# Patient Record
Sex: Female | Born: 1950
Health system: Southern US, Community
[De-identification: ages and names within clinical notes are randomized; demographics above are authoritative.]

## PROBLEM LIST (undated history)

## (undated) DIAGNOSIS — E785 Hyperlipidemia, unspecified: Secondary | ICD-10-CM

## (undated) DIAGNOSIS — G43909 Migraine, unspecified, not intractable, without status migrainosus: Secondary | ICD-10-CM

## (undated) DIAGNOSIS — I82409 Acute embolism and thrombosis of unspecified deep veins of unspecified lower extremity: Secondary | ICD-10-CM

## (undated) DIAGNOSIS — F32A Depression, unspecified: Secondary | ICD-10-CM

## (undated) DIAGNOSIS — IMO0002 Reserved for concepts with insufficient information to code with codable children: Secondary | ICD-10-CM

## (undated) DIAGNOSIS — I2699 Other pulmonary embolism without acute cor pulmonale: Secondary | ICD-10-CM

## (undated) DIAGNOSIS — F329 Major depressive disorder, single episode, unspecified: Secondary | ICD-10-CM

## (undated) DIAGNOSIS — D6851 Activated protein C resistance: Secondary | ICD-10-CM

## (undated) HISTORY — DX: Hyperlipidemia, unspecified: E78.5

## (undated) HISTORY — PX: KNEE ARTHROSCOPY: SUR90

## (undated) HISTORY — DX: Migraine, unspecified, not intractable, without status migrainosus: G43.909

## (undated) HISTORY — PX: DILATION AND CURETTAGE OF UTERUS: SHX78

## (undated) HISTORY — DX: Reserved for concepts with insufficient information to code with codable children: IMO0002

## (undated) HISTORY — DX: Major depressive disorder, single episode, unspecified: F32.9

## (undated) HISTORY — DX: Depression, unspecified: F32.A

## (undated) HISTORY — PX: TONSILLECTOMY: SUR1361

## (undated) HISTORY — PX: OTHER SURGICAL HISTORY: SHX169

---

## 1998-01-26 ENCOUNTER — Other Ambulatory Visit: Admission: RE | Admit: 1998-01-26 | Discharge: 1998-01-26 | Payer: Self-pay | Admitting: Obstetrics and Gynecology

## 1999-03-10 ENCOUNTER — Other Ambulatory Visit: Admission: RE | Admit: 1999-03-10 | Discharge: 1999-03-10 | Payer: Self-pay | Admitting: Obstetrics and Gynecology

## 2000-03-13 ENCOUNTER — Encounter: Admission: RE | Admit: 2000-03-13 | Discharge: 2000-03-13 | Payer: Self-pay | Admitting: Family Medicine

## 2000-03-13 ENCOUNTER — Encounter: Payer: Self-pay | Admitting: Family Medicine

## 2000-05-08 ENCOUNTER — Other Ambulatory Visit: Admission: RE | Admit: 2000-05-08 | Discharge: 2000-05-08 | Payer: Self-pay | Admitting: Obstetrics & Gynecology

## 2001-03-15 ENCOUNTER — Encounter: Payer: Self-pay | Admitting: Family Medicine

## 2001-03-15 ENCOUNTER — Encounter: Admission: RE | Admit: 2001-03-15 | Discharge: 2001-03-15 | Payer: Self-pay | Admitting: Family Medicine

## 2001-08-12 ENCOUNTER — Other Ambulatory Visit: Admission: RE | Admit: 2001-08-12 | Discharge: 2001-08-12 | Payer: Self-pay | Admitting: Obstetrics & Gynecology

## 2001-09-19 ENCOUNTER — Ambulatory Visit (HOSPITAL_COMMUNITY): Admission: RE | Admit: 2001-09-19 | Discharge: 2001-09-19 | Payer: Self-pay | Admitting: Obstetrics and Gynecology

## 2001-09-19 ENCOUNTER — Encounter (INDEPENDENT_AMBULATORY_CARE_PROVIDER_SITE_OTHER): Payer: Self-pay

## 2002-03-17 ENCOUNTER — Encounter: Admission: RE | Admit: 2002-03-17 | Discharge: 2002-03-17 | Payer: Self-pay | Admitting: Obstetrics and Gynecology

## 2002-03-17 ENCOUNTER — Encounter: Payer: Self-pay | Admitting: Obstetrics and Gynecology

## 2002-08-19 ENCOUNTER — Other Ambulatory Visit: Admission: RE | Admit: 2002-08-19 | Discharge: 2002-08-19 | Payer: Self-pay | Admitting: Obstetrics and Gynecology

## 2002-09-19 ENCOUNTER — Encounter (INDEPENDENT_AMBULATORY_CARE_PROVIDER_SITE_OTHER): Payer: Self-pay

## 2002-09-19 ENCOUNTER — Ambulatory Visit (HOSPITAL_COMMUNITY): Admission: RE | Admit: 2002-09-19 | Discharge: 2002-09-19 | Payer: Self-pay | Admitting: Obstetrics and Gynecology

## 2003-04-14 ENCOUNTER — Encounter: Admission: RE | Admit: 2003-04-14 | Discharge: 2003-04-14 | Payer: Self-pay | Admitting: Obstetrics and Gynecology

## 2003-04-14 ENCOUNTER — Encounter: Payer: Self-pay | Admitting: Obstetrics and Gynecology

## 2003-08-20 ENCOUNTER — Other Ambulatory Visit: Admission: RE | Admit: 2003-08-20 | Discharge: 2003-08-20 | Payer: Self-pay | Admitting: Obstetrics and Gynecology

## 2004-09-07 ENCOUNTER — Other Ambulatory Visit: Admission: RE | Admit: 2004-09-07 | Discharge: 2004-09-07 | Payer: Self-pay | Admitting: Obstetrics and Gynecology

## 2005-10-31 ENCOUNTER — Other Ambulatory Visit: Admission: RE | Admit: 2005-10-31 | Discharge: 2005-10-31 | Payer: Self-pay | Admitting: Obstetrics and Gynecology

## 2006-01-04 ENCOUNTER — Ambulatory Visit (HOSPITAL_COMMUNITY): Payer: Self-pay | Admitting: *Deleted

## 2006-04-10 ENCOUNTER — Ambulatory Visit (HOSPITAL_COMMUNITY): Payer: Self-pay | Admitting: *Deleted

## 2006-07-09 ENCOUNTER — Encounter: Payer: Self-pay | Admitting: Family Medicine

## 2006-08-06 ENCOUNTER — Encounter: Payer: Self-pay | Admitting: Family Medicine

## 2008-08-26 ENCOUNTER — Encounter (INDEPENDENT_AMBULATORY_CARE_PROVIDER_SITE_OTHER): Payer: Self-pay | Admitting: *Deleted

## 2008-09-30 ENCOUNTER — Ambulatory Visit: Payer: Self-pay | Admitting: Family Medicine

## 2008-09-30 DIAGNOSIS — F329 Major depressive disorder, single episode, unspecified: Secondary | ICD-10-CM

## 2008-09-30 DIAGNOSIS — E785 Hyperlipidemia, unspecified: Secondary | ICD-10-CM | POA: Insufficient documentation

## 2008-12-11 ENCOUNTER — Encounter: Payer: Self-pay | Admitting: Family Medicine

## 2008-12-11 ENCOUNTER — Encounter (INDEPENDENT_AMBULATORY_CARE_PROVIDER_SITE_OTHER): Payer: Self-pay | Admitting: *Deleted

## 2008-12-14 ENCOUNTER — Ambulatory Visit: Payer: Self-pay | Admitting: Family Medicine

## 2008-12-14 DIAGNOSIS — H6120 Impacted cerumen, unspecified ear: Secondary | ICD-10-CM | POA: Insufficient documentation

## 2008-12-14 DIAGNOSIS — I8000 Phlebitis and thrombophlebitis of superficial vessels of unspecified lower extremity: Secondary | ICD-10-CM | POA: Insufficient documentation

## 2008-12-15 ENCOUNTER — Telehealth (INDEPENDENT_AMBULATORY_CARE_PROVIDER_SITE_OTHER): Payer: Self-pay | Admitting: *Deleted

## 2008-12-15 LAB — CONVERTED CEMR LAB
ALT: 41 units/L — ABNORMAL HIGH (ref 0–35)
AST: 31 units/L (ref 0–37)
Albumin: 4.1 g/dL (ref 3.5–5.2)
Alkaline Phosphatase: 54 units/L (ref 39–117)
BUN: 19 mg/dL (ref 6–23)
Basophils Absolute: 0 10*3/uL (ref 0.0–0.1)
Basophils Relative: 0.2 % (ref 0.0–3.0)
Bilirubin, Direct: 0.1 mg/dL (ref 0.0–0.3)
CO2: 33 meq/L — ABNORMAL HIGH (ref 19–32)
Calcium: 9.8 mg/dL (ref 8.4–10.5)
Chloride: 106 meq/L (ref 96–112)
Cholesterol: 166 mg/dL (ref 0–200)
Creatinine, Ser: 0.7 mg/dL (ref 0.4–1.2)
Eosinophils Absolute: 0.1 10*3/uL (ref 0.0–0.7)
Eosinophils Relative: 1.7 % (ref 0.0–5.0)
GFR calc non Af Amer: 91.08 mL/min (ref >60)
Glucose, Bld: 98 mg/dL (ref 70–99)
HCT: 42.6 % (ref 36.0–46.0)
HDL: 54.3 mg/dL (ref >39.00)
Hemoglobin: 14.7 g/dL (ref 12.0–15.0)
LDL Cholesterol: 92 mg/dL (ref 0–99)
Lymphocytes Relative: 36.5 % (ref 12.0–46.0)
Lymphs Abs: 2.1 10*3/uL (ref 0.7–4.0)
MCHC: 34.4 g/dL (ref 30.0–36.0)
MCV: 92.9 fL (ref 78.0–100.0)
Monocytes Absolute: 0.4 10*3/uL (ref 0.1–1.0)
Monocytes Relative: 7 % (ref 3.0–12.0)
Neutro Abs: 3.1 10*3/uL (ref 1.4–7.7)
Neutrophils Relative %: 54.6 % (ref 43.0–77.0)
Platelets: 214 10*3/uL (ref 150.0–400.0)
Potassium: 4.7 meq/L (ref 3.5–5.1)
RBC: 4.58 M/uL (ref 3.87–5.11)
RDW: 12.3 % (ref 11.5–14.6)
Sodium: 142 meq/L (ref 135–145)
TSH: 2.57 microintl units/mL (ref 0.35–5.50)
Total Bilirubin: 0.9 mg/dL (ref 0.3–1.2)
Total CHOL/HDL Ratio: 3
Total Protein: 6.5 g/dL (ref 6.0–8.3)
Triglycerides: 98 mg/dL (ref 0.0–149.0)
VLDL: 19.6 mg/dL (ref 0.0–40.0)
WBC: 5.7 10*3/uL (ref 4.5–10.5)

## 2008-12-18 ENCOUNTER — Ambulatory Visit: Payer: Self-pay

## 2008-12-18 ENCOUNTER — Encounter: Payer: Self-pay | Admitting: Family Medicine

## 2008-12-24 ENCOUNTER — Telehealth (INDEPENDENT_AMBULATORY_CARE_PROVIDER_SITE_OTHER): Payer: Self-pay | Admitting: *Deleted

## 2008-12-28 ENCOUNTER — Ambulatory Visit: Payer: Self-pay | Admitting: Family Medicine

## 2008-12-28 ENCOUNTER — Telehealth (INDEPENDENT_AMBULATORY_CARE_PROVIDER_SITE_OTHER): Payer: Self-pay | Admitting: *Deleted

## 2008-12-30 ENCOUNTER — Encounter (INDEPENDENT_AMBULATORY_CARE_PROVIDER_SITE_OTHER): Payer: Self-pay | Admitting: *Deleted

## 2008-12-30 LAB — CONVERTED CEMR LAB
ALT: 37 units/L — ABNORMAL HIGH (ref 0–35)
AST: 29 units/L (ref 0–37)
Albumin: 3.8 g/dL (ref 3.5–5.2)
Alkaline Phosphatase: 54 units/L (ref 39–117)
Bilirubin, Direct: 0 mg/dL (ref 0.0–0.3)
Total Bilirubin: 0.7 mg/dL (ref 0.3–1.2)
Total Protein: 5.9 g/dL — ABNORMAL LOW (ref 6.0–8.3)

## 2009-01-29 ENCOUNTER — Telehealth (INDEPENDENT_AMBULATORY_CARE_PROVIDER_SITE_OTHER): Payer: Self-pay | Admitting: *Deleted

## 2009-04-24 ENCOUNTER — Ambulatory Visit: Payer: Self-pay | Admitting: Family Medicine

## 2009-06-04 ENCOUNTER — Telehealth (INDEPENDENT_AMBULATORY_CARE_PROVIDER_SITE_OTHER): Payer: Self-pay | Admitting: *Deleted

## 2009-06-10 ENCOUNTER — Ambulatory Visit: Payer: Self-pay | Admitting: Family Medicine

## 2009-06-10 DIAGNOSIS — R0982 Postnasal drip: Secondary | ICD-10-CM | POA: Insufficient documentation

## 2009-06-11 LAB — CONVERTED CEMR LAB
ALT: 31 units/L (ref 0–35)
AST: 25 units/L (ref 0–37)
Albumin: 4.1 g/dL (ref 3.5–5.2)
Alkaline Phosphatase: 46 units/L (ref 39–117)
Bilirubin, Direct: 0.1 mg/dL (ref 0.0–0.3)
Cholesterol: 140 mg/dL (ref 0–200)
HDL: 58.1 mg/dL (ref >39.00)
LDL Cholesterol: 75 mg/dL (ref 0–99)
Total Bilirubin: 0.8 mg/dL (ref 0.3–1.2)
Total CHOL/HDL Ratio: 2
Total Protein: 5.9 g/dL — ABNORMAL LOW (ref 6.0–8.3)
Triglycerides: 37 mg/dL (ref 0.0–149.0)
VLDL: 7.4 mg/dL (ref 0.0–40.0)

## 2009-10-12 ENCOUNTER — Telehealth (INDEPENDENT_AMBULATORY_CARE_PROVIDER_SITE_OTHER): Payer: Self-pay | Admitting: *Deleted

## 2009-12-17 ENCOUNTER — Telehealth (INDEPENDENT_AMBULATORY_CARE_PROVIDER_SITE_OTHER): Payer: Self-pay | Admitting: *Deleted

## 2009-12-19 LAB — CONVERTED CEMR LAB: Pap Smear: NORMAL

## 2009-12-19 LAB — HM MAMMOGRAPHY: HM Mammogram: NORMAL

## 2009-12-28 ENCOUNTER — Ambulatory Visit: Payer: Self-pay | Admitting: Family Medicine

## 2009-12-28 DIAGNOSIS — S93609A Unspecified sprain of unspecified foot, initial encounter: Secondary | ICD-10-CM | POA: Insufficient documentation

## 2010-01-14 ENCOUNTER — Telehealth (INDEPENDENT_AMBULATORY_CARE_PROVIDER_SITE_OTHER): Payer: Self-pay | Admitting: *Deleted

## 2010-01-19 ENCOUNTER — Telehealth (INDEPENDENT_AMBULATORY_CARE_PROVIDER_SITE_OTHER): Payer: Self-pay | Admitting: *Deleted

## 2010-02-08 ENCOUNTER — Encounter (INDEPENDENT_AMBULATORY_CARE_PROVIDER_SITE_OTHER): Payer: Self-pay | Admitting: *Deleted

## 2010-02-08 ENCOUNTER — Encounter: Payer: Self-pay | Admitting: Family Medicine

## 2010-02-08 LAB — CONVERTED CEMR LAB
ALT: 30 units/L
AST: 23 units/L
Albumin: 4.4 g/dL
Alkaline Phosphatase: 53 units/L
BUN: 20 mg/dL
CO2, serum: 27 mmol/L
Calcium: 9.6 mg/dL
Chloride, Serum: 104 mmol/L
Creatinine, Ser: 0.75 mg/dL
Glucose, Bld: 56 mg/dL
HCT: 44.4 %
Hemoglobin: 14.5 g/dL
MCH: 30.6 pg
MCV: 93.7 fL
Platelets: 203 10*3/uL
Potassium, serum: 4.6 mmol/L
RBC count: 4.74 10*6/uL
Sodium, serum: 141 mmol/L
TSH: 3.817 microintl units/mL
Total Bilirubin: 0.6 mg/dL
Total Protein: 6.2 g/dL
WBC, blood: 6.1 10*3/uL

## 2010-02-11 ENCOUNTER — Encounter (INDEPENDENT_AMBULATORY_CARE_PROVIDER_SITE_OTHER): Payer: Self-pay | Admitting: *Deleted

## 2010-02-15 ENCOUNTER — Ambulatory Visit: Payer: Self-pay | Admitting: Family Medicine

## 2010-02-17 LAB — CONVERTED CEMR LAB
ALT: 34 units/L (ref 0–35)
AST: 23 units/L (ref 0–37)
Albumin: 4.5 g/dL (ref 3.5–5.2)
Alkaline Phosphatase: 53 units/L (ref 39–117)
BUN: 20 mg/dL (ref 6–23)
Basophils Absolute: 0 10*3/uL (ref 0.0–0.1)
Basophils Relative: 0.6 % (ref 0.0–3.0)
Bilirubin, Direct: 0.1 mg/dL (ref 0.0–0.3)
CO2: 34 meq/L — ABNORMAL HIGH (ref 19–32)
Calcium: 9.7 mg/dL (ref 8.4–10.5)
Chloride: 106 meq/L (ref 96–112)
Cholesterol: 181 mg/dL (ref 0–200)
Creatinine, Ser: 0.7 mg/dL (ref 0.4–1.2)
Eosinophils Absolute: 0.2 10*3/uL (ref 0.0–0.7)
Eosinophils Relative: 2.9 % (ref 0.0–5.0)
Free T4: 0.87 ng/dL (ref 0.60–1.60)
GFR calc non Af Amer: 90.72 mL/min (ref >60)
Glucose, Bld: 91 mg/dL (ref 70–99)
HCT: 42.2 % (ref 36.0–46.0)
HDL: 61.8 mg/dL (ref >39.00)
Hemoglobin: 14.6 g/dL (ref 12.0–15.0)
LDL Cholesterol: 102 mg/dL — ABNORMAL HIGH (ref 0–99)
Lymphocytes Relative: 43.2 % (ref 12.0–46.0)
Lymphs Abs: 2.5 10*3/uL (ref 0.7–4.0)
MCHC: 34.7 g/dL (ref 30.0–36.0)
MCV: 92.1 fL (ref 78.0–100.0)
Monocytes Absolute: 0.5 10*3/uL (ref 0.1–1.0)
Monocytes Relative: 8.2 % (ref 3.0–12.0)
Neutro Abs: 2.6 10*3/uL (ref 1.4–7.7)
Neutrophils Relative %: 45.1 % (ref 43.0–77.0)
Platelets: 211 10*3/uL (ref 150.0–400.0)
Potassium: 4.6 meq/L (ref 3.5–5.1)
RBC: 4.58 M/uL (ref 3.87–5.11)
RDW: 12.8 % (ref 11.5–14.6)
Sodium: 144 meq/L (ref 135–145)
T3, Free: 2.5 pg/mL (ref 2.3–4.2)
TSH: 5.39 microintl units/mL (ref 0.35–5.50)
Total Bilirubin: 0.6 mg/dL (ref 0.3–1.2)
Total CHOL/HDL Ratio: 3
Total Protein: 6.7 g/dL (ref 6.0–8.3)
Triglycerides: 88 mg/dL (ref 0.0–149.0)
VLDL: 17.6 mg/dL (ref 0.0–40.0)
Vit D, 25-Hydroxy: 67 ng/mL (ref 30–89)
WBC: 5.7 10*3/uL (ref 4.5–10.5)

## 2010-02-18 LAB — HM COLONOSCOPY: HM Colonoscopy: NORMAL

## 2010-02-22 ENCOUNTER — Telehealth (INDEPENDENT_AMBULATORY_CARE_PROVIDER_SITE_OTHER): Payer: Self-pay | Admitting: *Deleted

## 2010-03-28 ENCOUNTER — Encounter: Payer: Self-pay | Admitting: Family Medicine

## 2010-04-15 ENCOUNTER — Encounter: Payer: Self-pay | Admitting: Family Medicine

## 2010-05-04 ENCOUNTER — Encounter: Payer: Self-pay | Admitting: Family Medicine

## 2010-05-06 ENCOUNTER — Ambulatory Visit: Payer: Self-pay | Admitting: Family Medicine

## 2010-05-06 DIAGNOSIS — M25569 Pain in unspecified knee: Secondary | ICD-10-CM | POA: Insufficient documentation

## 2010-05-09 ENCOUNTER — Ambulatory Visit: Payer: Self-pay | Admitting: Family Medicine

## 2010-05-09 ENCOUNTER — Encounter: Admission: RE | Admit: 2010-05-09 | Discharge: 2010-05-09 | Payer: Self-pay | Admitting: Family Medicine

## 2010-05-10 ENCOUNTER — Encounter: Payer: Self-pay | Admitting: Family Medicine

## 2010-05-10 ENCOUNTER — Encounter (INDEPENDENT_AMBULATORY_CARE_PROVIDER_SITE_OTHER): Payer: Self-pay | Admitting: *Deleted

## 2010-05-12 ENCOUNTER — Encounter: Payer: Self-pay | Admitting: Family Medicine

## 2010-05-16 ENCOUNTER — Encounter (INDEPENDENT_AMBULATORY_CARE_PROVIDER_SITE_OTHER): Payer: Self-pay | Admitting: *Deleted

## 2010-05-16 ENCOUNTER — Encounter: Admission: RE | Admit: 2010-05-16 | Discharge: 2010-05-16 | Payer: Self-pay | Admitting: Family Medicine

## 2010-05-16 DIAGNOSIS — IMO0002 Reserved for concepts with insufficient information to code with codable children: Secondary | ICD-10-CM | POA: Insufficient documentation

## 2010-05-19 ENCOUNTER — Encounter: Payer: Self-pay | Admitting: Family Medicine

## 2010-09-20 NOTE — Assessment & Plan Note (Signed)
Summary: bruised ankle & swollen/cbs   Vital Signs:  Patient profile:   60 year old female Height:      63.50 inches Weight:      138 pounds BMI:     24.15 Pulse rate:   72 / minute Pulse rhythm:   regular BP sitting:   116 / 80  (left arm) Cuff size:   regular  Vitals Entered By: Army Fossa CMA (Dec 28, 2009 2:55 PM) CC: Top of pts foot and ankle is bruised and swollen, hurt it yesterday while walking her dog   History of Present Illness:  Injury      This is a 60 year old woman who presents with An injury.  The symptoms began 1 day ago.  Pt fell yesterday while walking dog.  Her Renette [place] got scared and pulled her.   Pain with weight bearing.  The patient reports injury to the right foot.  The patient also reports swelling, redness, tenderness, and increased warmth deformity.  The patient denies blood loss, numbness, weakness, and loss of sensation.  Risk factors for significant bleeding include aspirin use.  The patient denies the following risk factors for significant bleeding: anticoagulant use and history of bleeding disorder.  Screening for risk of abuse was negative.    Allergies (verified): No Known Drug Allergies  Physical Exam  General:  Well-developed,well-nourished,in no acute distress; alert,appropriate and cooperative throughout examination Msk:  R foot-- + errythema and swelling lat mall and foot just minimal tenderness with palpation Pt able to move toes and flex foot with only minimal pain Psych:  Oriented X3 and good eye contact.     Impression & Recommendations:  Problem # 1:  FOOT SPRAIN, RIGHT (ICD-845.10)  Her updated medication list for this problem includes:    Aspirin 81 Mg Tbec (Aspirin) .Marland Kitchen... Take one tablet daily  Orders: T-Foot Right (73630TC) Ace Wraps 3-5 in/yard  (V4098) EMR Misc Charge Code [facility])  Instructed to use a compression wrap, elevate the affected area, apply ICE for 20 minutes every hour while awake for next 3 days, and  rest. Start physical therapy as directed and recheck in 10-14 days if no improvement, sooner if worse.  Complete Medication List: 1)  Alprazolam 0.5 Mg Tabs (Alprazolam) .... Take one tablet qid as needed 2)  Simvastatin 40 Mg Tabs (Simvastatin) .... Take one tablet at bedtime 3)  Citalopram Hydrobromide 40 Mg Tabs (Citalopram hydrobromide) .... Take one tablet daily 4)  Biotin 1000 Mcg Tabs (Biotin) .... Take one tablet daily 5)  Aspirin 81 Mg Tbec (Aspirin) .... Take one tablet daily 6)  Multivitamins Tabs (Multiple vitamin) .Marland Kitchen.. 1 by mouth once daily 7)  Calcium 600 1500 Mg Tabs (Calcium carbonate) .Marland Kitchen.. 1 by mouth two times a day 8)  Nasonex 50 Mcg/act Susp (Mometasone furoate) .... 2 sprays each nostril once daily

## 2010-09-20 NOTE — Progress Notes (Signed)
Summary: simvastatin refill   Phone Note Refill Request Message from:  Fax from Pharmacy on October 12, 2009 4:30 PM  Refills Requested: Medication #1:  SIMVASTATIN 40 MG TABS take one tablet at bedtime [facility] fax [phone]   Method Requested: Fax to Local Pharmacy Next Appointment Scheduled: no appt Initial call taken by: Barb Merino,  October 12, 2009 4:31 PM    Prescriptions: SIMVASTATIN 40 MG TABS (SIMVASTATIN) take one tablet at bedtime  #30 x 2   Entered by:   Doristine Devoid   Authorized by:   Neena Rhymes MD   Signed by:   Doristine Devoid on 10/13/2009   Method used:   Electronically to        [facility] [phone]* (retail)       [address]       [place], [place]  [address]       Ph: [phone] or [phone]       Fax: [phone]   RxID:   [id number]

## 2010-09-20 NOTE — Miscellaneous (Signed)
Summary: [facility]  [facility]   Imported By: Marily Memos 05/30/2010 11:33:26  _____________________________________________________________________  External Attachment:    Type:   Image     Comment:   External Document

## 2010-09-20 NOTE — Progress Notes (Signed)
Summary: Refill Request  Phone Note Refill Request Call back at [phone] Message from:  Pharmacy on December 17, 2009 9:10 AM  Refills Requested: Medication #1:  NASONEX 50 MCG/ACT SUSP 2 sprays each nostril once daily.   Dosage confirmed as above?Dosage Confirmed   Last Refilled: 06/12/2009 [facility] [employer]  Next Appointment Scheduled: 5.17.11 Initial call taken by: Harold Barban,  December 17, 2009 9:10 AM    Prescriptions: NASONEX 50 MCG/ACT SUSP (MOMETASONE FUROATE) 2 sprays each nostril once daily  #1 x 3   Entered by:   Kandice Hams   Authorized by:   Neena Rhymes MD   Signed by:   Kandice Hams on 12/17/2009   Method used:   Faxed to ...       [facility] [phone]* (retail)       [address]       [place], [place]  [address]       Ph: [phone] or [phone]       Fax: [phone]   RxID:   [id number]

## 2010-09-20 NOTE — Letter (Signed)
Summary: Amy Perkins at [facility] [employer]   Imported By: Marily Memos 05/09/2010 12:19:03  _____________________________________________________________________  External Attachment:    Type:   Image     Comment:   External Document

## 2010-09-20 NOTE — Assessment & Plan Note (Signed)
Summary: knee pain/cbs   Vital Signs:  Patient profile:   60 year old female Height:      63.50 inches Weight:      137 pounds Pulse rate:   68 / minute Resp:     16 per minute BP sitting:   118 / 66  (left arm)  Vitals Entered By: Jeremy Johann CMA (May 06, 2010 9:29 AM) CC: pain left knee x3week, injured running   History of Present Illness: 60 yo woman here today for L knee pain.  sxs started 3 weeks ago after running 7 miles- 1st time on pavement.  has been icing daily.  rested knee for a week- sxs returned when she resumed exercising.  pain is anterior, medial, and posterior.  wearing neoprene sleeve now at work- 'it seemed to help a little bit'.  pain is worse going down stairs.  Current Medications (verified): 1)  Alprazolam 0.5 Mg Tabs (Alprazolam) .... Take One Tablet Qid As Needed 2)  Simvastatin 40 Mg Tabs (Simvastatin) .... Take One Tablet At Bedtime 3)  Citalopram Hydrobromide 40 Mg Tabs (Citalopram Hydrobromide) .... Take One Tablet Daily 4)  Biotin 1000 Mcg Tabs (Biotin) .... Take One Tablet Daily 5)  Aspirin 81 Mg Tbec (Aspirin) .... Take One Tablet Daily 6)  Multivitamins  Tabs (Multiple Vitamin) .Marland Kitchen.. 1 By Mouth Once Daily 7)  Calcium 600 1500 Mg Tabs (Calcium Carbonate) .Marland Kitchen.. 1 By Mouth Two Times A Day 8)  Nasonex 50 Mcg/act Susp (Mometasone Furoate) .... 2 Sprays Each Nostril Once Daily  Allergies (verified): No Known Drug Allergies  Review of Systems      See HPI  Physical Exam  General:  Well-developed,well-nourished,in no acute distress; alert,appropriate and cooperative throughout examination Msk:  L knee mildly swollen + quad inhibition test mild crepitus w/ patellar grind + TTP along medial joint line good flexion and extension Pulses:  +2 DP/PT Extremities:  no clubbing or cyanosis   Impression & Recommendations:  Problem # 1:  KNEE PAIN, LEFT (ICD-719.46) Assessment New start scheduled NSAIDs.  ice.  avoid running but should remain  active by biking.  refer to sports med for complete evaluation as pt would like to continue training for her 1/2 marathon. Her updated medication list for this problem includes:    Aspirin 81 Mg Tbec (Aspirin) .Marland Kitchen... Take one tablet daily  Orders: Sports Medicine (Sports Med)  Complete Medication List: 1)  Alprazolam 0.5 Mg Tabs (Alprazolam) .... Take one tablet qid as needed 2)  Simvastatin 40 Mg Tabs (Simvastatin) .... Take one tablet at bedtime 3)  Citalopram Hydrobromide 40 Mg Tabs (Citalopram hydrobromide) .... Take one tablet daily 4)  Biotin 1000 Mcg Tabs (Biotin) .... Take one tablet daily 5)  Aspirin 81 Mg Tbec (Aspirin) .... Take one tablet daily 6)  Multivitamins Tabs (Multiple vitamin) .Marland Kitchen.. 1 by mouth once daily 7)  Calcium 600 1500 Mg Tabs (Calcium carbonate) .Marland Kitchen.. 1 by mouth two times a day 8)  Nasonex 50 Mcg/act Susp (Mometasone furoate) .... 2 sprays each nostril once daily  Other Orders: Admin 1st Vaccine (34742) Flu Vaccine 5yrs + (59563)  Patient Instructions: 1)  Someone will call you with your sports med appt 2)  Continue your Aleve- 2 pills two times a day.  Take w/ food to avoid upset stomach 3)  Heat before activity, Ice after (Dixie Cup massage) 4)  Wear your knee brace while active 5)  Try and bike or elliptical as able- stop if there is pain 6)  Hang in there!!! Flu Vaccine Consent Questions     Do you have a history of severe allergic reactions to this vaccine? no    Any prior history of allergic reactions to egg and/or gelatin? no    Do you have a sensitivity to the preservative Thimersol? no    Do you have a past history of Guillan-Barre Syndrome? no    Do you currently have an acute febrile illness? no    Have you ever had a severe reaction to latex? no    Vaccine information given and explained to patient? yes    Are you currently pregnant? no    Lot Number:[id number]   Exp Date:02/18/2011   Site Given  Right Deltoid IMlu

## 2010-09-20 NOTE — Letter (Signed)
Summary: Cancer Screening/[facility] Risk Profile  Cancer Screening/[facility] Risk Profile   Imported By: Lanelle Bal 02/24/2010 08:47:30  _____________________________________________________________________  External Attachment:    Type:   Image     Comment:   External Document

## 2010-09-20 NOTE — Progress Notes (Signed)
Summary: ultrasound results  Phone Note Outgoing Call   Call placed by: Doristine Devoid,  Dec 24, 2008 4:19 PM Call placed to: Patient Summary of Call: no evidence of clot- inflammation of vein.  continue anti-inflammatories as discussed  Follow-up for Phone Call        left message on machine .........Marland KitchenDoristine Devoid  Dec 24, 2008 4:19 PM   left message on machine ............Marland KitchenDoristine Devoid  Dec 25, 2008 2:34 PM   left detailed msg on patient cell phone and if anymore questions ok to give office a callback................Marland KitchenDoristine Devoid  Dec 25, 2008 2:35 PM

## 2010-09-20 NOTE — Assessment & Plan Note (Signed)
Summary: cpx//pt will be fasting//lch   Vital Signs:  Patient profile:   60 year old female Height:      63.50 inches Weight:      136 pounds BMI:     23.80 Pulse rate:   59 / minute BP sitting:   108 / 68  (left arm)  Vitals Entered By: Doristine Devoid (February 15, 2010 8:30 AM) CC: cpx and labs   History of Present Illness: 60 yo woman here today for CPE.  pap and mammogram done May 2011.  has colonoscopy scheduled for Aug.  no concerns today.  Preventive Screening-Counseling & Management  Alcohol-Tobacco     Alcohol drinks/day: 1     Alcohol type: wine     Smoking Status: never  Caffeine-Diet-Exercise     Does Patient Exercise: yes     Type of exercise: marathon training      Drug Use:  never.    Problems Prior to Update: 1)  Foot Sprain, Right  (ICD-845.10) 2)  Postnasal Drip  (ICD-784.91) 3)  Cerumen Impaction, Left  (ICD-380.4) 4)  Phlebitis&thrombophleb Sup Vessels Lower Extrem  (ICD-451.0) 5)  Healthy Adult Female  (ICD-V70.0) 6)  Need Prophylactic Vaccination&inoculation Flu  (ICD-V04.81) 7)  Hyperlipidemia  (ICD-272.4) 8)  Depression  (ICD-311)  Current Medications (verified): 1)  Alprazolam 0.5 Mg Tabs (Alprazolam) .... Take One Tablet Qid As Needed 2)  Simvastatin 40 Mg Tabs (Simvastatin) .... Take One Tablet At Bedtime 3)  Citalopram Hydrobromide 40 Mg Tabs (Citalopram Hydrobromide) .... Take One Tablet Daily 4)  Biotin 1000 Mcg Tabs (Biotin) .... Take One Tablet Daily 5)  Aspirin 81 Mg Tbec (Aspirin) .... Take One Tablet Daily 6)  Multivitamins  Tabs (Multiple Vitamin) .Marland Kitchen.. 1 By Mouth Once Daily 7)  Calcium 600 1500 Mg Tabs (Calcium Carbonate) .Marland Kitchen.. 1 By Mouth Two Times A Day 8)  Nasonex 50 Mcg/act Susp (Mometasone Furoate) .... 2 Sprays Each Nostril Once Daily  Allergies (verified): No Known Drug Allergies  Past History:  Past Medical History: Last updated: 09/30/2008 Depression Hyperlipidemia  Past Surgical History: Last updated:  09/30/2008 laser vein surgery tonsillectomy 2 D&Cs  Family History: Last updated: 09/30/2008 CAD-father HTN-mother DM-mother STROKE-mother,father COLON CA-no BREAST CA-no  Social History: Last updated: 09/30/2008 divorced mother of 2- daughter and son locally no tobacco, 1-2 glasses of wine daily, no drugs  Family History: Reviewed history from 09/30/2008 and no changes required. CAD-father HTN-mother DM-mother STROKE-mother,father COLON CA-no BREAST CA-no  Review of Systems  The patient denies anorexia, fever, weight loss, weight gain, vision loss, decreased hearing, hoarseness, chest pain, syncope, dyspnea on exertion, peripheral edema, prolonged cough, headaches, abdominal pain, melena, hematochezia, severe indigestion/heartburn, hematuria, suspicious skin lesions, depression, abnormal bleeding, enlarged lymph nodes, and breast masses.    Physical Exam  General:  Well-developed,well-nourished,in no acute distress; alert,appropriate and cooperative throughout examination Head:  Normocephalic and atraumatic without obvious abnormalities. No apparent alopecia or balding. Eyes:  No corneal or conjunctival inflammation noted. EOMI. Perrla. Funduscopic exam benign, without hemorrhages, exudates or papilledema. Vision grossly normal. Ears:  External ear exam shows no significant lesions or deformities.  Otoscopic examination reveals clear canals, tympanic membranes are intact bilaterally without bulging, retraction, inflammation or discharge. Hearing is grossly normal bilaterally. Nose:  External nasal examination shows no deformity or inflammation. Nasal mucosa are pink and moist without lesions or exudates. Mouth:  Oral mucosa and oropharynx without lesions or exudates.  Teeth in good repair. Neck:  No deformities, masses, or tenderness noted. Breasts:  deferred to gyn Lungs:  Normal respiratory effort, chest expands symmetrically. Lungs are clear to auscultation, no crackles  or wheezes. Heart:  Normal rate and regular rhythm. S1 and S2 normal without gallop, murmur, click, rub or other extra sounds. Abdomen:  soft, NT/ND, +BS Genitalia:  deferred to gyn Msk:  No deformity or scoliosis noted of thoracic or lumbar spine.   Pulses:  +2 carotid, radial, DP Extremities:  no C/C/E Neurologic:  No cranial nerve deficits noted. Station and gait are normal. Plantar reflexes are down-going bilaterally. DTRs are symmetrical throughout. Sensory, motor and coordinative functions appear intact. Skin:  Intact without suspicious lesions or rashes Cervical Nodes:  No lymphadenopathy noted Axillary Nodes:  No palpable lymphadenopathy Inguinal Nodes:  No significant adenopathy Psych:  Cognition and judgment appear intact. Alert and cooperative with normal attention span and concentration. No apparent delusions, illusions, hallucinations   Impression & Recommendations:  Problem # 1:  HEALTHY ADULT FEMALE (ICD-V70.0) Assessment Unchanged PE WNL.  check labs.  UTD on health maintainence.  anticipatory guidance provided. Orders: Venipuncture (47829) TLB-BMP (Basic Metabolic Panel-BMET) (80048-METABOL) TLB-CBC Platelet - w/Differential (85025-CBCD) TLB-TSH (Thyroid Stimulating Hormone) (84443-TSH) T-Vitamin D (25-Hydroxy) ([address]213-[address]657)  Complete Medication List: 1)  Alprazolam 0.5 Mg Tabs (Alprazolam) .... Take one tablet qid as needed 2)  Simvastatin 40 Mg Tabs (Simvastatin) .... Take one tablet at bedtime 3)  Citalopram Hydrobromide 40 Mg Tabs (Citalopram hydrobromide) .... Take one tablet daily 4)  Biotin 1000 Mcg Tabs (Biotin) .... Take one tablet daily 5)  Aspirin 81 Mg Tbec (Aspirin) .... Take one tablet daily 6)  Multivitamins Tabs (Multiple vitamin) .Marland Kitchen.. 1 by mouth once daily 7)  Calcium 600 1500 Mg Tabs (Calcium carbonate) .Marland Kitchen.. 1 by mouth two times a day 8)  Nasonex 50 Mcg/act Susp (Mometasone furoate) .... 2 sprays each nostril once daily  Other Orders: TLB-Lipid  Panel (80061-LIPID) TLB-Hepatic/Liver Function Pnl (80076-HEPATIC)  Patient Instructions: 1)  Follow up in 6 months to recheck your cholesterol 2)  Keep up the good work on diet and exercise- your exam looks great! 3)  We'll notify you of your lab results 4)  Call with any questions or concerns 5)  Have a great summer! 6)  Happy [place] Birthday!   Preventive Care Screening  Mammogram:    Date:  12/19/2009    Results:  normal   Pap Smear:    Date:  12/19/2009    Results:  normal

## 2010-09-20 NOTE — Miscellaneous (Signed)
Summary: Vaccine Record/[facility]  Vaccine Record/[facility]   Imported By: Lanelle Bal 12/16/2008 11:01:12  _____________________________________________________________________  External Attachment:    Type:   Image     Comment:   External Document

## 2010-09-20 NOTE — Progress Notes (Signed)
Summary: nasonex refill   Phone Note Refill Request Call back at [phone] Message from:  Pharmacy on January 19, 2010 9:37 AM  Refills Requested: Medication #1:  NASONEX 50 MCG/ACT SUSP 2 sprays each nostril once daily.   Dosage confirmed as above?Dosage Confirmed [facility] [employer]  Next Appointment Scheduled: 6.28.11 Initial call taken by: Harold Barban,  January 19, 2010 9:37 AM    Prescriptions: NASONEX 50 MCG/ACT SUSP (MOMETASONE FUROATE) 2 sprays each nostril once daily  #1 x 3   Entered by:   Doristine Devoid   Authorized by:   Neena Rhymes MD   Signed by:   Doristine Devoid on 01/19/2010   Method used:   Electronically to        [facility] [phone]* (retail)       [address]       [place], [place]  [address]       Ph: [phone] or [phone]       Fax: [phone]   RxID:   [id number]

## 2010-09-20 NOTE — Procedures (Signed)
Summary: Colonoscopy/[facility]  Colonoscopy/[facility]   Imported By: Lanelle Bal 04/07/2010 11:23:47  _____________________________________________________________________  External Attachment:    Type:   Image     Comment:   External Document

## 2010-09-20 NOTE — Assessment & Plan Note (Signed)
Summary: 6 MTH CHECKUP FOR CHOLESTEROL/[facility]   Vital Signs:  Patient profile:   60 year old female Weight:      137.6 pounds Pulse rate:   66 / minute BP sitting:   114 / 60  (left arm)  Vitals Entered By: Doristine Devoid (June 10, 2009 9:16 AM)  History of Present Illness: 60 yo woman here today for  1) Hyperlipidemia- taking Simvastatin w/out difficulty.  no N/V, myalgias.  2) L ear cerumen impaction- has been using Debrox w/out relief.  would like ears irrigated.  3) PND- pt reports sxs are most bothersome when she wakes up.  not using anything for allergies.  only mild nasal congestion.  no ear pain, sore throat.  Problems Prior to Update: 1)  Postnasal Drip  (ICD-784.91) 2)  Cerumen Impaction, Left  (ICD-380.4) 3)  Phlebitis&thrombophleb Sup Vessels Lower Extrem  (ICD-451.0) 4)  Healthy Adult Female  (ICD-V70.0) 5)  Need Prophylactic Vaccination&inoculation Flu  (ICD-V04.81) 6)  Hyperlipidemia  (ICD-272.4) 7)  Depression  (ICD-311)  Current Medications (verified): 1)  Alprazolam 0.5 Mg Tabs (Alprazolam) .... Take One Tablet Qid As Needed 2)  Simvastatin 40 Mg Tabs (Simvastatin) .... Take One Tablet At Bedtime 3)  Citalopram Hydrobromide 40 Mg Tabs (Citalopram Hydrobromide) .... Take One Tablet Daily 4)  Biotin 1000 Mcg Tabs (Biotin) .... Take One Tablet Daily 5)  Aspirin 81 Mg Tbec (Aspirin) .... Take One Tablet Daily 6)  Multivitamins  Tabs (Multiple Vitamin) .Marland Kitchen.. 1 By Mouth Once Daily 7)  Calcium 600 1500 Mg Tabs (Calcium Carbonate) .Marland Kitchen.. 1 By Mouth Two Times A Day 8)  Nasonex 50 Mcg/act Susp (Mometasone Furoate) .... 2 Sprays Each Nostril Once Daily  Allergies (verified): No Known Drug Allergies  Past History:  Past Medical History: Last updated: 09/30/2008 Depression Hyperlipidemia  Review of Systems General:  Denies chills, fatigue, fever, and malaise. Eyes:  Denies blurring, double vision, and itching. ENT:  Complains of nasal congestion and postnasal  drainage. GI:  Denies abdominal pain, nausea, and vomiting. MS:  Denies muscle aches.  Physical Exam  General:  Well-developed,well-nourished,in no acute distress; alert,appropriate and cooperative throughout examination Head:  Normocephalic and atraumatic without obvious abnormalities. No apparent alopecia or balding. Eyes:  no injxn or inflammation Ears:  L ear initially full of wax, able to completely visualize TM after irrigation R TM WNL Nose:  mild congestion, minimal turbinate edema Mouth:  Oral mucosa and oropharynx without lesions or exudates.  Teeth in good repair.  extensive PND Lungs:  Normal respiratory effort, chest expands symmetrically. Lungs are clear to auscultation, no crackles or wheezes. Heart:  Normal rate and regular rhythm. S1 and S2 normal without gallop, murmur, click, rub or other extra sounds. Abdomen:  soft, NT/ND, +BS Pulses:  +2 carotid, radial, DP Extremities:  no C/C/E   Impression & Recommendations:  Problem # 1:  HYPERLIPIDEMIA (ICD-272.4) Assessment Unchanged pt tolerating meds w/out difficulty.  due for labs.  will make adjustments as needed. Her updated medication list for this problem includes:    Simvastatin 40 Mg Tabs (Simvastatin) .Marland Kitchen... Take one tablet at bedtime  Orders: Venipuncture (82956) TLB-Lipid Panel (80061-LIPID) TLB-Hepatic/Liver Function Pnl (80076-HEPATIC)  Problem # 2:  CERUMEN IMPACTION, LEFT (ICD-380.4) Assessment: Unchanged pt's L ear wax removed w/ irrigation after unsuccessful home tx.  Problem # 3:  POSTNASAL DRIP (ICD-784.91) Assessment: New start nasal steroid spray to decrease PND and nasal congestion.  Pt expresses understanding and is in agreement w/ this plan.  Complete Medication List:  1)  Alprazolam 0.5 Mg Tabs (Alprazolam) .... Take one tablet qid as needed 2)  Simvastatin 40 Mg Tabs (Simvastatin) .... Take one tablet at bedtime 3)  Citalopram Hydrobromide 40 Mg Tabs (Citalopram hydrobromide) .... Take  one tablet daily 4)  Biotin 1000 Mcg Tabs (Biotin) .... Take one tablet daily 5)  Aspirin 81 Mg Tbec (Aspirin) .... Take one tablet daily 6)  Multivitamins Tabs (Multiple vitamin) .Marland Kitchen.. 1 by mouth once daily 7)  Calcium 600 1500 Mg Tabs (Calcium carbonate) .Marland Kitchen.. 1 by mouth two times a day 8)  Nasonex 50 Mcg/act Susp (Mometasone furoate) .... 2 sprays each nostril once daily  Patient Instructions: 1)  Please schedule a follow-up appointment in 6 months for your complete physical. 2)  Use the nasal spray daily to decrease post nasal drip 3)  Add Claritin or Zyrtec as needed for seasonal allergies 4)  We'll notify you of your lab results 5)  Have a great holiday season!  Prescriptions: NASONEX 50 MCG/ACT SUSP (MOMETASONE FUROATE) 2 sprays each nostril once daily  #1 x 3   Entered and Authorized by:   Neena Rhymes MD   Signed by:   Neena Rhymes MD on 06/10/2009   Method used:   Electronically to        [facility] [phone]* (retail)       [address]       [place], [place]  [address]       Ph: [phone] or [phone]       Fax: [phone]   RxID:   [id number]

## 2010-09-20 NOTE — Letter (Signed)
Summary: Results Follow up Letter  [facility]  [address] [place], [place] [address]   Phone: [phone]  Fax: [phone]    12/30/2008 MRN: [id number]  Amy Perkins [address] [place], [place]  [address]  Dear [name],  The following are the results of your recent test(s):  Test         Result    Pap Smear:        Normal _____  Not Normal _____ Comments: ______________________________________________________ Cholesterol: LDL(Bad cholesterol):         Your goal is less than:         HDL (Good cholesterol):       Your goal is more than: Comments:  ______________________________________________________ Mammogram:        Normal _____  Not Normal _____ Comments:  ___________________________________________________________________ Hemoccult:        Normal _____  Not normal _______ Comments:    _____________________________________________________________________ Other Tests: PLEASE SEE COPY OF LABS FROM 12/28/08- ALT trending down- no need to repeat.  will follow with regular labs.     We routinely do not discuss normal results over the telephone.  If you desire a copy of the results, or you have any questions about this information we can discuss them at your next office visit.   Sincerely,

## 2010-09-20 NOTE — Letter (Signed)
Summary: External Other  External Other   Imported By: Marily Memos 05/16/2010 13:33:57  _____________________________________________________________________  External Attachment:    Type:   Image     Comment:   External Document

## 2010-09-20 NOTE — Letter (Signed)
Summary: [facility]   Imported By: Lanelle Bal 05/03/2010 08:00:38  _____________________________________________________________________  External Attachment:    Type:   Image     Comment:   External Document

## 2010-09-20 NOTE — Letter (Signed)
Summary: [facility]   Imported By: Lanelle Bal 05/25/2010 08:34:04  _____________________________________________________________________  External Attachment:    Type:   Image     Comment:   External Document

## 2010-09-20 NOTE — Miscellaneous (Signed)
Summary: MRI appt  MRI lt knee appt 05/16/10 @ 8:30am. Pt notified of appt. Rochele Pages, RN

## 2010-09-20 NOTE — Progress Notes (Signed)
Summary: simvastatin refill   Phone Note Refill Request Call back at [phone] Message from:  Pharmacy on Jan 14, 2010 3:17 PM  Refills Requested: Medication #1:  SIMVASTATIN 40 MG TABS take one tablet at bedtime   Dosage confirmed as above?Dosage Confirmed   Supply Requested: 1 month   Last Refilled: 10/13/2009   Notes: 2 refills [facility] [employer] [address] [employer]  Next Appointment Scheduled: 6.28.11 Initial call taken by: Harold Barban,  Jan 14, 2010 3:18 PM    Prescriptions: SIMVASTATIN 40 MG TABS (SIMVASTATIN) take one tablet at bedtime  #30 x 0   Entered by:   Doristine Devoid   Authorized by:   Neena Rhymes MD   Signed by:   Doristine Devoid on 01/14/2010   Method used:   Electronically to        [facility] [phone]* (retail)       [address]       [place], [place]  [address]       Ph: [phone] or [phone]       Fax: [phone]   RxID:   [id number]

## 2010-09-20 NOTE — Letter (Signed)
Summary: *Consult Note  [facility]  [address]   [place], [place] [address]   Phone: [phone]  Fax: [phone]    Re:    Amy Perkins DOB:    07/29/1951   Dear:    Danae Chen you for requesting that we see the above patient for consultation.  A copy of the detailed office note will be sent under separate cover, for your review.  Evaluation today is consistent with: left knee pain, likely degenerative meniscal injury given her fairly normal x rays.  Our recommendation is for:  Will proceed with MRI.   New Orders include:MRI    New Medications started today include: none new   After today's visit, the patients current medications include: 1)  ALPRAZOLAM 0.5 MG TABS (ALPRAZOLAM) take one tablet qid as needed 2)  SIMVASTATIN 40 MG TABS (SIMVASTATIN) take one tablet at bedtime 3)  CITALOPRAM HYDROBROMIDE 40 MG TABS (CITALOPRAM HYDROBROMIDE) take one tablet daily 4)  BIOTIN 1000 MCG TABS (BIOTIN) take one tablet daily 5)  ASPIRIN 81 MG TBEC (ASPIRIN) take one tablet daily 6)  MULTIVITAMINS  TABS (MULTIPLE VITAMIN) 1 by mouth once daily 7)  CALCIUM 600 1500 MG TABS (CALCIUM CARBONATE) 1 by mouth two times a day 8)  NASONEX 50 MCG/ACT SUSP (MOMETASONE FUROATE) 2 sprays each nostril once daily   Thank you for this consultation.  If you have any further questions regarding the care of this patient, please do not hesitate to contact me @   Thank you for this opportunity to look after your patient.  Sincerely,   Denny Levy MD

## 2010-09-20 NOTE — Progress Notes (Signed)
Summary: simvastatin refill   Phone Note Refill Request Call back at [phone] Message from:  Pharmacy on February 22, 2010 11:48 AM  Refills Requested: Medication #1:  SIMVASTATIN 40 MG TABS take one tablet at bedtime   Dosage confirmed as above?Dosage Confirmed   Supply Requested: 1 month   Last Refilled: 01/14/2010 [facility]  Next Appointment Scheduled: NONE Initial call taken by: Lavell Islam,  February 22, 2010 11:49 AM    Prescriptions: SIMVASTATIN 40 MG TABS (SIMVASTATIN) take one tablet at bedtime  #30 x 6   Entered by:   Doristine Devoid   Authorized by:   Neena Rhymes MD   Signed by:   Doristine Devoid on 02/22/2010   Method used:   Electronically to        [facility] [phone]* (retail)       [address]       [place], [place]  [address]       Ph: [phone] or [phone]       Fax: [phone]   RxID:   [id number]

## 2010-09-20 NOTE — Assessment & Plan Note (Signed)
Summary: 1:30 APPT,NP,KNEE PAIN,TRAINING FOR HALF MARATHON,MC   Vital Signs:  Patient profile:   60 year old female BP sitting:   119 / 77  Vitals Entered By: Lillia Pauls CMA (May 09, 2010 1:41 PM)  Primary Care Provider:  Neena Rhymes MD   History of Present Illness:  Patient here for further evaluation and treatment of left knee pain: PCP DR Beverely Low.  1. Knee pain:  Pt presents to clinic today for evaluation of left knee pain.  It has been there for the past 2 weeks and it was worse after a 7 mile run.  She didn't remember any particular injury to the knee.  She did hurt it about 6 months ago when she was bending down at her job but it had been fine up until recently.  She thinks that it is related to increasing her mileage while training for a half marathon.  She has noticed it to be swollen at times.  She also thinks that it locks and she is not able to fully extend the knee.  She has not been running for the past couple of weeks but the knee is still painful.  Pain is located around the patella, at the medial joint, and in the posterior joint.  PERTINENT PMH/PSH: no prior knee injury or surgery except as in HPI non smoker no histroy of rheumatoid arthritis not diabetic  Current Medications (verified): 1)  Alprazolam 0.5 Mg Tabs (Alprazolam) .... Take One Tablet Qid As Needed 2)  Simvastatin 40 Mg Tabs (Simvastatin) .... Take One Tablet At Bedtime 3)  Citalopram Hydrobromide 40 Mg Tabs (Citalopram Hydrobromide) .... Take One Tablet Daily 4)  Biotin 1000 Mcg Tabs (Biotin) .... Take One Tablet Daily 5)  Aspirin 81 Mg Tbec (Aspirin) .... Take One Tablet Daily 6)  Multivitamins  Tabs (Multiple Vitamin) .Marland Kitchen.. 1 By Mouth Once Daily 7)  Calcium 600 1500 Mg Tabs (Calcium Carbonate) .Marland Kitchen.. 1 By Mouth Two Times A Day 8)  Nasonex 50 Mcg/act Susp (Mometasone Furoate) .... 2 Sprays Each Nostril Once Daily  Allergies: No Known Drug Allergies  Past History:  Past Surgical  History: Last updated: 09/30/2008 laser vein surgery tonsillectomy 2 D&Cs  Social History: Last updated: 09/30/2008 divorced mother of 2- daughter and son locally no tobacco, 1-2 glasses of wine daily, no drugs  Past Medical History: Reviewed history from 09/30/2008 and no changes required. Depression Hyperlipidemia  Review of Systems  The patient denies fever.         No leg or foot numbness or weakness.  No hip or back pains associated with knee pain.Please see HPI for additional ROS.   Physical Exam  General:  Well appearing, no acute distress Msk:  Left knee:  TTP with patellar compression and at medial joint line.  Pain worse with flexion beyond 150 degrees.  No gross swelling or deformity. d.  Good joint stability to varus and valgus stress..  Negative McMurrays and Thessaly.t.  5/5 quadriceps strength. Good quadricep bulk and tone, B symmetrical calf and poopliteal space benign.  NEURO--distally neurovascularly intact to soft touch sensation PULSES 2+ B= DP. SKIN : no rash, no redness or warmth  GAIT: normal stride length--no gross abnormality, no limp.   Impression & Recommendations:  Problem # 1:  KNEE PAIN, LEFT (ICD-719.46) Assessment New Either degenerative meniscal injury or osteoarthritis. Xrays negative for much DJD--we discussed and will proceed with MRI knee. Activity as needed. Tylenol for pain cell phone [phone]. f/u after MRI  Orders: Radiology  other (Radiology Other) MRI without Contrast (MRI w/o Contrast)  Complete Medication List: 1)  Alprazolam 0.5 Mg Tabs (Alprazolam) .... Take one tablet qid as needed 2)  Simvastatin 40 Mg Tabs (Simvastatin) .... Take one tablet at bedtime 3)  Citalopram Hydrobromide 40 Mg Tabs (Citalopram hydrobromide) .... Take one tablet daily 4)  Biotin 1000 Mcg Tabs (Biotin) .... Take one tablet daily 5)  Aspirin 81 Mg Tbec (Aspirin) .... Take one tablet daily 6)  Multivitamins Tabs (Multiple vitamin) .Marland Kitchen.. 1 by  mouth once daily 7)  Calcium 600 1500 Mg Tabs (Calcium carbonate) .Marland Kitchen.. 1 by mouth two times a day 8)  Nasonex 50 Mcg/act Susp (Mometasone furoate) .... 2 sprays each nostril once daily

## 2010-09-20 NOTE — Progress Notes (Signed)
Summary: [facility]--RX  Phone Note Refill Request   Refills Requested: Medication #1:  SIMVASTATIN 40 MG TABS take one tablet at bedtime [facility]--[phone]  Initial call taken by: Freddy Jaksch,  January 29, 2009 9:39 AM      Prescriptions: SIMVASTATIN 40 MG TABS (SIMVASTATIN) take one tablet at bedtime  #30 x 3   Entered by:   Kandice Hams   Authorized by:   Neena Rhymes MD   Signed by:   Kandice Hams on 01/29/2009   Method used:   Faxed to ...       [facility] [phone]* (retail)       [address]       [place], [place]  [address]       Ph: [phone] or [phone]       Fax: [phone]   RxID:   [id number]

## 2010-09-20 NOTE — Consult Note (Signed)
Summary: [facility]   Imported By: Marily Memos 06/13/2010 15:20:18  _____________________________________________________________________  External Attachment:    Type:   Image     Comment:   External Document

## 2010-09-20 NOTE — Assessment & Plan Note (Signed)
Summary: CONGESTION/COLD/PN   Vital Signs:  Patient profile:   60 year old female Weight:      138 pounds Temp:     98.3 degrees F oral Pulse rate:   72 / minute BP sitting:   116 / 78  (right arm) Cuff size:   regular  Vitals Entered By: Shonna Chock (April 24, 2009 10:46 AM) CC: Cold since Tuesday: Congestion, Fatigue, and achy Comments REVIEWED MED LIST, PATIENT AGREED DOSE AND INSTRUCTION CORRECT     CC:  Cold since Tuesday: Congestion, Fatigue, and and achy.  History of Present Illness: 60 y/o single fem w 4 days h/o st,nonprod cough.  Ros neg  Allergies (verified): No Known Drug Allergies  Past History:  Past medical, surgical, family and social histories (including risk factors) reviewed for relevance to current acute and chronic problems.  Past Medical History: Reviewed history from 09/30/2008 and no changes required. Depression Hyperlipidemia  Past Surgical History: Reviewed history from 09/30/2008 and no changes required. laser vein surgery tonsillectomy 2 D&Cs  Family History: Reviewed history from 09/30/2008 and no changes required. CAD-father HTN-mother DM-mother STROKE-mother,father COLON CA-no BREAST CA-no  Social History: Reviewed history from 09/30/2008 and no changes required. divorced mother of 2- daughter and son locally no tobacco, 1-2 glasses of wine daily, no drugs  Review of Systems      See HPI  Physical Exam  General:  Well-developed,well-nourished,in no acute distress; alert,appropriate and cooperative throughout examination Head:  Normocephalic and atraumatic without obvious abnormalities. No apparent alopecia or balding. Eyes:  No corneal or conjunctival inflammation noted. EOMI. Perrla. Funduscopic exam benign, without hemorrhages, exudates or papilledema. Vision grossly normal. Ears:  External ear exam shows no significant lesions or deformities.  Otoscopic examination reveals clear canals, tympanic membranes are  intact bilaterally without bulging, retraction, inflammation or discharge. Hearing is grossly normal bilaterally. Nose:  External nasal examination shows no deformity or inflammation. Nasal mucosa are pink and moist without lesions or exudates. Mouth:  Oral mucosa and oropharynx without lesions or exudates.  Teeth in good repair. Neck:  No deformities, masses, or tenderness noted. Lungs:  Normal respiratory effort, chest expands symmetrically. Lungs are clear to auscultation, no crackles or wheezes.   Problems:  Medical Problems Added: 1)  Dx of Viral Infection-unspec  (ICD-079.99)  Complete Medication List: 1)  Alprazolam 0.5 Mg Tabs (Alprazolam) .... Take one tablet qid as needed 2)  Simvastatin 40 Mg Tabs (Simvastatin) .... Take one tablet at bedtime 3)  Citalopram Hydrobromide 40 Mg Tabs (Citalopram hydrobromide) .... Take one tablet daily 4)  Biotin 1000 Mcg Tabs (Biotin) .... Take one tablet daily 5)  Aspirin 81 Mg Tbec (Aspirin) .... Take one tablet daily 6)  Multivitamins Tabs (Multiple vitamin) .Marland Kitchen.. 1 by mouth once daily 7)  Calcium 600 1500 Mg Tabs (Calcium carbonate) .Marland Kitchen.. 1 by mouth two times a day 8)  Hydromet 5-1.5 Mg/23ml Syrp (Hydrocodone-homatropine) .Marland Kitchen.. 1 or 2 tsps three times a day prn  Patient Instructions: 1)  Get plenty of rest, drink lots of clear liquids, and use Tylenol or Ibuprofen for fever and comfort. Return in 7-10 days if you're not better:sooner if you're feeling worse. 2)  Take 650-1000mg  of Tylenol every 4-6 hours as needed for relief of pain or comfort of fever AVOID taking more than 4000mg   in a 24 hour period (can cause liver damage in higher doses). 3)  Take 400-600mg  of Ibuprofen (Advil, Motrin) with food every 4-6 hours as needed for relief of pain  or comfort of feve 4)  1 or 2 tsps tid prn hydromet r. Prescriptions: HYDROMET 5-1.5 MG/5ML SYRP (HYDROCODONE-HOMATROPINE) 1 or 2 tsps three times a day prn  #8oz x 0   Entered and Authorized by:    Roderick Pee MD   Signed by:   Roderick Pee MD on 04/24/2009   Method used:   Print then Give to Patient   RxID:   [id number]

## 2010-09-20 NOTE — Miscellaneous (Signed)
  Clinical Lists Changes  Appt is 05/19/10 @ 9:45am w/ Dr Luiz Blare. [address]  Pt notified of appt info.

## 2010-09-20 NOTE — Miscellaneous (Signed)
Summary: labs from [facility]  Clinical Lists Changes  Observations: Added new observation of TSH: 3.817 microintl units/mL (02/08/2010 14:30) Added new observation of BILI TOTAL: 0.6 mg/dL (16/05/9603 54:09) Added new observation of ALK PHOS: 53 units/L (02/08/2010 14:30) Added new observation of SGPT (ALT): 30 units/L (02/08/2010 14:30) Added new observation of SGOT (AST): 23 units/L (02/08/2010 14:30) Added new observation of PROTEIN, TOT: 6.2 g/dL (81/19/1478 29:56) Added new observation of ALBUMIN: 4.4 g/dL (21/30/8657 84:69) Added new observation of CALCIUM: 9.6 mg/dL (62/95/2841 32:44) Added new observation of GLUCOSE SER: 56 mg/dL (08/23/7251 66:44) Added new observation of CREATININE: 0.75 mg/dL (03/47/4259 56:38) Added new observation of BUN: 20 mg/dL (75/64/3329 51:88) Added new observation of CO2 TOTAL: 27 mmol/L (02/08/2010 14:30) Added new observation of CHLORIDE: 104 mmol/L (02/08/2010 14:30) Added new observation of POTASSIUM: 4.6 mmol/L (02/08/2010 14:30) Added new observation of SODIUM: 141 mmol/L (02/08/2010 14:30) Added new observation of PLATELETS: 203 10*3/mm3 (02/08/2010 14:30) Added new observation of MCH: 30.6 pg (02/08/2010 14:30) Added new observation of MCV: 93.7 fL (02/08/2010 14:30) Added new observation of HCT: 44.4 % (02/08/2010 14:30) Added new observation of HGB: 14.5 g/dL (41/66/0630 16:01) Added new observation of RBC: 4.74 10*6/mm3 (02/08/2010 14:30) Added new observation of WBC: 6.1 10*3/mm3 (02/08/2010 14:30)

## 2010-09-20 NOTE — Letter (Signed)
Summary: New Patient Letter  [facility]  [address] [place], [place] [address]   Phone: [phone]  Fax: [phone]       08/26/2008 MRN: [id number]  Amy Perkins [address] [place], [place]  [address]  Dear Ms. Griffiths,   Welcome to [facility] and thank you for choosing [place] as your Primary Care Providers. Enclosed you will find information about our practice that we hope you find helpful. We have also enclosed forms to be filled out prior to your visit. This will provide [place] with the necessary information and facilitate your being seen in a timely manner. If you have any questions, please call us at:  [phone]    and we will be happy to assist you. We look forward to seeing you at your scheduled appointment time.  Appointment   09-30-08 @ 10:30 am      with Dr.    Neena Rhymes       Sincerely,  Primary Health Care Team  Please arrive 15 minutes early for your first appointment and bring your insurance card. Co-pay is required at the time of your visit.  *****Please call the office if you are not able to keep this appointment. There is a charge of $50.00 if any appointment is not cancelled or rescheduled within 24 hours.

## 2010-10-27 ENCOUNTER — Ambulatory Visit (INDEPENDENT_AMBULATORY_CARE_PROVIDER_SITE_OTHER): Payer: BC Managed Care – PPO | Admitting: Family Medicine

## 2010-10-27 ENCOUNTER — Encounter: Payer: Self-pay | Admitting: Family Medicine

## 2010-10-27 DIAGNOSIS — B029 Zoster without complications: Secondary | ICD-10-CM | POA: Insufficient documentation

## 2010-10-31 ENCOUNTER — Telehealth: Payer: Self-pay | Admitting: Family Medicine

## 2010-11-01 NOTE — Assessment & Plan Note (Signed)
Summary: SHINGLES? AREA ON BACK///SPH   Vital Signs:  Patient profile:   60 year old female Height:      63.50 inches (161.29 cm) Weight:      145.50 pounds (66.14 kg) BMI:     25.46 Temp:     97.8 degrees F (36.56 degrees C) oral BP sitting:   100 / 68  (left arm) Cuff size:   regular  Vitals Entered By: Lucious Groves CMA (October 27, 2010 1:58 PM) CC: Possible shingles x1 week./kb Is Patient Diabetic? No Pain Assessment Patient in pain? no      Comments Patient notes that the most prominent area is on her back, but now she also has an area on her neck.    History of Present Illness: 60 yo woman here today for ? shingles.  sxs started 1 week ago.  some itching.  also has soreness.  located on back.  new spot yesterday on front of neck.  initially thought area was an insect bite.  hx of chicken pox, has never had shingles.  Current Medications (verified): 1)  Alprazolam 0.5 Mg Tabs (Alprazolam) .... Take One Tablet Qid As Needed 2)  Simvastatin 40 Mg Tabs (Simvastatin) .... Take One Tablet At Bedtime 3)  Citalopram Hydrobromide 40 Mg Tabs (Citalopram Hydrobromide) .... Take One Tablet Daily 4)  Biotin 1000 Mcg Tabs (Biotin) .... Take One Tablet Daily 5)  Aspirin 81 Mg Tbec (Aspirin) .... Take One Tablet Daily 6)  Multivitamins  Tabs (Multiple Vitamin) .Marland Kitchen.. 1 By Mouth Once Daily 7)  Calcium 600 1500 Mg Tabs (Calcium Carbonate) .Marland Kitchen.. 1 By Mouth Two Times A Day 8)  Nasonex 50 Mcg/act Susp (Mometasone Furoate) .... 2 Sprays Each Nostril Once Daily  Allergies (verified): No Known Drug Allergies  Review of Systems      See HPI  Physical Exam  General:  Well appearing, no acute distress Skin:  2 patches of vesicles on R upper back consistent w/ shingles   Impression & Recommendations:  Problem # 1:  SHINGLES (ICD-053.9) Assessment New pt's 'rash' consistent w/ shingles.  start Valtrex.  reviewed supportive care and red flags that should prompt return.  Pt expresses  understanding and is in agreement w/ this plan.  Complete Medication List: 1)  Alprazolam 0.5 Mg Tabs (Alprazolam) .... Take one tablet qid as needed 2)  Simvastatin 40 Mg Tabs (Simvastatin) .... Take one tablet at bedtime 3)  Citalopram Hydrobromide 40 Mg Tabs (Citalopram hydrobromide) .... Take one tablet daily 4)  Biotin 1000 Mcg Tabs (Biotin) .... Take one tablet daily 5)  Aspirin 81 Mg Tbec (Aspirin) .... Take one tablet daily 6)  Multivitamins Tabs (Multiple vitamin) .Marland Kitchen.. 1 by mouth once daily 7)  Calcium 600 1500 Mg Tabs (Calcium carbonate) .Marland Kitchen.. 1 by mouth two times a day 8)  Nasonex 50 Mcg/act Susp (Mometasone furoate) .... 2 sprays each nostril once daily 9)  Valtrex 1 Gm Tabs (Valacyclovir hcl) .Marland Kitchen.. 1 tab three times a day x7 days  Patient Instructions: 1)  This is shingles 2)  Keep the area covered 3)  Avoid pregnant women who have not had chicken pox 4)  Take the Valtrex as directed to shorten the duration and lessen the severity 5)  Call with any questions or concerns 6)  Hang in there!!! Prescriptions: VALTREX 1 GM TABS (VALACYCLOVIR HCL) 1 tab three times a day x7 days  #21 x 0   Entered and Authorized by:   Neena Rhymes MD   Signed  by:   Neena Rhymes MD on 10/27/2010   Method used:   Electronically to        [facility] [phone]* (retail)       [address]       [place], [place]  [address]       Ph: [phone]       Fax: [phone]   RxID:   [id number]    Orders Added: 1)  Est. Patient Level III [244[address]]

## 2010-11-08 NOTE — Progress Notes (Signed)
Summary: REFILL--Simvastatin  Phone Note Refill Request Call back at [phone] Message from:  Pharmacy on October 31, 2010 2:31 PM  Refills Requested: Medication #1:  SIMVASTATIN 40 MG TABS take one tablet at bedtime   Dosage confirmed as above?Dosage Confirmed   Supply Requested: 1 month [facility] [address]  Next Appointment Scheduled: NONE Initial call taken by: Lavell Islam,  October 31, 2010 2:31 PM    Prescriptions: SIMVASTATIN 40 MG TABS (SIMVASTATIN) take one tablet at bedtime  #30 x 2   Entered by:   Lucious Groves CMA   Authorized by:   Neena Rhymes MD   Signed by:   Lucious Groves CMA on 10/31/2010   Method used:   [facility]        [facility] [phone][facility] (retail)       [address] [place], [place]  [address]       Ph: [phone]       Fax: [phone]   RxID:   [id number]

## 2011-01-06 NOTE — Op Note (Signed)
[facility]  Patient:    Amy Perkins, Amy Perkins Visit Number: [id number] MRN: [id number]          Service Type: DSU Location: [facility] Attending Physician:  Marcelle Overlie Dictated by:   Marcelle Overlie, M.D. Proc. Date: 09/19/01 Admit Date:  09/19/2001                             Operative Report  PREOPERATIVE DIAGNOSIS:       Abnormal uterine bleeding and thickened endometrial stripe.  POSTOPERATIVE DIAGNOSES:      Abnormal uterine bleeding and thickened endometrial stripe.  PROCEDURE:                    Dilatation and curettage and diagnostic hysteroscopy.  SURGEON:                      Marcelle Overlie, M.D.  ANESTHESIA:                   MAC with paracervical.  FINDINGS:                     Fluffy endometrium.  No polyp or fibroid seen.  PROCEDURE:                    Patient was taken to the operating room.  She was given sedation, placed in the lithotomy position.  The vagina and vulva were prepped and draped in the usual sterile fashion.  In-and-out catheter was used to empty the bladder.  A speculum was inserted into the vagina.  Cervix was grasped with a tenaculum and a paracervical block was performed in the usual fashion.  The cervical internal os was gently dilated using Pratt dilators.  Diagnostic hysteroscope was inserted into the uterus with good distention and good visibility.  The entire cavity was inspected.  There was no fibroid or polyp seen.  There was some fluffy endometrium coming from the posterior wall which I believe is what was consistent with findings from ultrasound preoperatively.  The hysteroscope was removed and a sharp curette was inserted into the uterus and the uterus was serially curetted.  All tissue was sent to pathology for analysis.  All instruments were removed from the vagina.  All sponge, lap, and instrument counts were correct x2.  Patient tolerated procedure well and went to recovery room in stable  condition. Dictated by:   Marcelle Overlie, M.D. Attending Physician:  Marcelle Overlie DD:  09/19/01 TD:  09/19/01 Job: [id number]

## 2011-01-06 NOTE — Op Note (Signed)
   NAME:  Amy Perkins, Amy Perkins                         ACCOUNT NO.:  [id number]   MEDICAL RECORD NO.:  [id number]                   PATIENT TYPE:  AMB   LOCATION:  [facility]                                  FACILITY:  [facility]   PHYSICIAN:  Michelle L. Vincente Poli, M.D.            DATE OF BIRTH:  02-20-1951   DATE OF PROCEDURE:  09/19/2002  DATE OF DISCHARGE:                                 OPERATIVE REPORT   PREOPERATIVE DIAGNOSES:  1. Menorrhagia.  2. Endometrial polyp.   POSTOPERATIVE DIAGNOSES:  1. Menorrhagia.  2. Endometrial polyp.   PROCEDURE:  Dilatation and curettage.   SURGEON:  Michelle L. Vincente Poli, M.D.   ANESTHESIA:  MAC with paracervical.   FINDINGS:  Questionable small endometrial polyp.   COMPLICATIONS:  None.   PROCEDURE:  The patient was taken to the operating room.  She was given  sedation and placed in the lithotomy position.  The vagina and vulva were  prepped and draped in the usual sterile fashion.  An in-and-out catheter was  used to empty the bladder.  A speculum was inserted into the vagina.  The  cervix was grasped with a tenaculum and a paracervical block was performed  in the standard fashion.  The uterus was sounded, was retroverted, and was 9  cm.  Cervical internal os was gently dilated and the sharp curette was  inserted into the uterus and the uterus was thoroughly curetted of all  tissue.  The tissue did grossly look consistent with an endometrial polyp.  All tissue was sent to pathology.  There was no bleeding at the end of the  procedure.  All sponge, lap, and instrument counts were correct x2.  The  patient tolerated procedure well and went to recovery room in stable  condition.  She will follow up with me in one week.  At that time we will  discuss her pathology report.                                               Michelle L. Vincente Poli, M.D.    Florestine Avers  D:  09/19/2002  T:  09/19/2002  Job:  [id number]

## 2011-01-23 ENCOUNTER — Other Ambulatory Visit: Payer: Self-pay | Admitting: Family Medicine

## 2011-01-23 MED ORDER — MOMETASONE FUROATE 50 MCG/ACT NA SUSP: 2.0000 | Freq: Every day | NASAL | Status: DC

## 2011-01-23 NOTE — Telephone Encounter (Signed)
Pt is due for CPX this month.

## 2011-01-30 ENCOUNTER — Other Ambulatory Visit: Payer: Self-pay | Admitting: Family Medicine

## 2011-01-30 MED ORDER — SIMVASTATIN 40 MG PO TABS: 40.0000 mg | ORAL_TABLET | Freq: Every day | ORAL | Status: DC

## 2011-01-30 NOTE — Telephone Encounter (Signed)
Refill sent pt is due for CPX, will send note.

## 2011-02-10 ENCOUNTER — Encounter: Payer: Self-pay | Admitting: Family Medicine

## 2011-02-23 ENCOUNTER — Ambulatory Visit (INDEPENDENT_AMBULATORY_CARE_PROVIDER_SITE_OTHER): Payer: BC Managed Care – PPO | Admitting: Family Medicine

## 2011-02-23 ENCOUNTER — Encounter: Payer: Self-pay | Admitting: Family Medicine

## 2011-02-23 DIAGNOSIS — H6120 Impacted cerumen, unspecified ear: Secondary | ICD-10-CM

## 2011-02-23 DIAGNOSIS — E785 Hyperlipidemia, unspecified: Secondary | ICD-10-CM

## 2011-02-23 DIAGNOSIS — Z0000 Encounter for general adult medical examination without abnormal findings: Secondary | ICD-10-CM

## 2011-02-23 LAB — TSH: TSH: 3.37 u[IU]/mL (ref 0.35–5.50)

## 2011-02-23 LAB — BASIC METABOLIC PANEL
BUN: 20 mg/dL (ref 6–23)
CO2: 27 mEq/L (ref 19–32)
Calcium: 9.2 mg/dL (ref 8.4–10.5)
Chloride: 102 mEq/L (ref 96–112)
Creatinine, Ser: 0.7 mg/dL (ref 0.4–1.2)
GFR: 86.13 mL/min (ref >60.00)
Glucose, Bld: 109 mg/dL — ABNORMAL HIGH (ref 70–99)
Potassium: 3.9 mEq/L (ref 3.5–5.1)
Sodium: 136 mEq/L (ref 135–145)

## 2011-02-23 LAB — CBC WITH DIFFERENTIAL/PLATELET
Basophils Absolute: 0 10*3/uL (ref 0.0–0.1)
Basophils Relative: 0.5 % (ref 0.0–3.0)
Eosinophils Absolute: 0.1 10*3/uL (ref 0.0–0.7)
Eosinophils Relative: 2.7 % (ref 0.0–5.0)
HCT: 40.5 % (ref 36.0–46.0)
Hemoglobin: 14 g/dL (ref 12.0–15.0)
Lymphocytes Relative: 34.9 % (ref 12.0–46.0)
Lymphs Abs: 1.7 10*3/uL (ref 0.7–4.0)
MCHC: 34.5 g/dL (ref 30.0–36.0)
MCV: 91.4 fl (ref 78.0–100.0)
Monocytes Absolute: 0.3 10*3/uL (ref 0.1–1.0)
Monocytes Relative: 6.5 % (ref 3.0–12.0)
Neutro Abs: 2.7 10*3/uL (ref 1.4–7.7)
Neutrophils Relative %: 55.4 % (ref 43.0–77.0)
Platelets: 192 10*3/uL (ref 150.0–400.0)
RBC: 4.43 Mil/uL (ref 3.87–5.11)
RDW: 12.8 % (ref 11.5–14.6)
WBC: 4.9 10*3/uL (ref 4.5–10.5)

## 2011-02-23 LAB — HEPATIC FUNCTION PANEL
ALT: 39 U/L — ABNORMAL HIGH (ref 0–35)
AST: 28 U/L (ref 0–37)
Albumin: 4.3 g/dL (ref 3.5–5.2)
Alkaline Phosphatase: 49 U/L (ref 39–117)
Bilirubin, Direct: 0 mg/dL (ref 0.0–0.3)
Total Bilirubin: 0.3 mg/dL (ref 0.3–1.2)
Total Protein: 6.3 g/dL (ref 6.0–8.3)

## 2011-02-23 LAB — LIPID PANEL
Cholesterol: 162 mg/dL (ref 0–200)
HDL: 56.7 mg/dL (ref >39.00)
LDL Cholesterol: 91 mg/dL (ref 0–99)
Total CHOL/HDL Ratio: 3
Triglycerides: 74 mg/dL (ref 0.0–149.0)
VLDL: 14.8 mg/dL (ref 0.0–40.0)

## 2011-02-23 MED ORDER — MOMETASONE FUROATE 50 MCG/ACT NA SUSP: 2.0000 | Freq: Every day | NASAL | Status: DC

## 2011-02-23 MED ORDER — SIMVASTATIN 40 MG PO TABS: 40.0000 mg | ORAL_TABLET | Freq: Every day | ORAL | Status: DC

## 2011-02-23 NOTE — Assessment & Plan Note (Signed)
Check labs.  Adjust meds prn  

## 2011-02-23 NOTE — Progress Notes (Signed)
  Subjective:    Patient ID: Amy Perkins, female    DOB: Jul 08, 1951, 60 y.o.   MRN: [id number]  HPI CPE- UTD on pap and mammo ([facility]al).  UTD on colonoscopy (2011).  No concerns today   Review of Systems Patient reports no vision/ hearing changes, adenopathy,fever, weight change,  persistant/recurrent hoarseness , swallowing issues, chest pain, palpitations, edema, persistant/recurrent cough, hemoptysis, dyspnea (rest/exertional/paroxysmal nocturnal), gastrointestinal bleeding (melena, rectal bleeding), abdominal pain, significant heartburn, bowel changes, GU symptoms (dysuria, hematuria, incontinence), Gyn symptoms (abnormal  bleeding, pain),  syncope, focal weakness, memory loss, numbness & tingling, skin/hair/nail changes, abnormal bruising or bleeding, anxiety, or depression.     Objective:   Physical Exam  General Appearance:    Alert, cooperative, no distress, appears stated age  Head:    Normocephalic, without obvious abnormality, atraumatic  Eyes:    PERRL, conjunctiva/corneas clear, EOM's intact, fundi    benign, both eyes  Ears:    TMs obscured by wax bilaterally.  After irrigation, TMs normal  Nose:   Nares normal, septum midline, mucosa normal, no drainage    or sinus tenderness  Throat:   Lips, mucosa, and tongue normal; teeth and gums normal  Neck:   Supple, symmetrical, trachea midline, no adenopathy;    Thyroid: no enlargement/tenderness/nodules  Back:     Symmetric, no curvature, ROM normal, no CVA tenderness  Lungs:     Clear to auscultation bilaterally, respirations unlabored  Chest Wall:    No tenderness or deformity   Heart:    Regular rate and rhythm, S1 and S2 normal, no murmur, rub   or gallop  Breast Exam:    Deferred to GYN  Abdomen:     Soft, non-tender, bowel sounds active all four quadrants,    no masses, no organomegaly  Genitalia:    Deferred to GYN  Rectal:    GYN  Extremities:   Extremities normal, atraumatic, no cyanosis or edema  Pulses:   2+  and symmetric all extremities  Skin:   Skin color, texture, turgor normal, no rashes or lesions  Lymph nodes:   Cervical, supraclavicular, and axillary nodes normal  Neurologic:   CNII-XII intact, normal strength, sensation and reflexes    throughout          Assessment & Plan:

## 2011-02-23 NOTE — Patient Instructions (Signed)
Follow up in 6 months to recheck cholesterol Keep up the good work on diet and exercise- you look great! We'll notify you of your lab results Call with any questions or concerns Have a great summer!

## 2011-02-23 NOTE — Assessment & Plan Note (Signed)
Pt's PE WNL.  UTD on health maintenance.  Check labs.  Anticipatory guidance provided.  

## 2011-02-23 NOTE — Assessment & Plan Note (Signed)
Wax successfully removed via irrigation bilaterally

## 2011-02-25 LAB — VITAMIN D 1,25 DIHYDROXY
Vitamin D 1, 25 (OH)2 Total: 24 pg/mL (ref 18–72)
Vitamin D2 1, 25 (OH)2: <8 pg/mL
Vitamin D3 1, 25 (OH)2: 24 pg/mL

## 2011-02-27 ENCOUNTER — Encounter: Payer: Self-pay | Admitting: *Deleted

## 2011-02-28 ENCOUNTER — Other Ambulatory Visit: Payer: Self-pay | Admitting: Family Medicine

## 2011-03-01 NOTE — Telephone Encounter (Signed)
Refill sent.

## 2011-06-28 ENCOUNTER — Encounter: Payer: Self-pay | Admitting: Family Medicine

## 2011-06-28 ENCOUNTER — Ambulatory Visit (HOSPITAL_BASED_OUTPATIENT_CLINIC_OR_DEPARTMENT_OTHER)
Admission: RE | Admit: 2011-06-28 | Discharge: 2011-06-28 | Disposition: A | Discharge status: Home / Self Care | Payer: BC Managed Care – PPO | Source: Ambulatory Visit | Attending: Family Medicine | Admitting: Family Medicine

## 2011-06-28 ENCOUNTER — Ambulatory Visit (INDEPENDENT_AMBULATORY_CARE_PROVIDER_SITE_OTHER): Payer: BC Managed Care – PPO | Admitting: Family Medicine

## 2011-06-28 VITALS — BP 110/65 | HR 60 | Temp 97.9°F | Ht 64.0 in | Wt 146.6 lb

## 2011-06-28 DIAGNOSIS — M79609 Pain in unspecified limb: Secondary | ICD-10-CM

## 2011-06-28 DIAGNOSIS — M79669 Pain in unspecified lower leg: Secondary | ICD-10-CM

## 2011-06-28 DIAGNOSIS — Z23 Encounter for immunization: Secondary | ICD-10-CM

## 2011-06-28 NOTE — Progress Notes (Signed)
  Subjective:    Patient ID: Amy Perkins, female    DOB: 11/27/1950, 60 y.o.   MRN: [id number]  HPI R leg pain- developed pain 4-5 days ago after working 'a lot of hrs'.  Pain starts 1/2 up shin and radiates down.  Previously had vein surgery on this leg.  No swelling, no redness.  Area is tender to touch.  Some relief w/ ibuprofen.  Daughter has Factor V Leiden and told pt she should be worried about DVT  Has grandbaby on the way and daughter wants her to have Tdap   Review of Systems For ROS see HPI     Objective:   Physical Exam  Constitutional: She appears well-developed and well-nourished. No distress.  Musculoskeletal: Normal range of motion. She exhibits tenderness (mild TTP over R posterior lower leg, no erythema, warmth, or edema). She exhibits no edema.          Assessment & Plan:

## 2011-06-28 NOTE — Patient Instructions (Signed)
We'll notify you of your ultrasound results Continue the Ibuprofen for pain relief Alternate heat and ice Call with any questions or concerns Hang in there!!!

## 2011-07-09 NOTE — Assessment & Plan Note (Signed)
Pt's pain is most likely musculoskeletal but given family hx of Factor V Leiden will get [place] to r/o DVT.  Reviewed supportive care and red flags that should prompt return.  Pt expressed understanding and is in agreement w/ plan.

## 2011-11-24 ENCOUNTER — Other Ambulatory Visit: Payer: Self-pay | Admitting: Family Medicine

## 2011-11-24 MED ORDER — SIMVASTATIN 40 MG PO TABS: 40.0000 mg | ORAL_TABLET | Freq: Every day | ORAL | Status: DC

## 2011-11-24 NOTE — Telephone Encounter (Signed)
Refill for  simvastatin 40MG  Tablet Qty 90  Last written 7.10.12 Last OV 11.7.12 Last instruction Take 1-tablet by mouth at bedtime

## 2011-11-24 NOTE — Telephone Encounter (Signed)
rx sent to pharmacy by e-script for #30 with 1 refill per pt noted to have been in for labs since 02-2011 Letter has been mailed to pt address noted in the chart to advise they are overdue for cpe/ov/labs and the pt needs to contact office to set up appt

## 2011-11-27 ENCOUNTER — Ambulatory Visit (INDEPENDENT_AMBULATORY_CARE_PROVIDER_SITE_OTHER): Payer: BC Managed Care – PPO | Admitting: Family Medicine

## 2011-11-27 ENCOUNTER — Encounter: Payer: Self-pay | Admitting: Family Medicine

## 2011-11-27 VITALS — BP 115/75 | HR 68 | Temp 98.8°F | Ht 63.5 in | Wt 144.4 lb

## 2011-11-27 DIAGNOSIS — R197 Diarrhea, unspecified: Secondary | ICD-10-CM

## 2011-11-27 MED ORDER — SIMVASTATIN 40 MG PO TABS: 40.0000 mg | ORAL_TABLET | Freq: Every day | ORAL | Status: DC

## 2011-11-27 NOTE — Assessment & Plan Note (Signed)
Pt's sxs consistent w/ viral illness.  immodium prn.  Encouraged increased fluids.  Reviewed supportive care and red flags that should prompt return.  Pt expressed understanding and is in agreement w/ plan.

## 2011-11-27 NOTE — Patient Instructions (Signed)
This is a viral illness and will continue to improve w/ time Drink plenty of fluids REST! Immodium as needed Call with any questions or concerns Hang in there!!!!

## 2011-11-27 NOTE — Progress Notes (Signed)
  Subjective:    Patient ID: Amy Perkins, female    DOB: [birth year], 61 y.o.   MRN: [id number]  HPI GI sxs- developed diarrhea on Saturday.  No vomiting.  Feeling weak.  Stools are watery.  Had 3 episodes this AM, took OTC diarrhea pill w/ some relief.  Low grade temp, Tm 99.3.  Having abdominal cramping but no focal pain.  No urinary sxs.   Review of Systems For ROS see HPI     Objective:   Physical Exam  Constitutional: She is oriented to person, place, and time. She appears well-developed and well-nourished. No distress.  HENT:  Head: Normocephalic and atraumatic.       MMM  Neck: Neck supple.  Cardiovascular: Normal rate, regular rhythm and intact distal pulses.   Pulmonary/Chest: Effort normal and breath sounds normal. No respiratory distress. She has no wheezes. She has no rales.  Abdominal: Soft. She exhibits no distension. There is no tenderness. There is no rebound.       Hyperactive BS  Lymphadenopathy:    She has no cervical adenopathy.  Neurological: She is alert and oriented to person, place, and time.  Skin: Skin is warm and dry.          Assessment & Plan:

## 2012-01-22 ENCOUNTER — Other Ambulatory Visit: Payer: Self-pay | Admitting: Family Medicine

## 2012-01-22 NOTE — Telephone Encounter (Signed)
Refill simvastatin 40mg  tablet  Qty 30 Take one tablet by mouth at bedtime  Last fill 5.5.13 Last OV 4.8.13

## 2012-01-23 MED ORDER — SIMVASTATIN 40 MG PO TABS: 40.0000 mg | ORAL_TABLET | Freq: Every day | ORAL | Status: DC

## 2012-01-23 NOTE — Telephone Encounter (Signed)
rx sent to pharmacy by e-script for #30 per pt overdue for labs Letter has been mailed to pt address noted in the chart to advise they are overdue for cpe/ov/labs and the pt needs to contact office to set up appt

## 2012-02-27 ENCOUNTER — Other Ambulatory Visit: Payer: Self-pay | Admitting: Family Medicine

## 2012-02-27 MED ORDER — SIMVASTATIN 40 MG PO TABS: 40.0000 mg | ORAL_TABLET | Freq: Every day | ORAL | Status: DC

## 2012-02-27 NOTE — Telephone Encounter (Signed)
refill Simvastatin (Tab) ZOCOR 40 MG Take 1 tablet (40 mg total) by mouth at bedtime #30, last fill 6.4.13, last ov 4.8.13

## 2012-02-27 NOTE — Telephone Encounter (Signed)
Note pt upcoming apt on 04-10-12, sent enough to last til pt apt via escribe

## 2012-03-15 ENCOUNTER — Telehealth: Payer: Self-pay | Admitting: Family Medicine

## 2012-03-15 MED ORDER — CITALOPRAM HYDROBROMIDE 40 MG PO TABS: 40.0000 mg | ORAL_TABLET | Freq: Every day | ORAL | Status: DC

## 2012-03-15 NOTE — Telephone Encounter (Signed)
Refill: Citalopram hbr 40mg  tablet. Take 1 tablet by mouth at bedtime. Qty 90. Last fill 02-26-12

## 2012-03-15 NOTE — Telephone Encounter (Signed)
rx sent to pharmacy by e-script  

## 2012-03-27 ENCOUNTER — Other Ambulatory Visit: Payer: Self-pay | Admitting: Obstetrics and Gynecology

## 2012-04-10 ENCOUNTER — Encounter: Payer: Self-pay | Admitting: Family Medicine

## 2012-04-10 ENCOUNTER — Ambulatory Visit (INDEPENDENT_AMBULATORY_CARE_PROVIDER_SITE_OTHER): Payer: BC Managed Care – PPO | Admitting: Family Medicine

## 2012-04-10 VITALS — BP 112/70 | HR 67 | Temp 98.3°F | Ht 63.75 in | Wt 146.0 lb

## 2012-04-10 DIAGNOSIS — Z0000 Encounter for general adult medical examination without abnormal findings: Secondary | ICD-10-CM

## 2012-04-10 DIAGNOSIS — E785 Hyperlipidemia, unspecified: Secondary | ICD-10-CM

## 2012-04-10 LAB — BASIC METABOLIC PANEL
BUN: 17 mg/dL (ref 6–23)
CO2: 30 mEq/L (ref 19–32)
Calcium: 9.6 mg/dL (ref 8.4–10.5)
Chloride: 106 mEq/L (ref 96–112)
Creatinine, Ser: 0.7 mg/dL (ref 0.4–1.2)
GFR: 98.11 mL/min (ref >60.00)
Glucose, Bld: 113 mg/dL — ABNORMAL HIGH (ref 70–99)
Potassium: 4.2 mEq/L (ref 3.5–5.1)
Sodium: 141 mEq/L (ref 135–145)

## 2012-04-10 LAB — CBC WITH DIFFERENTIAL/PLATELET
Basophils Absolute: 0 10*3/uL (ref 0.0–0.1)
Basophils Relative: 0.5 % (ref 0.0–3.0)
Eosinophils Absolute: 0.1 10*3/uL (ref 0.0–0.7)
Eosinophils Relative: 2.2 % (ref 0.0–5.0)
HCT: 43.4 % (ref 36.0–46.0)
Hemoglobin: 14.5 g/dL (ref 12.0–15.0)
Lymphocytes Relative: 37.9 % (ref 12.0–46.0)
Lymphs Abs: 1.9 10*3/uL (ref 0.7–4.0)
MCHC: 33.3 g/dL (ref 30.0–36.0)
MCV: 90.9 fl (ref 78.0–100.0)
Monocytes Absolute: 0.4 10*3/uL (ref 0.1–1.0)
Monocytes Relative: 7.4 % (ref 3.0–12.0)
Neutro Abs: 2.6 10*3/uL (ref 1.4–7.7)
Neutrophils Relative %: 52 % (ref 43.0–77.0)
Platelets: 207 10*3/uL (ref 150.0–400.0)
RBC: 4.78 Mil/uL (ref 3.87–5.11)
RDW: 12.6 % (ref 11.5–14.6)
WBC: 4.9 10*3/uL (ref 4.5–10.5)

## 2012-04-10 LAB — HEPATIC FUNCTION PANEL
ALT: 30 U/L (ref 0–35)
AST: 24 U/L (ref 0–37)
Albumin: 4.3 g/dL (ref 3.5–5.2)
Alkaline Phosphatase: 49 U/L (ref 39–117)
Bilirubin, Direct: 0.1 mg/dL (ref 0.0–0.3)
Total Bilirubin: 0.7 mg/dL (ref 0.3–1.2)
Total Protein: 6.6 g/dL (ref 6.0–8.3)

## 2012-04-10 LAB — LIPID PANEL
Cholesterol: 167 mg/dL (ref 0–200)
HDL: 65.5 mg/dL (ref >39.00)
LDL Cholesterol: 91 mg/dL (ref 0–99)
Total CHOL/HDL Ratio: 3
Triglycerides: 52 mg/dL (ref 0.0–149.0)
VLDL: 10.4 mg/dL (ref 0.0–40.0)

## 2012-04-10 LAB — TSH: TSH: 3.39 u[IU]/mL (ref 0.35–5.50)

## 2012-04-10 NOTE — Patient Instructions (Addendum)
Follow up in 6 months to recheck cholesterol You look great!  Keep up the good work! We'll notify you of your lab results Call your insurance company and ask about the shingles shot Call with any questions or concerns Enjoy what's left of summer!!!

## 2012-04-10 NOTE — Assessment & Plan Note (Signed)
Pt's PE WNL.  UTD on GYN, colonoscopy.  Check labs.  Anticipatory guidance provided.  

## 2012-04-10 NOTE — Progress Notes (Signed)
  Subjective:    Patient ID: Amy Perkins, female    DOB: March 27, 1951, 61 y.o.   MRN: [id number]  HPI CPE- UTD on GYN (Grewal).  Colonoscopy 2011.   Review of Systems Patient reports no vision/ hearing changes, adenopathy,fever, weight change,  persistant/recurrent hoarseness , swallowing issues, chest pain, palpitations, edema, persistant/recurrent cough, hemoptysis, dyspnea (rest/exertional/paroxysmal nocturnal), gastrointestinal bleeding (melena, rectal bleeding), abdominal pain, significant heartburn, bowel changes, GU symptoms (dysuria, hematuria, incontinence), Gyn symptoms (abnormal  bleeding, pain),  syncope, focal weakness, memory loss, numbness & tingling, skin/hair/nail changes, abnormal bruising or bleeding, anxiety, or depression.     Objective:   Physical Exam General Appearance:    Alert, cooperative, no distress, appears stated age  Head:    Normocephalic, without obvious abnormality, atraumatic  Eyes:    PERRL, conjunctiva/corneas clear, EOM's intact, fundi    benign, both eyes  Ears:    Normal TM's and external ear canals, both ears  Nose:   Nares normal, septum midline, mucosa normal, no drainage    or sinus tenderness  Throat:   Lips, mucosa, and tongue normal; teeth and gums normal  Neck:   Supple, symmetrical, trachea midline, no adenopathy;    Thyroid: no enlargement/tenderness/nodules  Back:     Symmetric, no curvature, ROM normal, no CVA tenderness  Lungs:     Clear to auscultation bilaterally, respirations unlabored  Chest Wall:    No tenderness or deformity   Heart:    Regular rate and rhythm, S1 and S2 normal, no murmur, rub   or gallop  Breast Exam:    Deferred to GYN  Abdomen:     Soft, non-tender, bowel sounds active all four quadrants,    no masses, no organomegaly  Genitalia:    Deferred to GYN  Rectal:    Extremities:   Extremities normal, atraumatic, no cyanosis or edema  Pulses:   2+ and symmetric all extremities  Skin:   Skin color, texture,  turgor normal, no rashes or lesions  Lymph nodes:   Cervical, supraclavicular, and axillary nodes normal  Neurologic:   CNII-XII intact, normal strength, sensation and reflexes    throughout          Assessment & Plan:

## 2012-04-10 NOTE — Assessment & Plan Note (Signed)
Chronic problem.  Check labs.  Adjust meds prn  

## 2012-04-11 LAB — HEMOGLOBIN A1C: Hgb A1c MFr Bld: 5.8 % (ref 4.6–6.5)

## 2012-04-12 ENCOUNTER — Encounter: Payer: Self-pay | Admitting: *Deleted

## 2012-04-13 LAB — VITAMIN D 1,25 DIHYDROXY
Vitamin D 1, 25 (OH)2 Total: 32 pg/mL (ref 18–72)
Vitamin D2 1, 25 (OH)2: <8 pg/mL
Vitamin D3 1, 25 (OH)2: 32 pg/mL

## 2012-04-15 ENCOUNTER — Encounter: Payer: Self-pay | Admitting: *Deleted

## 2012-04-30 ENCOUNTER — Telehealth: Payer: Self-pay | Admitting: Family Medicine

## 2012-04-30 NOTE — Telephone Encounter (Signed)
Refill: Simvastatin 40mg  tablet. Take 1 tablet by mouth at bedtime. Qty 60. Last fill 02-27-12

## 2012-05-02 MED ORDER — SIMVASTATIN 40 MG PO TABS: 40.0000 mg | ORAL_TABLET | Freq: Every day | ORAL | Status: DC

## 2012-05-02 NOTE — Telephone Encounter (Signed)
rx sent to pharmacy by e-script  

## 2012-05-24 ENCOUNTER — Telehealth: Payer: Self-pay | Admitting: Family Medicine

## 2012-05-24 NOTE — Telephone Encounter (Signed)
Refill: Citalopram hbr 40mg tablet. Take 1 tablet by mouth at bedtime. Qty 90. Last fill 02-26-12 °

## 2012-05-24 NOTE — Telephone Encounter (Signed)
Spoke with [facility] @ pharmacy & she confirmed pt still has refills left on file. Refill request sent in error.

## 2012-06-05 ENCOUNTER — Ambulatory Visit: Payer: BC Managed Care – PPO

## 2012-08-21 LAB — HM MAMMOGRAPHY: HM Mammogram: NORMAL

## 2012-08-21 LAB — HM PAP SMEAR: HM Pap smear: NORMAL

## 2012-09-19 ENCOUNTER — Ambulatory Visit (INDEPENDENT_AMBULATORY_CARE_PROVIDER_SITE_OTHER): Payer: BC Managed Care – PPO | Admitting: Family Medicine

## 2012-09-19 ENCOUNTER — Encounter: Payer: Self-pay | Admitting: Family Medicine

## 2012-09-19 VITALS — BP 110/78 | HR 70 | Temp 98.5°F | Wt 157.0 lb

## 2012-09-19 DIAGNOSIS — M531 Cervicobrachial syndrome: Secondary | ICD-10-CM

## 2012-09-19 DIAGNOSIS — M5481 Occipital neuralgia: Secondary | ICD-10-CM

## 2012-09-19 NOTE — Assessment & Plan Note (Signed)
New.  Pt's discomfort is w/ light touch and not deep or consistent w/ migraines.  Will refer to neuro for complete evaluation and tx.  Reviewed supportive care and red flags that should prompt return.  Pt expressed understanding and is in agreement w/ plan.

## 2012-09-19 NOTE — Progress Notes (Signed)
  Subjective:    Patient ID: Amy Perkins, female    DOB: [birth year], 62 y.o.   MRN: [id number]  HPI HA- pt reports 'a throbbing in my head'.  L sided, parieto-occipital.  Mild TTP.  Improves w/ ibuprofen but then returns.  Pt reports sxs have been constant x2-3 weeks.  Has been taking ibuprofen TID regularly.  Pain rated as 2/10.  Has hx of cluster HA w/ visual changes- 2 occurences in last month.  Pain will improve w/ exercise.  No fevers.  No dizziness, nausea.  No known injury.  sxs always occur on L.   Review of Systems For ROS see HPI     Objective:   Physical Exam  Vitals reviewed. Constitutional: She is oriented to person, place, and time. She appears well-developed and well-nourished. No distress.  HENT:  Head: Normocephalic and atraumatic.       TMs WNL No TTP over sinuses Minimal nasal congestion + TTP over L occipitoparietal area w/ light touch  Eyes: Conjunctivae normal and EOM are normal. Pupils are equal, round, and reactive to light.  Neck: Normal range of motion. Neck supple.  Cardiovascular: Normal rate, regular rhythm, normal heart sounds and intact distal pulses.   Pulmonary/Chest: Effort normal and breath sounds normal. No respiratory distress. She has no wheezes. She has no rales.  Lymphadenopathy:    She has no cervical adenopathy.  Neurological: She is alert and oriented to person, place, and time. She has normal reflexes. No cranial nerve deficit. Coordination normal.  Psychiatric: She has a normal mood and affect. Her behavior is normal. Judgment and thought content normal.          Assessment & Plan:

## 2012-09-19 NOTE — Patient Instructions (Addendum)
We'll call you with your neuro appt Continue to alternate tylenol and ibuprofen for pain and tenderness Ice or heat- whichever feels better Call with any questions or concerns Hang in there!

## 2012-10-08 ENCOUNTER — Encounter: Payer: Self-pay | Admitting: Lab

## 2012-10-09 ENCOUNTER — Encounter: Payer: Self-pay | Admitting: Family Medicine

## 2012-10-09 ENCOUNTER — Ambulatory Visit (INDEPENDENT_AMBULATORY_CARE_PROVIDER_SITE_OTHER): Payer: BC Managed Care – PPO | Admitting: Family Medicine

## 2012-10-09 VITALS — BP 112/80 | HR 66 | Temp 98.5°F | Ht 63.75 in | Wt 154.4 lb

## 2012-10-09 DIAGNOSIS — E785 Hyperlipidemia, unspecified: Secondary | ICD-10-CM

## 2012-10-09 DIAGNOSIS — J0100 Acute maxillary sinusitis, unspecified: Secondary | ICD-10-CM

## 2012-10-09 LAB — HEPATIC FUNCTION PANEL
ALT: 44 U/L — ABNORMAL HIGH (ref 0–35)
AST: 28 U/L (ref 0–37)
Albumin: 4.2 g/dL (ref 3.5–5.2)
Alkaline Phosphatase: 56 U/L (ref 39–117)
Bilirubin, Direct: 0.1 mg/dL (ref 0.0–0.3)
Total Bilirubin: 0.3 mg/dL (ref 0.3–1.2)
Total Protein: 6.7 g/dL (ref 6.0–8.3)

## 2012-10-09 LAB — LIPID PANEL
Cholesterol: 155 mg/dL (ref 0–200)
HDL: 49 mg/dL (ref >39.00)
LDL Cholesterol: 85 mg/dL (ref 0–99)
Total CHOL/HDL Ratio: 3
Triglycerides: 105 mg/dL (ref 0.0–149.0)
VLDL: 21 mg/dL (ref 0.0–40.0)

## 2012-10-09 MED ORDER — AMOXICILLIN 875 MG PO TABS: 875.0000 mg | ORAL_TABLET | Freq: Two times a day (BID) | ORAL | Status: DC

## 2012-10-09 NOTE — Progress Notes (Signed)
  Subjective:    Patient ID: Amy Perkins, female    DOB: 09-03-1950, 62 y.o.   MRN: [id number]  HPI Hyperlipidemia- chronic problem, on zocor daily.  Denies abd pain, N/V, myalgias.  URI- sxs started 1 week ago.  + nasal congestion, hoarseness, dry cough.  No fevers.  No ear pain.  + frontal pain.   Review of Systems For ROS see HPI     Objective:   Physical Exam  Vitals reviewed. Constitutional: She is oriented to person, place, and time. She appears well-developed and well-nourished. No distress.  HENT:  Head: Normocephalic and atraumatic.  Right Ear: Tympanic membrane normal.  Left Ear: Tympanic membrane normal.  Nose: Mucosal edema and rhinorrhea present. Right sinus exhibits maxillary sinus tenderness and frontal sinus tenderness. Left sinus exhibits maxillary sinus tenderness and frontal sinus tenderness.  Mouth/Throat: Uvula is midline and mucous membranes are normal. Posterior oropharyngeal erythema present. No oropharyngeal exudate.  Eyes: Conjunctivae and EOM are normal. Pupils are equal, round, and reactive to light.  Neck: Normal range of motion. Neck supple.  Cardiovascular: Normal rate, regular rhythm, normal heart sounds and intact distal pulses.   Pulmonary/Chest: Effort normal and breath sounds normal. No respiratory distress. She has no wheezes.  Abdominal: Soft. Bowel sounds are normal. She exhibits no distension. There is no tenderness. There is no rebound.  Lymphadenopathy:    She has no cervical adenopathy.  Neurological: She is alert and oriented to person, place, and time.  Skin: Skin is warm and dry.          Assessment & Plan:

## 2012-10-09 NOTE — Assessment & Plan Note (Signed)
Chronic problem.  Tolerating statin w/out difficulty.  Check labs.  Adjust meds prn  

## 2012-10-09 NOTE — Patient Instructions (Addendum)
Schedule your complete physical in 6 months We'll notify you of your lab results and make any changes if needed Start the Amoxicillin twice daily- take w/ food- for the sinus infection Drink plenty of fluids Add mucinex to thin your congestion REST! Hang in there!

## 2012-10-09 NOTE — Assessment & Plan Note (Signed)
New.  Pt's sxs and PE consistent w/ infxn.  Start abx.  Reviewed supportive care and red flags that should prompt return.  Pt expressed understanding and is in agreement w/ plan.  

## 2012-10-11 ENCOUNTER — Telehealth: Payer: Self-pay | Admitting: *Deleted

## 2012-10-11 DIAGNOSIS — R945 Abnormal results of liver function studies: Secondary | ICD-10-CM

## 2012-10-11 NOTE — Telephone Encounter (Signed)
Spoke with the pt and informed her of recent lab results and note.  Pt understood and agreed, and lab appt scheduled.  Lab order sent.//AB/CMA

## 2012-10-11 NOTE — Telephone Encounter (Signed)
Message copied by Verdie Shire on Fri Oct 11, 2012 10:55 AM ------      Message from: Sheliah Hatch      Created: Thu Oct 10, 2012  4:54 PM       ALT is mildly elevated but no changes at this time.  Will need repeat LFTs in 3 months (lab only, dx abnormal LFTs)      Lipids look great ------

## 2012-12-25 ENCOUNTER — Other Ambulatory Visit: Payer: Self-pay | Admitting: Family Medicine

## 2012-12-25 DIAGNOSIS — E785 Hyperlipidemia, unspecified: Secondary | ICD-10-CM

## 2012-12-25 NOTE — Telephone Encounter (Signed)
Refill for Zocor sent to [facility] pharmacy

## 2012-12-31 ENCOUNTER — Other Ambulatory Visit: Payer: Self-pay | Admitting: Family Medicine

## 2013-01-02 NOTE — Telephone Encounter (Signed)
Rx sent to the pharmacy by e-script.//AB/CMA 

## 2013-01-10 ENCOUNTER — Other Ambulatory Visit: Payer: BC Managed Care – PPO

## 2013-01-14 ENCOUNTER — Other Ambulatory Visit (INDEPENDENT_AMBULATORY_CARE_PROVIDER_SITE_OTHER): Payer: BC Managed Care – PPO

## 2013-01-14 DIAGNOSIS — R945 Abnormal results of liver function studies: Secondary | ICD-10-CM

## 2013-01-14 DIAGNOSIS — R7989 Other specified abnormal findings of blood chemistry: Secondary | ICD-10-CM

## 2013-01-14 LAB — HEPATIC FUNCTION PANEL
ALT: 22 U/L (ref 0–35)
AST: 17 U/L (ref 0–37)
Albumin: 4.1 g/dL (ref 3.5–5.2)
Alkaline Phosphatase: 50 U/L (ref 39–117)
Bilirubin, Direct: 0.1 mg/dL (ref 0.0–0.3)
Total Bilirubin: 0.4 mg/dL (ref 0.3–1.2)
Total Protein: 6.7 g/dL (ref 6.0–8.3)

## 2013-01-16 ENCOUNTER — Encounter: Payer: Self-pay | Admitting: General Practice

## 2013-04-24 ENCOUNTER — Ambulatory Visit (INDEPENDENT_AMBULATORY_CARE_PROVIDER_SITE_OTHER): Payer: BC Managed Care – PPO | Admitting: Nurse Practitioner

## 2013-04-24 ENCOUNTER — Encounter: Payer: Self-pay | Admitting: Nurse Practitioner

## 2013-04-24 VITALS — BP 108/66 | HR 55 | Ht 64.0 in | Wt 152.0 lb

## 2013-04-24 DIAGNOSIS — G43909 Migraine, unspecified, not intractable, without status migrainosus: Secondary | ICD-10-CM

## 2013-04-24 MED ORDER — TOPIRAMATE 25 MG PO TABS: 25.0000 mg | ORAL_TABLET | Freq: Two times a day (BID) | ORAL | Status: DC

## 2013-04-24 NOTE — Patient Instructions (Addendum)
Increase Topamax to 1 tablet in the a.m., 2 at at bedtime Continue Maxalt acutely Given list of migraine triggers, Followup in 6 months and when necessary

## 2013-04-24 NOTE — Progress Notes (Signed)
Reason for visit: follow up for migraines  HPI-Amy Perkins, 62 year old left handed Caucasian female, returns for followup. She was last in the office by Dr. Terrace Arabia 10/22/2012. On return visit today she continues to complain of a throbbing headache. She has not taken her Maxalt recently. She says that she takes  anxiety medicine and the headache is improved. She claims she has a headache recently on a daily basis in varying degrees. She is taking Topamax 25 mg twice daily.     History:She has past medical history of migraine, hyperlipidemia, anxiety, depression, is a retired Chartered loss adjuster  Since January 2014, without clear trigger event, she began to have constant low grade  2 out of 10 throbbing headaches, started at left parietal region,  also involve both parietal region, during last months, she also had 2 episodes of her typical migraine, blurry vision, followed by light sensitivity, noise sensitivity, mild nausea, low-grade headache, worsening by movement, relieved by resting, and take ibuprofen.  She denies jaw claudication, no chewing difficulty, no diffuse muscle achy pain  UPDATE March 4th 2014:She had 3 episodes of migraine headache since last visit in September 27 2012, related to her sinus infection, relieved by ibuprofen, Tylenol in 30 minutes, while she was taking topiramate 25 mg 2 tablets twice a day, her headache has much improved, only intermittent mild left parietal area throbbing low-grade headaches, no significant side effect    Review of Systems  Out of a complete 14 system review, the patient complains of only the following symptoms, and all other reviewed systems are negative. Joint pain, headache, depression anxiety    Social History  Patient lives at home alone and she is divorced. Patient  works  for [employer] and has her masters degree. Patient denies any history of tobacco and illicit drugs. Patient drinks wine occasionally . Patient drinks   caffeine. Inhaled Tobacco Use: never smoker  Family History  Patients parents are both deceased. Heart attack Diabetes Heart Brother recently had heart surgery  Past Medical History  High cholesterol Migraine Depression Anxiety  Surgical History  DNC Knee  Tonsils   Physical Exam  General:  Seated in no apparent distress Neck: supple no carotid bruits Respiratory: clear to auscultation bilaterally Cardiovascular: regular rate rhythm  Neurologic Exam  Mental Status: pleasant, awake, alert, cooperative to history, speech and language normal  Cranial Nerves: CN II-XII pupils were equal round reactive to light.  Extraocular movements were full.  Visual fields were full on confrontational test.  Facial sensation and strength were normal.  Hearing was intact  Uvula tongue were midline.  Head turning and shoulder shrugging were normal and symmetric.  Tongue protrusion into the cheeks strength were normal.  Motor: Normal tone, bulk, and strength. No focal weakness Sensory: Normal to light touch, pinprick, proprioception, and vibratory sensation. Coordination: Normal finger-to-nose, heel-to-shin.  There was no dysmetria noticed. Gait and Station: Narrow based and steady, was able to perform tiptoe, heel, and tandem walking without difficulty.   Reflexes: Deep tendon reflexes: Biceps: 2/2, Brachioradialis: 2/2, Triceps: 2/2, Pateller: 2/2, Achilles: 2/2.  Plantar responses are flexor.   Assessment 61 year old with history of migraine headaches, which at times daily at this point in varying degrees of symptoms consistent with her previous migraine    Plan Increase Topamax to 1 tablet in the a.m., 2 at at bedtime Continue Maxalt acutely Given list of migraine triggers, Followup in 6 months and when necessary Amy Perkins, [facility], [facility], APRN

## 2013-07-08 ENCOUNTER — Ambulatory Visit (INDEPENDENT_AMBULATORY_CARE_PROVIDER_SITE_OTHER): Payer: BC Managed Care – PPO

## 2013-07-08 DIAGNOSIS — Z23 Encounter for immunization: Secondary | ICD-10-CM

## 2013-07-22 ENCOUNTER — Other Ambulatory Visit: Payer: Self-pay | Admitting: Obstetrics and Gynecology

## 2013-09-11 ENCOUNTER — Telehealth: Payer: Self-pay

## 2013-09-11 NOTE — Telephone Encounter (Signed)
Left message for call back. Identifiable  Pap- 07/22/13 negative CCS- 03/28/10- Benign polyp [facility]- Dr. Helane Rima Flu-07/08/13 Tdap- 06/28/11

## 2013-09-15 ENCOUNTER — Encounter: Payer: Self-pay | Admitting: General Practice

## 2013-09-15 ENCOUNTER — Encounter: Payer: Self-pay | Admitting: Family Medicine

## 2013-09-15 ENCOUNTER — Ambulatory Visit (INDEPENDENT_AMBULATORY_CARE_PROVIDER_SITE_OTHER): Payer: BC Managed Care – PPO | Admitting: Family Medicine

## 2013-09-15 VITALS — BP 126/72 | HR 57 | Temp 98.1°F | Resp 16 | Ht 63.5 in | Wt 145.4 lb

## 2013-09-15 DIAGNOSIS — M25539 Pain in unspecified wrist: Secondary | ICD-10-CM

## 2013-09-15 DIAGNOSIS — Z0000 Encounter for general adult medical examination without abnormal findings: Secondary | ICD-10-CM

## 2013-09-15 LAB — HEPATIC FUNCTION PANEL
ALT: 22 U/L (ref 0–35)
AST: 18 U/L (ref 0–37)
Albumin: 4.1 g/dL (ref 3.5–5.2)
Alkaline Phosphatase: 52 U/L (ref 39–117)
Bilirubin, Direct: 0 mg/dL (ref 0.0–0.3)
Total Bilirubin: 0.5 mg/dL (ref 0.3–1.2)
Total Protein: 6.5 g/dL (ref 6.0–8.3)

## 2013-09-15 LAB — CBC WITH DIFFERENTIAL/PLATELET
Basophils Absolute: 0 10*3/uL (ref 0.0–0.1)
Basophils Relative: 0.5 % (ref 0.0–3.0)
Eosinophils Absolute: 0.1 10*3/uL (ref 0.0–0.7)
Eosinophils Relative: 1.9 % (ref 0.0–5.0)
HCT: 43.2 % (ref 36.0–46.0)
Hemoglobin: 14.5 g/dL (ref 12.0–15.0)
Lymphocytes Relative: 29.4 % (ref 12.0–46.0)
Lymphs Abs: 1.5 10*3/uL (ref 0.7–4.0)
MCHC: 33.5 g/dL (ref 30.0–36.0)
MCV: 90.8 fl (ref 78.0–100.0)
Monocytes Absolute: 0.3 10*3/uL (ref 0.1–1.0)
Monocytes Relative: 6.4 % (ref 3.0–12.0)
Neutro Abs: 3.1 10*3/uL (ref 1.4–7.7)
Neutrophils Relative %: 61.8 % (ref 43.0–77.0)
Platelets: 196 10*3/uL (ref 150.0–400.0)
RBC: 4.75 Mil/uL (ref 3.87–5.11)
RDW: 13.4 % (ref 11.5–14.6)
WBC: 4.9 10*3/uL (ref 4.5–10.5)

## 2013-09-15 LAB — BASIC METABOLIC PANEL
BUN: 18 mg/dL (ref 6–23)
CO2: 27 mEq/L (ref 19–32)
Calcium: 9.5 mg/dL (ref 8.4–10.5)
Chloride: 108 mEq/L (ref 96–112)
Creatinine, Ser: 0.9 mg/dL (ref 0.4–1.2)
GFR: 71.66 mL/min (ref >60.00)
Glucose, Bld: 100 mg/dL — ABNORMAL HIGH (ref 70–99)
Potassium: 3.9 mEq/L (ref 3.5–5.1)
Sodium: 142 mEq/L (ref 135–145)

## 2013-09-15 LAB — LIPID PANEL
Cholesterol: 149 mg/dL (ref 0–200)
HDL: 51.1 mg/dL (ref >39.00)
LDL Cholesterol: 80 mg/dL (ref 0–99)
Total CHOL/HDL Ratio: 3
Triglycerides: 92 mg/dL (ref 0.0–149.0)
VLDL: 18.4 mg/dL (ref 0.0–40.0)

## 2013-09-15 LAB — TSH: TSH: 2.36 u[IU]/mL (ref 0.35–5.50)

## 2013-09-15 NOTE — Assessment & Plan Note (Signed)
Pt's PE WNL.  UTD on health maintenance.  Check labs.  Anticipatory guidance provided.  

## 2013-09-15 NOTE — Progress Notes (Signed)
   Subjective:    Patient ID: Amy Perkins, female    DOB: February 18, 1951, 63 y.o.   MRN: [id number]  HPI CPE- UTD on pap, mammo, colonoscopy.  L [facility]- sxs started late November after lifting heavy weight.  Taking Ibuprofen w/ some relief.  Now having pain w/ writing (L hand dominant)  Review of Systems Patient reports no vision/ hearing changes, adenopathy,fever, weight change,  persistant/recurrent hoarseness , swallowing issues, chest pain, palpitations, edema, persistant/recurrent cough, hemoptysis, dyspnea (rest/exertional/paroxysmal nocturnal), gastrointestinal bleeding (melena, rectal bleeding), abdominal pain, significant heartburn, bowel changes, GU symptoms (dysuria, hematuria, incontinence), Gyn symptoms (abnormal  bleeding, pain),  syncope, focal weakness, memory loss, numbness & tingling, skin/hair/nail changes, abnormal bruising or bleeding, anxiety, or depression.     Objective:   Physical Exam General Appearance:    Alert, cooperative, no distress, appears stated age  Head:    Normocephalic, without obvious abnormality, atraumatic  Eyes:    PERRL, conjunctiva/corneas clear, EOM's intact, fundi    benign, both eyes  Ears:    Normal TM's and external ear canals, both ears  Nose:   Nares normal, septum midline, mucosa normal, no drainage    or sinus tenderness  Throat:   Lips, mucosa, and tongue normal; teeth and gums normal  Neck:   Supple, symmetrical, trachea midline, no adenopathy;    Thyroid: no enlargement/tenderness/nodules  Back:     Symmetric, no curvature, ROM normal, no CVA tenderness  Lungs:     Clear to auscultation bilaterally, respirations unlabored  Chest Wall:    No tenderness or deformity   Heart:    Regular rate and rhythm, S1 and S2 normal, no murmur, rub   or gallop  Breast Exam:    Deferred to GYN  Abdomen:     Soft, non-tender, bowel sounds active all four quadrants,    no masses, no organomegaly  Genitalia:    Deferred to GYN  Rectal:      Extremities:   Extremities normal, atraumatic, no cyanosis or edema.  + TTP over L MCC joint  Pulses:   2+ and symmetric all extremities  Skin:   Skin color, texture, turgor normal, no rashes or lesions  Lymph nodes:   Cervical, supraclavicular, and axillary nodes normal  Neurologic:   CNII-XII intact, normal strength, sensation and reflexes    throughout          Assessment & Plan:

## 2013-09-15 NOTE — Patient Instructions (Signed)
Follow up in 6 months to recheck cholesterol We'll notify you of your lab results and make any changes if needed Keep up the good work! We'll call you with your ortho appt Call with any questions or concerns Hang in there!!!

## 2013-09-15 NOTE — Assessment & Plan Note (Signed)
Pt w/ CMC joint pain on L- her dominant hand.  Refer to ortho for evaluation and management.

## 2013-09-15 NOTE — Progress Notes (Signed)
Pre visit review using our clinic review tool, if applicable. No additional management support is needed unless otherwise documented below in the visit note. 

## 2013-09-16 NOTE — Telephone Encounter (Signed)
Unable to reach pre visit.  

## 2013-09-18 LAB — VITAMIN D 1,25 DIHYDROXY
Vitamin D 1, 25 (OH)2 Total: 45 pg/mL (ref 18–72)
Vitamin D2 1, 25 (OH)2: <8 pg/mL
Vitamin D3 1, 25 (OH)2: 45 pg/mL

## 2013-09-19 ENCOUNTER — Encounter: Payer: Self-pay | Admitting: General Practice

## 2013-10-13 ENCOUNTER — Encounter: Payer: Self-pay | Admitting: Physician Assistant

## 2013-10-13 ENCOUNTER — Ambulatory Visit (INDEPENDENT_AMBULATORY_CARE_PROVIDER_SITE_OTHER): Payer: BC Managed Care – PPO | Admitting: Physician Assistant

## 2013-10-13 VITALS — BP 111/66 | HR 70 | Temp 99.3°F | Wt 147.0 lb

## 2013-10-13 DIAGNOSIS — J209 Acute bronchitis, unspecified: Secondary | ICD-10-CM

## 2013-10-13 MED ORDER — SULFAMETHOXAZOLE-TRIMETHOPRIM 800-160 MG PO TABS: 1.0000 | ORAL_TABLET | Freq: Two times a day (BID) | ORAL | Status: DC

## 2013-10-13 NOTE — Progress Notes (Signed)
Patient presents to clinic today c/o > 1 week of chills, nasal congestion, rhinorrhea and worsening cough.  Patient endorses gradual onset of symptoms.  Patient endorses some chest tenderness 2/2 persistent cough.  Cough has started to become productive.  Patient endorses a fever that began yesterday.  Endorses taking tylenol to lower temperature.  Last dose at 9 am.  Temp is 99.3 presently.  Patient denies recent travel or sick contact.  Denies shortness of breath of wheezing.   Past Medical History  Diagnosis Date  . Depression   . Hyperlipidemia   . Migraines     Current Outpatient Prescriptions on File Prior to Visit  Medication Sig Dispense Refill  . ALPRAZolam (XANAX) 0.5 MG tablet Take 0.5 mg by mouth 4 (four) times daily as needed.        Marland Kitchen aspirin 81 MG tablet Take 81 mg by mouth daily.        . Biotin 1000 MCG tablet Take 1,000 mcg by mouth daily.        . Calcium Carbonate (CALCIUM 600) 1500 MG TABS Take by mouth 2 (two) times daily.        . citalopram (CELEXA) 40 MG tablet Take 1 tablet (40 mg total) by mouth daily.  30 tablet  5  . Ibuprofen 200 MG CAPS Take by mouth 3 (three) times daily.       . Multiple Vitamin (MULTIVITAMIN) tablet Take 1 tablet by mouth daily.        . rizatriptan (MAXALT) 10 MG tablet Take 10 mg by mouth as needed for migraine. May repeat in 2 hours if needed      . simvastatin (ZOCOR) 40 MG tablet TAKE 1 TABLET (40 MG TOTAL) BY MOUTH AT BEDTIME.  90 tablet  1  . topiramate (TOPAMAX) 50 MG tablet Take 50 mg by mouth 2 (two) times daily.       No current facility-administered medications on file prior to visit.    No Known Allergies  Family History  Problem Relation Age of Onset  . Coronary artery disease Father   . Stroke Father   . Parkinson's disease Father   . Hypertension Mother   . Diabetes Mother   . Stroke Mother   . Coronary artery disease Brother     History   Social History  . Marital Status: Divorced    Spouse Name: N/A   Number of Children: 2  . Years of Education: college   Occupational History  . Teacher     [employer]   Social History Main Topics  . Smoking status: Never Smoker   . Smokeless tobacco: Never Used  . Alcohol Use: 0.6 oz/week    1 Glasses of wine per week     Comment: wine daily -OCC  . Drug Use: No  . Sexual Activity: No   Other Topics Concern  . None   Social History Narrative   Divorced, both children live locally.   Patient works part time at [employer]. College education.   Left handed.   Caffeine- two cups coffee daily.    Review of Systems - See HPI.  All other ROS are negative.  BP 111/66  Pulse 70  Temp(Src) 99.3 F (37.4 C) (Oral)  Wt 147 lb (66.679 kg)  SpO2 96%  Physical Exam  Vitals reviewed. Constitutional: She is oriented to person, place, and time and well-developed, well-nourished, and in no distress.  HENT:  Head: Normocephalic and atraumatic.  Right Ear: External ear normal.  Left Ear: External ear normal.  Nose: Nose normal.  Mouth/Throat: Oropharynx is clear and moist. No oropharyngeal exudate.  L TM within normal limits.  R TM not visualized due to excessive cerumen.  No TTP of sinuses.  Eyes: Conjunctivae are normal. Pupils are equal, round, and reactive to light.  Neck: Neck supple.  Cardiovascular: Normal rate, regular rhythm, normal heart sounds and intact distal pulses.   Pulmonary/Chest: Effort normal and breath sounds normal. No respiratory distress. She has no wheezes. She has no rales. She exhibits no tenderness.  Lymphadenopathy:    She has no cervical adenopathy.  Neurological: She is alert and oriented to person, place, and time.  Skin: Skin is warm and dry. No rash noted.  Psychiatric: Affect normal.    Recent Results (from the past 2160 hour(s))  CBC WITH DIFFERENTIAL     Status: None   Collection Time    09/15/13  9:23 AM      Result Value Ref Range   WBC 4.9  4.5 - 10.5 K/uL   RBC 4.75  3.87 - 5.11 Mil/uL    Hemoglobin 14.5  12.0 - 15.0 g/dL   HCT 43.2  36.0 - 46.0 %   MCV 90.8  78.0 - 100.0 fl   MCHC 33.5  30.0 - 36.0 g/dL   RDW 13.4  11.5 - 14.6 %   Platelets 196.0  150.0 - 400.0 K/uL   Neutrophils Relative % 61.8  43.0 - 77.0 %   Lymphocytes Relative 29.4  12.0 - 46.0 %   Monocytes Relative 6.4  3.0 - 12.0 %   Eosinophils Relative 1.9  0.0 - 5.0 %   Basophils Relative 0.5  0.0 - 3.0 %   Neutro Abs 3.1  1.4 - 7.7 K/uL   Lymphs Abs 1.5  0.7 - 4.0 K/uL   Monocytes Absolute 0.3  0.1 - 1.0 K/uL   Eosinophils Absolute 0.1  0.0 - 0.7 K/uL   Basophils Absolute 0.0  0.0 - 0.1 K/uL  BASIC METABOLIC PANEL     Status: Abnormal   Collection Time    09/15/13  9:23 AM      Result Value Ref Range   Sodium 142  135 - 145 mEq/L   Potassium 3.9  3.5 - 5.1 mEq/L   Chloride 108  96 - 112 mEq/L   CO2 27  19 - 32 mEq/L   Glucose, Bld 100 (*) 70 - 99 mg/dL   BUN 18  6 - 23 mg/dL   Creatinine, Ser 0.9  0.4 - 1.2 mg/dL   Calcium 9.5  8.4 - 10.5 mg/dL   GFR 71.66  >60.00 mL/min  HEPATIC FUNCTION PANEL     Status: None   Collection Time    09/15/13  9:23 AM      Result Value Ref Range   Total Bilirubin 0.5  0.3 - 1.2 mg/dL   Bilirubin, Direct 0.0  0.0 - 0.3 mg/dL   Alkaline Phosphatase 52  39 - 117 U/L   AST 18  0 - 37 U/L   ALT 22  0 - 35 U/L   Total Protein 6.5  6.0 - 8.3 g/dL   Albumin 4.1  3.5 - 5.2 g/dL  TSH     Status: None   Collection Time    09/15/13  9:23 AM      Result Value Ref Range   TSH 2.36  0.35 - 5.50 uIU/mL  LIPID PANEL     Status: None   Collection  Time    09/15/13  9:23 AM      Result Value Ref Range   Cholesterol 149  0 - 200 mg/dL   Comment: ATP III Classification       Desirable:  < 200 mg/dL               Borderline High:  200 - 239 mg/dL          High:  > = 240 mg/dL   Triglycerides 92.0  0.0 - 149.0 mg/dL   Comment: Normal:  <150 mg/dLBorderline High:  150 - 199 mg/dL   HDL 51.10  >39.00 mg/dL   VLDL 18.4  0.0 - 40.0 mg/dL   LDL Cholesterol 80  0 - 99 mg/dL    Total CHOL/HDL Ratio 3     Comment:                Men          Women1/2 Average Risk     3.4          3.3Average Risk          5.0          4.42X Average Risk          9.6          7.13X Average Risk          15.0          11.0                      VITAMIN D 1,25 DIHYDROXY     Status: None   Collection Time    09/15/13  9:23 AM      Result Value Ref Range   Vitamin D 1, 25 (OH)2 Total 45  18 - 72 pg/mL   Vitamin D3 1, 25 (OH)2 45     Vitamin D2 1, 25 (OH)2 <8     Comment: Vitamin D3, 1,25(OH)2 indicates both endogenous     production and supplementation.  Vitamin D2, 1,25(OH)2     is an indicator of exogeous sources, such as diet or     supplementation.  Interpretation and therapy are based     on measurement of Vitamin D,1,25(OH)2, Total.     This test was developed and its performance     characteristics have been determined by [facility], [place], [place].     Performance characteristics refer to the     analytical performance of the test.    Assessment/Plan: Acute bronchitis Rx Bactrim due to risk of Qt prolongation with combo of Azithromycin and Celexa.  Increase fluids.  Rest.  Delsym for cough.  Tylenol for fever.  Mucinex prn as directed for chest congestion.  Return to clinic for re-evaluation and CXR if no symptom improvement over the next 2 days.

## 2013-10-13 NOTE — Patient Instructions (Signed)
Please take antibiotic as prescribed.  Increase fluid intake.  Take Mucinex-D for congestion.  Rest.  Use saline nasal spray to flush out sinuses.  Place a humidifier in the bedroom.  Tylenol for fever.  Please return to clinic if symptoms not improving over the next few days, or if symptoms acutely worsen.

## 2013-10-13 NOTE — Assessment & Plan Note (Signed)
Rx Bactrim due to risk of Qt prolongation with combo of Azithromycin and Celexa.  Increase fluids.  Rest.  Delsym for cough.  Tylenol for fever.  Mucinex prn as directed for chest congestion.  Return to clinic for re-evaluation and CXR if no symptom improvement over the next 2 days.

## 2013-10-22 ENCOUNTER — Ambulatory Visit (INDEPENDENT_AMBULATORY_CARE_PROVIDER_SITE_OTHER): Payer: BC Managed Care – PPO | Admitting: Nurse Practitioner

## 2013-10-22 ENCOUNTER — Encounter: Payer: Self-pay | Admitting: Nurse Practitioner

## 2013-10-22 ENCOUNTER — Encounter (INDEPENDENT_AMBULATORY_CARE_PROVIDER_SITE_OTHER): Payer: Self-pay

## 2013-10-22 VITALS — BP 124/69 | HR 59 | Ht 64.5 in | Wt 146.0 lb

## 2013-10-22 DIAGNOSIS — G43909 Migraine, unspecified, not intractable, without status migrainosus: Secondary | ICD-10-CM

## 2013-10-22 DIAGNOSIS — G4710 Hypersomnia, unspecified: Secondary | ICD-10-CM

## 2013-10-22 DIAGNOSIS — R400 Somnolence: Secondary | ICD-10-CM | POA: Insufficient documentation

## 2013-10-22 MED ORDER — TOPIRAMATE 50 MG PO TABS: 50.0000 mg | ORAL_TABLET | Freq: Two times a day (BID) | ORAL | Status: DC

## 2013-10-22 NOTE — Patient Instructions (Addendum)
Continue Topamax 50 mg twice daily, renewed Continue Maxalt acutely Will set up for sleep study

## 2013-10-22 NOTE — Progress Notes (Signed)
[facility]  PATIENT: JESSEY HUYETT DOB: [birth year]   REASON FOR VISIT: for follow up migraines   HISTORY OF PRESENT ILLNESS: Ms. Borunda, 63 year old female returns for followup. She has a history of migraines that are in  good control with Topamax. She has not had to use for Maxalt very much. She has a new complaint today of daytime sleepiness. She snores at night but is not sure if she quits breathing or not. She has a recent injury to her left hand for which she received a steroid injection last month and continues to have pain over the left thumb area. She returns for reevaluation  HISTORY:migraine , anxiety and hyperlipidemia and depression, is retired Education officer, [employer]. Since January 2014, without clear trigger event, she began to have constant low grade 2 out of 10 throbbing headaches, started at left parietal region, also involve both parietal region, during last months, she also had 2 episodes of her typical migraine, blurry vision, followed by light sensitivity, noise sensitivity, mild nausea, low-grade headache, worsening by movement, relieved by resting, and take ibuprofen.  She denies jaw claudication, no chewing difficulty, no diffuse muscle achy pain  UPDATE March 4th 2014:She had 3 episodes of migraine headache since last visit in September 27 2012, related to her sinus infection, relieved by ibuprofen, Tylenol in 30 minutes, while she was taking topiramate 25 mg 2 tablets twice a day, her headache has much improved, only intermittent mild left parietal area throbbing low-grade headaches, no significant side effect  REVIEW OF SYSTEMS: Full 14 system review of systems performed and notable only for those listed, all others are neg:  Constitutional: N/A  Cardiovascular: N/A  Ear/Nose/Throat: N/A  Skin: N/A  Eyes: N/A  Respiratory: N/A  Gastroitestinal: N/A  Hematology/Lymphatic: N/A  Endocrine: N/A Musculoskeletal: Joint pain  Allergy/Immunology: N/A    Neurological: N/A Psychiatric: Depression anxiety  Sleep Day time sleepiness   ALLERGIES: No Known Allergies  HOME MEDICATIONS: Outpatient Prescriptions Prior to Visit  Medication Sig Dispense Refill  . ALPRAZolam (XANAX) 0.5 MG tablet Take 0.5 mg by mouth 4 (four) times daily as needed.        Marland Kitchen aspirin 81 MG tablet Take 81 mg by mouth daily.        . Biotin 1000 MCG tablet Take 1,000 mcg by mouth daily.        . Calcium Carbonate (CALCIUM 600) 1500 MG TABS Take by mouth 2 (two) times daily.        . citalopram (CELEXA) 40 MG tablet Take 1 tablet (40 mg total) by mouth daily.  30 tablet  5  . Ibuprofen 200 MG CAPS Take by mouth 3 (three) times daily.       . Multiple Vitamin (MULTIVITAMIN) tablet Take 1 tablet by mouth daily.        . simvastatin (ZOCOR) 40 MG tablet TAKE 1 TABLET (40 MG TOTAL) BY MOUTH AT BEDTIME.  90 tablet  1  . topiramate (TOPAMAX) 50 MG tablet Take 50 mg by mouth 2 (two) times daily.      . rizatriptan (MAXALT) 10 MG tablet Take 10 mg by mouth as needed for migraine. May repeat in 2 hours if needed      . sulfamethoxazole-trimethoprim (BACTRIM DS,SEPTRA DS) 800-160 MG per tablet Take 1 tablet by mouth 2 (two) times daily.  14 tablet  0   No facility-administered medications prior to visit.    PAST MEDICAL HISTORY: Past Medical History  Diagnosis Date  .  Depression   . Hyperlipidemia   . Migraines     PAST SURGICAL HISTORY: Past Surgical History  Procedure Laterality Date  . Laser vein surgery    . Tonsillectomy    . Dilation and curettage of uterus      x2    FAMILY HISTORY: Family History  Problem Relation Age of Onset  . Coronary artery disease Father   . Stroke Father   . Parkinson's disease Father   . Hypertension Mother   . Diabetes Mother   . Stroke Mother   . Coronary artery disease Brother     SOCIAL HISTORY: History   Social History  . Marital Status: Divorced    Spouse Name: N/A    Number of Children: 2  . Years of  Education: college   Occupational History  . Teacher     [employer]   Social History Main Topics  . Smoking status: Never Smoker   . Smokeless tobacco: Never Used  . Alcohol Use: 0.6 oz/week    1 Glasses of wine per week     Comment: wine daily -OCC  . Drug Use: No  . Sexual Activity: No   Other Topics Concern  . Not on file   Social History Narrative   Divorced, both children live locally.   Patient works part time at [employer]. College education.   Left handed.   Caffeine- two cups coffee daily.     PHYSICAL EXAM  Filed Vitals:   10/22/13 1013  BP: 124/69  Pulse: 59  Height: 5' 4.5" (1.638 m)  Weight: 146 lb (66.225 kg)   Body mass index is 24.68 kg/(m^2).  Generalized: Well developed, in no acute distress  Head: normocephalic and atraumatic,. Mallompatti 4  Neck: Supple, no carotid bruits neck 15.5 inches Cardiac: Regular rate rhythm, no murmur  Musculoskeletal: No deformity   Neurological examination   Mentation: Alert oriented to time, place, history taking. Follows all commands speech and language fluent. ESS 10  Cranial nerve II-XII: Pupils were equal round reactive to light extraocular movements were full, visual field were full on confrontational test. Facial sensation and strength were normal. hearing was intact to finger rubbing bilaterally. Uvula tongue midline. head turning and shoulder shrug were normal and symmetric.Tongue protrusion into cheek strength was normal. Motor: normal bulk and tone, full strength in the BUE, BLE, fine finger movements normal, no pronator drift. No focal weakness Sensory: normal and symmetric to light touch, pinprick, and  vibration  Coordination: finger-nose-finger, heel-to-shin bilaterally, no dysmetria Reflexes: Brachioradialis 2/2, biceps 2/2, triceps 2/2, patellar 2/2, Achilles 2/2, plantar responses were flexor bilaterally. Gait and Station: Rising up from seated position without assistance, normal stance,   moderate stride, good arm swing, smooth turning, able to perform tiptoe, and heel walking without difficulty. Tandem gait is steady  DIAGNOSTIC DATA (LABS, IMAGING, TESTING) - I reviewed patient records, labs, notes, testing and imaging myself where available.  Lab Results  Component Value Date   WBC 4.9 09/15/2013   HGB 14.5 09/15/2013   HCT 43.2 09/15/2013   MCV 90.8 09/15/2013   PLT 196.0 09/15/2013      Component Value Date/Time   NA 142 09/15/2013 0923   K 3.9 09/15/2013 0923   CL 108 09/15/2013 0923   CO2 27 09/15/2013 0923   GLUCOSE 100* 09/15/2013 0923   GLUCOSE 56 02/08/2010   BUN 18 09/15/2013 0923   CREATININE 0.9 09/15/2013 0923   CALCIUM 9.5 09/15/2013 0923   PROT 6.5 09/15/2013 1601  ALBUMIN 4.1 09/15/2013 0923   AST 18 09/15/2013 0923   ALT 22 09/15/2013 0923   ALKPHOS 52 09/15/2013 0923   BILITOT 0.5 09/15/2013 0923   GFRNONAA 90.72 02/15/2010 0849   Lab Results  Component Value Date   CHOL 149 09/15/2013   HDL 51.10 09/15/2013   LDLCALC 80 09/15/2013   TRIG 92.0 09/15/2013   CHOLHDL 3 09/15/2013    Lab Results  Component Value Date   TSH 2.36 09/15/2013      ASSESSMENT AND PLAN  63 y.o. year old female  has a past medical history of Depression; Hyperlipidemia; and Migraines. here in followup. She has a new complaint of daytime sleepiness.   Continue Topamax 50 mg twice daily, renewed Continue Maxalt acutely Will set up for sleep study Dennie Bible, [facility], [facility], [facility] [address] [place], [place] [address] [phone]

## 2013-10-27 ENCOUNTER — Telehealth: Payer: Self-pay | Admitting: Neurology

## 2013-10-27 DIAGNOSIS — R51 Headache: Secondary | ICD-10-CM

## 2013-10-27 DIAGNOSIS — R400 Somnolence: Secondary | ICD-10-CM

## 2013-10-27 DIAGNOSIS — R519 Headache, unspecified: Secondary | ICD-10-CM

## 2013-10-27 NOTE — Telephone Encounter (Signed)
Amy Perkins, refers patient for attended sleep study.  Height: 5'4.5  Weight: 146 lbs.  BMI: 24.68  Past Medical History: Depression  Hyperlipidemia  Migraines    Sleep Symptoms: She has a new complaint of daytime sleepiness.   Epworth Score: 10  Medications: ALPRAZolam (Tab) XANAX 0.5 MG Take 0.5 mg by mouth 4 (four) times daily as needed. Aspirin (Tab) aspirin 81 MG Take 81 mg by mouth daily. Biotin (Tab) Biotin 1000 MCG Take 1,000 mcg by mouth daily. Calcium Carbonate (Tab) Calcium Carbonate 1500 MG Take by mouth 2 (two) times daily. Citalopram Hydrobromide (Tab) CELEXA 40 MG Take 1 tablet (40 mg total) by mouth daily. Ibuprofen (Cap) Ibuprofen 200 MG Take by mouth 3 (three) times daily. Multiple Vitamin (Tab) multivitamin Take 1 tablet by mouth daily. Rizatriptan Benzoate (Tab) MAXALT 10 MG Take 10 mg by mouth as needed for migraine. May repeat in 2 hours if needed Simvastatin (Tab) ZOCOR 40 MG TAKE 1 TABLET (40 MG TOTAL) BY MOUTH AT BEDTIME. Topiramate (Tab) TOPAMAX 50 MG Take 1 tablet (50 mg total) by mouth 2 (two) times daily.    Insurance: [employer] ASSESSMENT AND PLAN:  63 y.o. year old female has a past medical history of Depression; Hyperlipidemia; and Migraines. here in followup. She has a new complaint of daytime sleepiness.      Please review patient information and submit instructions for scheduling and orders for sleep technologist.  Thank you!

## 2013-10-29 NOTE — Telephone Encounter (Signed)
This patient is referred by Cecille Rubin, nurse practitioner, for a sleep study due to a report of daytime somnolence and recurrent headaches. I will order a split-night sleep study and see the patient in sleep medicine consultation afterwards.

## 2013-11-06 ENCOUNTER — Telehealth: Payer: Self-pay | Admitting: Neurology

## 2013-11-06 NOTE — Telephone Encounter (Signed)
Pt called is wanting to see Dr. Krista Blue instead of NP/CM. I wanted to see if I can put her in a NP slot. If so let me know and I will call her back to schedule her with Dr. Krista Blue. Thanks

## 2013-11-18 NOTE — Telephone Encounter (Signed)
Patient was last seen by CM on 11-11-13. If she is wanting her follow-up visit to be with Krista Blue that is fine. Please schedule.

## 2013-11-26 ENCOUNTER — Ambulatory Visit (INDEPENDENT_AMBULATORY_CARE_PROVIDER_SITE_OTHER): Payer: BC Managed Care – PPO

## 2013-11-26 DIAGNOSIS — R51 Headache: Secondary | ICD-10-CM

## 2013-11-26 DIAGNOSIS — G4720 Circadian rhythm sleep disorder, unspecified type: Secondary | ICD-10-CM

## 2013-11-26 DIAGNOSIS — R400 Somnolence: Secondary | ICD-10-CM

## 2013-11-26 DIAGNOSIS — R0989 Other specified symptoms and signs involving the circulatory and respiratory systems: Secondary | ICD-10-CM

## 2013-11-26 DIAGNOSIS — R0609 Other forms of dyspnea: Secondary | ICD-10-CM

## 2013-11-26 DIAGNOSIS — R519 Headache, unspecified: Secondary | ICD-10-CM

## 2013-12-12 ENCOUNTER — Telehealth: Payer: Self-pay | Admitting: Neurology

## 2013-12-12 NOTE — Telephone Encounter (Signed)
Please call and notify the patient that the recent sleep study did not show any significant obstructive sleep apnea. Please inform patient that she can follow up routinely with Dr. Krista Blue or Cecille Rubin. Also, route or fax report to PCP and referring MD, if other than PCP.  Once you have spoken to patient, you can close this encounter.   Thanks,  Star Age, MD, PhD [facility])

## 2013-12-15 ENCOUNTER — Encounter: Payer: Self-pay | Admitting: *Deleted

## 2013-12-15 NOTE — Telephone Encounter (Signed)
I called and spoke with the patient about her recent sleep study results. I informed the patient that the study did not show any significant obstructive sleep apnea. Dr. Rexene Alberts recommend patient follow to up with Dr. Krista Blue or Cecille Rubin, NP. I will fax a copy of the report to Dr. Annye Asa and Cecille Rubin, NP offices and mail a copy to the patient.

## 2014-01-07 ENCOUNTER — Telehealth: Payer: Self-pay | Admitting: *Deleted

## 2014-01-07 NOTE — Telephone Encounter (Signed)
Pt notified that it is ok to receive zoster. Pt scheduled for Wednesday 5/27.

## 2014-01-07 NOTE — Telephone Encounter (Signed)
Ok to schedule shingles vaccine ASAP.  Should not have any sort of reaction

## 2014-01-07 NOTE — Telephone Encounter (Signed)
Caller name:  [place] Relation to pt:  self Call back number:  [phone] Pharmacy:  Reason for call:  Pt called and she has spoken with her insurance company and she is covered 100% for the shingles vaccine as long as she received it in her Dr office.  Pt wants to schedule appt.  Also, pt is having left hand surgery on 02/06/2014.  She wants to make sure if she receives vaccine before surgery, there will not be any type of reaction.  Please advise.

## 2014-01-14 ENCOUNTER — Ambulatory Visit (INDEPENDENT_AMBULATORY_CARE_PROVIDER_SITE_OTHER): Payer: BC Managed Care – PPO | Admitting: *Deleted

## 2014-01-14 DIAGNOSIS — Z23 Encounter for immunization: Secondary | ICD-10-CM

## 2014-01-14 DIAGNOSIS — Z2911 Encounter for prophylactic immunotherapy for respiratory syncytial virus (RSV): Secondary | ICD-10-CM

## 2014-02-02 ENCOUNTER — Ambulatory Visit (HOSPITAL_BASED_OUTPATIENT_CLINIC_OR_DEPARTMENT_OTHER)
Admission: RE | Admit: 2014-02-02 | Payer: BC Managed Care – PPO | Source: Ambulatory Visit | Admitting: Orthopedic Surgery

## 2014-02-02 ENCOUNTER — Encounter (HOSPITAL_BASED_OUTPATIENT_CLINIC_OR_DEPARTMENT_OTHER): Admission: RE | Payer: Self-pay | Source: Ambulatory Visit

## 2014-02-02 SURGERY — CARPOMETACARPEL (CMC) SUSPENSION PLASTY
Anesthesia: General | Site: Thumb | Laterality: Left

## 2014-02-24 ENCOUNTER — Other Ambulatory Visit: Payer: Self-pay | Admitting: Family Medicine

## 2014-02-24 NOTE — Telephone Encounter (Signed)
Med filled.  

## 2014-03-17 ENCOUNTER — Ambulatory Visit (INDEPENDENT_AMBULATORY_CARE_PROVIDER_SITE_OTHER): Payer: BC Managed Care – PPO | Admitting: Family Medicine

## 2014-03-17 ENCOUNTER — Encounter: Payer: Self-pay | Admitting: Family Medicine

## 2014-03-17 ENCOUNTER — Ambulatory Visit: Payer: BC Managed Care – PPO | Admitting: Family Medicine

## 2014-03-17 VITALS — BP 122/70 | HR 63 | Temp 98.0°F | Resp 16 | Wt 146.0 lb

## 2014-03-17 DIAGNOSIS — E785 Hyperlipidemia, unspecified: Secondary | ICD-10-CM

## 2014-03-17 LAB — HEPATIC FUNCTION PANEL
ALT: 24 U/L (ref 0–35)
AST: 18 U/L (ref 0–37)
Albumin: 4.3 g/dL (ref 3.5–5.2)
Alkaline Phosphatase: 52 U/L (ref 39–117)
Bilirubin, Direct: 0.1 mg/dL (ref 0.0–0.3)
Total Bilirubin: 0.7 mg/dL (ref 0.2–1.2)
Total Protein: 6.6 g/dL (ref 6.0–8.3)

## 2014-03-17 LAB — BASIC METABOLIC PANEL
BUN: 20 mg/dL (ref 6–23)
CO2: 27 mEq/L (ref 19–32)
Calcium: 9.6 mg/dL (ref 8.4–10.5)
Chloride: 107 mEq/L (ref 96–112)
Creatinine, Ser: 0.8 mg/dL (ref 0.4–1.2)
GFR: 76.73 mL/min (ref >60.00)
Glucose, Bld: 112 mg/dL — ABNORMAL HIGH (ref 70–99)
Potassium: 3.8 mEq/L (ref 3.5–5.1)
Sodium: 139 mEq/L (ref 135–145)

## 2014-03-17 LAB — LIPID PANEL
Cholesterol: 178 mg/dL (ref 0–200)
HDL: 52.4 mg/dL
LDL Cholesterol: 102 mg/dL — ABNORMAL HIGH (ref 0–99)
NonHDL: 125.6
Total CHOL/HDL Ratio: 3
Triglycerides: 119 mg/dL (ref 0.0–149.0)
VLDL: 23.8 mg/dL (ref 0.0–40.0)

## 2014-03-17 NOTE — Assessment & Plan Note (Signed)
Chronic problem.  Tolerating statin w/o difficulty.  Exercising regularly.  Check labs.  Adjust meds prn

## 2014-03-17 NOTE — Progress Notes (Signed)
Pre visit review using our clinic review tool, if applicable. No additional management support is needed unless otherwise documented below in the visit note. 

## 2014-03-17 NOTE — Patient Instructions (Signed)
Schedule your complete physical in 6 months We'll notify you of your lab results and make any changes if needed Keep up the good work!  You look great! Call with any questions or concerns Enjoy the rest of your summer!!! 

## 2014-03-17 NOTE — Progress Notes (Signed)
   Subjective:    Patient ID: Amy Perkins, female    DOB: [birth year], 63 y.o.   MRN: [id number]  HPI Hyperlipidemia- chronic problem, on Simvastatin daily.  Denies abd pain, N/V, myalgias.  No CP, SOB, HAs, visual changes, edema.   Review of Systems For ROS see HPI     Objective:   Physical Exam  Vitals reviewed. Constitutional: She is oriented to person, place, and time. She appears well-developed and well-nourished. No distress.  HENT:  Head: Normocephalic and atraumatic.  Eyes: Conjunctivae and EOM are normal. Pupils are equal, round, and reactive to light.  Neck: Normal range of motion. Neck supple. No thyromegaly present.  Cardiovascular: Normal rate, regular rhythm, normal heart sounds and intact distal pulses.   No murmur heard. Pulmonary/Chest: Effort normal and breath sounds normal. No respiratory distress.  Abdominal: Soft. She exhibits no distension. There is no tenderness.  Musculoskeletal: She exhibits no edema.  Lymphadenopathy:    She has no cervical adenopathy.  Neurological: She is alert and oriented to person, place, and time.  Skin: Skin is warm and dry.  Psychiatric: She has a normal mood and affect. Her behavior is normal.          Assessment & Plan:

## 2014-03-18 ENCOUNTER — Encounter: Payer: Self-pay | Admitting: General Practice

## 2014-04-28 ENCOUNTER — Encounter: Payer: Self-pay | Admitting: Neurology

## 2014-04-28 ENCOUNTER — Ambulatory Visit: Payer: BC Managed Care – PPO | Admitting: Nurse Practitioner

## 2014-04-28 ENCOUNTER — Ambulatory Visit (INDEPENDENT_AMBULATORY_CARE_PROVIDER_SITE_OTHER): Payer: BC Managed Care – PPO | Admitting: Neurology

## 2014-04-28 VITALS — BP 106/63 | HR 74 | Ht 64.0 in | Wt 138.0 lb

## 2014-04-28 DIAGNOSIS — G43001 Migraine without aura, not intractable, with status migrainosus: Secondary | ICD-10-CM

## 2014-04-28 MED ORDER — TOPIRAMATE 50 MG PO TABS: 50.0000 mg | ORAL_TABLET | Freq: Two times a day (BID) | ORAL | Status: DC

## 2014-04-28 MED ORDER — RIZATRIPTAN BENZOATE 5 MG PO TBDP: 5.0000 mg | ORAL_TABLET | ORAL | Status: DC | PRN

## 2014-04-28 NOTE — Progress Notes (Signed)
[facility]  PATIENT: Amy Perkins DOB: 11-30-1950   REASON FOR VISIT: for follow up migraines   HISTORY OF PRESENT ILLNESS: Amy Perkins, 63 year-old female returns for followup.   HISTORY: she has past medical history of migraine , anxiety and hyperlipidemia and depression, she is a retired Education officer, [employer]. Since January 2014, without clear trigger event, she began to have constant low grade 2 out of 10 throbbing headaches, started at left parietal region, also involve both parietal region, during last months, she also had 2 episodes of her typical migraine, blurry vision, followed by light sensitivity, noise sensitivity, mild nausea, low-grade headache, worsening by movement, relieved by resting, and take ibuprofen.   She denies jaw claudication, no chewing difficulty, no diffuse muscle achy pain   She had 3 episodes of migraine headache since last visit in September 27 2012, related to her sinus infection, relieved by ibuprofen, Tylenol in 30 minutes, while she was taking topiramate 25 mg 2 tablets twice a day, her headache has much improved, only intermittent mild left parietal area throbbing low-grade headaches, no significant side effect  She has a history of migraines that are in  good control with Topamax. She has not had to use for Maxalt very much. She has a new complaint today of daytime sleepiness. She snores at night but is not sure if she quits breathing or not. She has a recent injury to her left hand for which she received a steroid injection last month and continues to have pain over the left thumb area. She returns for reevaluation  UPDATE Sep 8th 2015: She is still taking Topamax  50mg  bid, she had 3-4 migraines each month, lasting 8 hours triggered by stress, her migraine always started at right occipital, spreading forward, she has tried extra strength Tylenol, which helped her mild to moderately, ibuprofen was no help. She also complains of excessive  stress at home   Her complains of excessive daytime sleepiness has resolved after tapering down to a lower dose of Xanax 0 point 5 mg, half tablet twice a day  REVIEW OF SYSTEMS: Full 14 system review of systems performed and notable only for those listed, all others are neg:  Joints swelling, depression, anxiety, daytime sleepiness   ALLERGIES: No Known Allergies  HOME MEDICATIONS: Outpatient Prescriptions Prior to Visit  Medication Sig Dispense Refill  . ALPRAZolam (XANAX) 0.5 MG tablet Take 0.5 mg by mouth 4 (four) times daily as needed.        Marland Kitchen aspirin 81 MG tablet Take 81 mg by mouth daily.        . Biotin 1000 MCG tablet Take 1,000 mcg by mouth daily.        . Calcium Carbonate (CALCIUM 600) 1500 MG TABS Take by mouth 2 (two) times daily.        . citalopram (CELEXA) 40 MG tablet Take 1 tablet (40 mg total) by mouth daily.  30 tablet  5  . Ibuprofen 200 MG CAPS Take by mouth 3 (three) times daily.       . Multiple Vitamin (MULTIVITAMIN) tablet Take 1 tablet by mouth daily.        Marland Kitchen oxyCODONE-acetaminophen (PERCOCET/ROXICET) 5-325 MG per tablet       . rizatriptan (MAXALT) 10 MG tablet Take 10 mg by mouth as needed for migraine. May repeat in 2 hours if needed      . simvastatin (ZOCOR) 40 MG tablet TAKE 1 TABLET (40 MG TOTAL) BY MOUTH AT  BEDTIME.  90 tablet  1  . topiramate (TOPAMAX) 50 MG tablet Take 1 tablet (50 mg total) by mouth 2 (two) times daily.  60 tablet  6   No facility-administered medications prior to visit.    PAST MEDICAL HISTORY: Past Medical History  Diagnosis Date  . Depression   . Hyperlipidemia   . Migraines     PAST SURGICAL HISTORY: Past Surgical History  Procedure Laterality Date  . Laser vein surgery    . Tonsillectomy    . Dilation and curettage of uterus      x2    FAMILY HISTORY: Family History  Problem Relation Age of Onset  . Coronary artery disease Father   . Stroke Father   . Parkinson's disease Father   . Hypertension Mother     . Diabetes Mother   . Stroke Mother   . Coronary artery disease Brother     SOCIAL HISTORY: History   Social History  . Marital Status: Divorced    Spouse Name: N/A    Number of Children: 2  . Years of Education: college   Occupational History  . Teacher     [employer]   Social History Main Topics  . Smoking status: Never Smoker   . Smokeless tobacco: Never Used  . Alcohol Use: 0.6 oz/week    1 Glasses of wine per week     Comment: wine daily -OCC  . Drug Use: No  . Sexual Activity: No   Other Topics Concern  . Not on file   Social History Narrative   Divorced, both children live locally.   Patient works part time at [employer]. College education.   Left handed.   Caffeine- two cups coffee daily.     PHYSICAL EXAM  Filed Vitals:   04/28/14 1412  Height: 5\' 4"  (1.626 m)  Weight: 138 lb (62.596 kg)   Body mass index is 23.68 kg/(m^2).  Generalized: Well developed, in no acute distress  Head: normocephalic and atraumatic,. Mallompatti 4  Neck: Supple, no carotid bruits neck 15.5 inches Cardiac: Regular rate rhythm, no murmur  Musculoskeletal: No deformity   Neurological examination   Mentation: Alert oriented to time, place, history taking. Follows all commands speech and language fluent. ESS 10  Cranial nerve II-XII: Pupils were equal round reactive to light extraocular movements were full, visual field were full on confrontational test. Facial sensation and strength were normal. hearing was intact to finger rubbing bilaterally. Uvula tongue midline. head turning and shoulder shrug were normal and symmetric.Tongue protrusion into cheek strength was normal. Motor: normal bulk and tone, full strength in the BUE, BLE, fine finger movements normal, no pronator drift. No focal weakness Sensory: normal and symmetric to light touch, pinprick, and  vibration  Coordination: finger-nose-finger, heel-to-shin bilaterally, no dysmetria Reflexes: Brachioradialis  2/2, biceps 2/2, triceps 2/2, patellar 2/2, Achilles 2/2, plantar responses were flexor bilaterally. Gait and Station: Rising up from seated position without assistance, normal stance,  moderate stride, good arm swing, smooth turning, able to perform tiptoe, and heel walking without difficulty. Tandem gait is steady  DIAGNOSTIC DATA (LABS, IMAGING, TESTING) - I reviewed patient records, labs, notes, testing and imaging myself where available.  Lab Results  Component Value Date   WBC 4.9 09/15/2013   HGB 14.5 09/15/2013   HCT 43.2 09/15/2013   MCV 90.8 09/15/2013   PLT 196.0 09/15/2013      Component Value Date/Time   NA 139 03/17/2014 0916   K 3.8 03/17/2014 0916  CL 107 03/17/2014 0916   CO2 27 03/17/2014 0916   GLUCOSE 112* 03/17/2014 0916   GLUCOSE 56 02/08/2010   BUN 20 03/17/2014 0916   CREATININE 0.8 03/17/2014 0916   CALCIUM 9.6 03/17/2014 0916   PROT 6.6 03/17/2014 0916   ALBUMIN 4.3 03/17/2014 0916   AST 18 03/17/2014 0916   ALT 24 03/17/2014 0916   ALKPHOS 52 03/17/2014 0916   BILITOT 0.7 03/17/2014 0916   GFRNONAA 90.72 02/15/2010 0849   Lab Results  Component Value Date   CHOL 178 03/17/2014   HDL 52.40 03/17/2014   LDLCALC 102* 03/17/2014   TRIG 119.0 03/17/2014   CHOLHDL 3 03/17/2014    Lab Results  Component Value Date   TSH 2.36 09/15/2013   ASSESSMENT AND PLAN  63 y.o. year old female  has a past medical history of Depression; Hyperlipidemia; and Migraines.    Continue Topamax 50 mg twice daily, I will also add on magnesium oxide 400 mg twice a day, riboflavin 100mg  twice a day Continue Maxalt 5 mg as needed Return to clinic in 6 months   [facility] [address] [place], [place] [address] [phone]

## 2014-04-28 NOTE — Patient Instructions (Signed)
Magnesium oxide 400 mg twice a day,  riboflavin 100 mg twice a day 

## 2014-05-27 ENCOUNTER — Other Ambulatory Visit: Payer: Self-pay | Admitting: Nurse Practitioner

## 2014-06-11 ENCOUNTER — Ambulatory Visit: Payer: BC Managed Care – PPO | Admitting: Family Medicine

## 2014-06-11 DIAGNOSIS — Z0289 Encounter for other administrative examinations: Secondary | ICD-10-CM

## 2014-06-12 ENCOUNTER — Encounter: Payer: Self-pay | Admitting: Family Medicine

## 2014-06-12 ENCOUNTER — Ambulatory Visit (INDEPENDENT_AMBULATORY_CARE_PROVIDER_SITE_OTHER): Payer: BC Managed Care – PPO | Admitting: Family Medicine

## 2014-06-12 VITALS — BP 130/80 | HR 74 | Temp 98.1°F | Resp 16 | Wt 153.2 lb

## 2014-06-12 DIAGNOSIS — R42 Dizziness and giddiness: Secondary | ICD-10-CM

## 2014-06-12 DIAGNOSIS — G43009 Migraine without aura, not intractable, without status migrainosus: Secondary | ICD-10-CM

## 2014-06-12 DIAGNOSIS — E162 Hypoglycemia, unspecified: Secondary | ICD-10-CM | POA: Insufficient documentation

## 2014-06-12 LAB — CBC WITH DIFFERENTIAL/PLATELET
Basophils Absolute: 0 10*3/uL (ref 0.0–0.1)
Basophils Relative: 0.6 % (ref 0.0–3.0)
Eosinophils Absolute: 0.1 10*3/uL (ref 0.0–0.7)
Eosinophils Relative: 1.2 % (ref 0.0–5.0)
HCT: 42.1 % (ref 36.0–46.0)
Hemoglobin: 14.1 g/dL (ref 12.0–15.0)
Lymphocytes Relative: 26.2 % (ref 12.0–46.0)
Lymphs Abs: 2 10*3/uL (ref 0.7–4.0)
MCHC: 33.4 g/dL (ref 30.0–36.0)
MCV: 90.8 fl (ref 78.0–100.0)
Monocytes Absolute: 0.4 10*3/uL (ref 0.1–1.0)
Monocytes Relative: 5.4 % (ref 3.0–12.0)
Neutro Abs: 5 10*3/uL (ref 1.4–7.7)
Neutrophils Relative %: 66.6 % (ref 43.0–77.0)
Platelets: 223 10*3/uL (ref 150.0–400.0)
RBC: 4.64 Mil/uL (ref 3.87–5.11)
RDW: 12.9 % (ref 11.5–15.5)
WBC: 7.6 10*3/uL (ref 4.0–10.5)

## 2014-06-12 LAB — HEMOGLOBIN A1C: Hgb A1c MFr Bld: 5.8 % (ref 4.6–6.5)

## 2014-06-12 LAB — BASIC METABOLIC PANEL
BUN: 23 mg/dL (ref 6–23)
CO2: 23 mEq/L (ref 19–32)
Calcium: 9.4 mg/dL (ref 8.4–10.5)
Chloride: 108 mEq/L (ref 96–112)
Creatinine, Ser: 0.8 mg/dL (ref 0.4–1.2)
GFR: 76.67 mL/min (ref >60.00)
Glucose, Bld: 98 mg/dL (ref 70–99)
Potassium: 3.6 mEq/L (ref 3.5–5.1)
Sodium: 138 mEq/L (ref 135–145)

## 2014-06-12 LAB — TSH: TSH: 3.19 u[IU]/mL (ref 0.35–4.50)

## 2014-06-12 NOTE — Patient Instructions (Signed)
Follow up in 2-3 weeks to recheck dizziness Increase your water intake to avoid dehydration Make sure you are eating regularly- 3 meals/day and a morning and afternoon snack.  You may also need a snack before bed if you are waking up shaky We'll notify you of your lab results and make any changes if needed Take your migraine medication as needed and if not improving, call Dr Krista Blue and follow up sooner Call with any questions or concerns Hang in there!!!

## 2014-06-12 NOTE — Progress Notes (Signed)
   Subjective:    Patient ID: Amy Perkins, female    DOB: 10-14-1950, 63 y.o.   MRN: [id number]  HPI Dizziness w/ exertion- pt reports 'periods of low blood sugar'.  Infrequent.  Can occur w/ exercise.  10/3 was on treadmill x25 min when she got 'real weak and dizzy'.  sxs improved w/ rest and gatorade.  Pt developed subsequent migraine.  Had another episode on 10/19- sxs improved w/ granola bar.  Again had subsequent migraine.  No CP, SOB, visual changes, N/V/D.   Review of Systems For ROS see HPI     Objective:   Physical Exam  Vitals reviewed. Constitutional: She is oriented to person, place, and time. She appears well-developed and well-nourished. No distress.  HENT:  Head: Normocephalic and atraumatic.  Mouth/Throat: Uvula is midline and mucous membranes are normal.  TMs WNL No TTP over sinuses Minimal nasal congestion  Eyes: Conjunctivae and EOM are normal. Pupils are equal, round, and reactive to light.  Neck: Normal range of motion. Neck supple.  Cardiovascular: Normal rate, regular rhythm, normal heart sounds and intact distal pulses.   Pulmonary/Chest: Effort normal and breath sounds normal. No respiratory distress. She has no wheezes. She has no rales.  Musculoskeletal: She exhibits no edema.  Lymphadenopathy:    She has no cervical adenopathy.  Neurological: She is alert and oriented to person, place, and time. She has normal reflexes. No cranial nerve deficit.  Skin: Skin is warm and dry.  Psychiatric: She has a normal mood and affect. Her behavior is normal. Judgment and thought content normal.          Assessment & Plan:

## 2014-06-12 NOTE — Progress Notes (Signed)
Pre visit review using our clinic review tool, if applicable. No additional management support is needed unless otherwise documented below in the visit note. 

## 2014-06-13 ENCOUNTER — Encounter: Payer: Self-pay | Admitting: General Practice

## 2014-06-14 NOTE — Assessment & Plan Note (Signed)
New.  Unclear if these episodes are truly hypoglycemia but they do seem to improve w/ eating/drinking.  Encouraged pt to eat regularly and add snacks to prevent low sugar.  Will follow.

## 2014-06-14 NOTE — Assessment & Plan Note (Signed)
New.  Occuring w/ exercise and precedes migraine.  Unclear if exercise precipitates dizziness or if dizziness is precursor/aura to migraine that is precipitated by exercise.  encouraged pt to eat regularly- including protein at each meal and snack to stabilize blood sugar.  Check labs to r/o diabetes or other abnormality.  Will follow.

## 2014-06-14 NOTE — Assessment & Plan Note (Signed)
Pt w/ hx of similar.  Encouraged her to take meds as prescribed and discussed that the dizziness she is experiencing may be an aura or precursor to migraine.  If sxs continue, pt is to f/u w/ neuro.

## 2014-06-22 ENCOUNTER — Encounter: Payer: Self-pay | Admitting: Family Medicine

## 2014-07-03 ENCOUNTER — Encounter: Payer: Self-pay | Admitting: Family Medicine

## 2014-07-03 ENCOUNTER — Ambulatory Visit (INDEPENDENT_AMBULATORY_CARE_PROVIDER_SITE_OTHER): Payer: BC Managed Care – PPO | Admitting: General Practice

## 2014-07-03 ENCOUNTER — Ambulatory Visit (INDEPENDENT_AMBULATORY_CARE_PROVIDER_SITE_OTHER): Payer: BC Managed Care – PPO | Admitting: Family Medicine

## 2014-07-03 VITALS — BP 110/78 | HR 64 | Temp 98.0°F | Resp 16 | Wt 154.1 lb

## 2014-07-03 DIAGNOSIS — R42 Dizziness and giddiness: Secondary | ICD-10-CM

## 2014-07-03 DIAGNOSIS — Z23 Encounter for immunization: Secondary | ICD-10-CM

## 2014-07-03 NOTE — Patient Instructions (Signed)
Follow up as scheduled for your physical Continue to eat regularly and take a snack w/ you to the gym Continue to drink plenty of fluids If your symptoms don't improve with snacks, please call me so we can move forward Call with any questions or concerns Hang in there! Happy Holidays!!!

## 2014-07-03 NOTE — Assessment & Plan Note (Signed)
Pt's sxs are consistent w/ exercise induced hypoglycemia.  Reviewed lifestyle modifications and need for regular protein based snacks.  Pt not interested in Neuro f/u.  Reviewed supportive care and red flags that should prompt return.  Pt expressed understanding and is in agreement w/ plan.

## 2014-07-03 NOTE — Progress Notes (Signed)
Pre visit review using our clinic review tool, if applicable. No additional management support is needed unless otherwise documented below in the visit note. 

## 2014-07-03 NOTE — Progress Notes (Signed)
   Subjective:    Patient ID: MANJU KULKARNI, female    DOB: Jan 18, 1951, 63 y.o.   MRN: [id number]  HPI Dizziness- pt reports she is now able to recognize the dizziness and 'do something about it'.  Pt reports she is able to exercise for 30 minutes and then sit down, rest, have a snack x10 minutes, and then able to finish exercise.  Pt reports eating regularly.  Also feels dizziness is stress related- having difficulty w/ son who has Asbergers.   Review of Systems For ROS see HPI     Objective:   Physical Exam  Constitutional: She is oriented to person, place, and time. She appears well-developed and well-nourished. No distress.  Cardiovascular: Normal rate, regular rhythm, normal heart sounds and intact distal pulses.   Pulmonary/Chest: Effort normal and breath sounds normal. No respiratory distress. She has no wheezes. She has no rales.  Neurological: She is alert and oriented to person, place, and time. She has normal reflexes. No cranial nerve deficit. Coordination normal.  Skin: Skin is warm and dry.  Psychiatric: She has a normal mood and affect. Her behavior is normal. Thought content normal.  Vitals reviewed.         Assessment & Plan:

## 2014-07-07 ENCOUNTER — Ambulatory Visit: Payer: BC Managed Care – PPO

## 2014-08-24 ENCOUNTER — Other Ambulatory Visit: Payer: Self-pay | Admitting: General Practice

## 2014-08-24 MED ORDER — SIMVASTATIN 40 MG PO TABS: ORAL_TABLET | ORAL | Status: DC

## 2014-09-08 ENCOUNTER — Other Ambulatory Visit: Payer: Self-pay | Admitting: Obstetrics and Gynecology

## 2014-09-08 LAB — HM MAMMOGRAPHY
HM Mammogram: NORMAL
HM Mammogram: NORMAL

## 2014-09-08 LAB — HM DEXA SCAN: HM Dexa Scan: NORMAL

## 2014-09-09 LAB — CYTOLOGY - PAP

## 2014-09-11 LAB — HM PAP SMEAR: HM Pap smear: NORMAL

## 2014-09-11 LAB — HM MAMMOGRAPHY: HM Mammogram: NORMAL

## 2014-09-17 ENCOUNTER — Telehealth: Payer: Self-pay | Admitting: *Deleted

## 2014-09-17 ENCOUNTER — Encounter: Payer: Self-pay | Admitting: *Deleted

## 2014-09-17 NOTE — Telephone Encounter (Signed)
Pre- Visit Call:    Reviewed allergies, health history, and health maintenance with patient.   Unable to review medications or pharmacy with patient because she had to end phone call for appointment.    Pap- states she had this last week with OBGYN- [facility] Colonoscopy- 02/18/2010- no image found, reports that it was normal and she was told to return in 10 yrs. Mmg- 04/15/03  BI- RADS 1- Neg, no results found since (done through Dr. Helane Rima) BD- reports that she has an apt through Dr. Helane Rima to have this done this year Flu- 07/03/14- UTD Td- 06/28/11- UTD Pna- NA  Zoster- 01/14/14 - UTD  No questions or concerns for MD at this time.  eal

## 2014-09-18 ENCOUNTER — Encounter: Payer: Self-pay | Admitting: Family Medicine

## 2014-09-18 ENCOUNTER — Other Ambulatory Visit: Payer: Self-pay | Admitting: General Practice

## 2014-09-18 ENCOUNTER — Telehealth: Payer: Self-pay | Admitting: Family Medicine

## 2014-09-18 ENCOUNTER — Ambulatory Visit (INDEPENDENT_AMBULATORY_CARE_PROVIDER_SITE_OTHER): Payer: BC Managed Care – PPO | Admitting: Family Medicine

## 2014-09-18 VITALS — BP 124/80 | HR 58 | Temp 98.0°F | Resp 16 | Ht 64.0 in | Wt 153.5 lb

## 2014-09-18 DIAGNOSIS — R7989 Other specified abnormal findings of blood chemistry: Secondary | ICD-10-CM

## 2014-09-18 DIAGNOSIS — Z0000 Encounter for general adult medical examination without abnormal findings: Secondary | ICD-10-CM

## 2014-09-18 LAB — LIPID PANEL
Cholesterol: 170 mg/dL (ref 0–200)
HDL: 53.2 mg/dL (ref >39.00)
LDL Cholesterol: 91 mg/dL (ref 0–99)
NonHDL: 116.8
Total CHOL/HDL Ratio: 3
Triglycerides: 131 mg/dL (ref 0.0–149.0)
VLDL: 26.2 mg/dL (ref 0.0–40.0)

## 2014-09-18 LAB — BASIC METABOLIC PANEL
BUN: 20 mg/dL (ref 6–23)
CO2: 26 mEq/L (ref 19–32)
Calcium: 9.8 mg/dL (ref 8.4–10.5)
Chloride: 108 mEq/L (ref 96–112)
Creatinine, Ser: 0.91 mg/dL (ref 0.40–1.20)
GFR: 66.02 mL/min (ref >60.00)
Glucose, Bld: 113 mg/dL — ABNORMAL HIGH (ref 70–99)
Potassium: 4 mEq/L (ref 3.5–5.1)
Sodium: 141 mEq/L (ref 135–145)

## 2014-09-18 LAB — CBC WITH DIFFERENTIAL/PLATELET
Basophils Absolute: 0 10*3/uL (ref 0.0–0.1)
Basophils Relative: 0.6 % (ref 0.0–3.0)
Eosinophils Absolute: 0.2 10*3/uL (ref 0.0–0.7)
Eosinophils Relative: 2.3 % (ref 0.0–5.0)
HCT: 43.7 % (ref 36.0–46.0)
Hemoglobin: 15 g/dL (ref 12.0–15.0)
Lymphocytes Relative: 29.5 % (ref 12.0–46.0)
Lymphs Abs: 2 10*3/uL (ref 0.7–4.0)
MCHC: 34.3 g/dL (ref 30.0–36.0)
MCV: 88.6 fl (ref 78.0–100.0)
Monocytes Absolute: 0.5 10*3/uL (ref 0.1–1.0)
Monocytes Relative: 7 % (ref 3.0–12.0)
Neutro Abs: 4.1 10*3/uL (ref 1.4–7.7)
Neutrophils Relative %: 60.6 % (ref 43.0–77.0)
Platelets: 218 10*3/uL (ref 150.0–400.0)
RBC: 4.93 Mil/uL (ref 3.87–5.11)
RDW: 13.4 % (ref 11.5–15.5)
WBC: 6.7 10*3/uL (ref 4.0–10.5)

## 2014-09-18 LAB — HEPATIC FUNCTION PANEL
ALT: 21 U/L (ref 0–35)
AST: 17 U/L (ref 0–37)
Albumin: 4.5 g/dL (ref 3.5–5.2)
Alkaline Phosphatase: 62 U/L (ref 39–117)
Bilirubin, Direct: 0.1 mg/dL (ref 0.0–0.3)
Total Bilirubin: 0.4 mg/dL (ref 0.2–1.2)
Total Protein: 6.6 g/dL (ref 6.0–8.3)

## 2014-09-18 LAB — VITAMIN D 25 HYDROXY (VIT D DEFICIENCY, FRACTURES): VITD: 65.85 ng/mL (ref 30.00–100.00)

## 2014-09-18 LAB — TSH: TSH: 6.42 u[IU]/mL — ABNORMAL HIGH (ref 0.35–4.50)

## 2014-09-18 MED ORDER — LEVOTHYROXINE SODIUM 50 MCG PO TABS: 50.0000 ug | ORAL_TABLET | Freq: Every day | ORAL | Status: DC

## 2014-09-18 NOTE — Progress Notes (Signed)
   Subjective:    Patient ID: Amy Perkins, female    DOB: 1951-04-02, 64 y.o.   MRN: [id number]  HPI CPE- UTD on pap, mammo ([facility]), colonoscopy.     Review of Systems Patient reports no vision/ hearing changes, adenopathy,fever, weight change,  persistant/recurrent hoarseness , swallowing issues, chest pain, palpitations, edema, persistant/recurrent cough, hemoptysis, dyspnea (rest/exertional/paroxysmal nocturnal), gastrointestinal bleeding (melena, rectal bleeding), abdominal pain, significant heartburn, bowel changes, GU symptoms (dysuria, hematuria, incontinence), Gyn symptoms (abnormal  bleeding, pain),  syncope, focal weakness, memory loss, numbness & tingling, skin/hair/nail changes, abnormal bruising or bleeding, anxiety, or depression.     Objective:   Physical Exam General Appearance:    Alert, cooperative, no distress, appears stated age  Head:    Normocephalic, without obvious abnormality, atraumatic  Eyes:    PERRL, conjunctiva/corneas clear, EOM's intact, fundi    benign, both eyes  Ears:    Normal TM's and external ear canals, both ears  Nose:   Nares normal, septum midline, mucosa normal, no drainage    or sinus tenderness  Throat:   Lips, mucosa, and tongue normal; teeth and gums normal  Neck:   Supple, symmetrical, trachea midline, no adenopathy;    Thyroid: no enlargement/tenderness/nodules  Back:     Symmetric, no curvature, ROM normal, no CVA tenderness  Lungs:     Clear to auscultation bilaterally, respirations unlabored  Chest Wall:    No tenderness or deformity   Heart:    Regular rate and rhythm, S1 and S2 normal, no murmur, rub   or gallop  Breast Exam:    Deferred to GYN  Abdomen:     Soft, non-tender, bowel sounds active all four quadrants,    no masses, no organomegaly  Genitalia:    Deferred to GYN  Rectal:    Extremities:   Extremities normal, atraumatic, no cyanosis or edema  Pulses:   2+ and symmetric all extremities  Skin:   Skin color,  texture, turgor normal, no rashes or lesions  Lymph nodes:   Cervical, supraclavicular, and axillary nodes normal  Neurologic:   CNII-XII intact, normal strength, sensation and reflexes    throughout          Assessment & Plan:

## 2014-09-18 NOTE — Assessment & Plan Note (Signed)
Pt's PE WNL.  UTD on GYN, colonoscopy.  Check labs.  Anticipatory guidance provided.  

## 2014-09-18 NOTE — Patient Instructions (Signed)
Follow up in 6 months to recheck cholesterol We'll notify you of your lab results and make any changes if needed Keep up the good work on healthy diet and regular exercise 3x/week saturate a cotton ball w/ peroxide and drip into each ear to liquify the wax Call with any questions or concerns Happy New Year!

## 2014-09-18 NOTE — Progress Notes (Signed)
Pre visit review using our clinic review tool, if applicable. No additional management support is needed unless otherwise documented below in the visit note. 

## 2014-09-18 NOTE — Telephone Encounter (Signed)
emmi emailed °

## 2014-09-25 ENCOUNTER — Telehealth: Payer: Self-pay | Admitting: Neurology

## 2014-09-25 NOTE — Telephone Encounter (Signed)
We have contacted ins asking for a quantity limit exception.  Request is currently pending.  I called the patient back, got no answer.  Left message.

## 2014-09-25 NOTE — Telephone Encounter (Signed)
Patient stated prior authorization is needed for Rx rizatriptan (MAXALT-MLT) 5 MG disintegrating tablet due to new guidelines with [employer], only 3 tablets would be filled.  Additional amounts will need prior authorization.  Please call and advise.

## 2014-09-28 NOTE — Telephone Encounter (Signed)
Request has been approved effective until 08/15/2015 Case ID [id number]

## 2014-10-20 ENCOUNTER — Other Ambulatory Visit (INDEPENDENT_AMBULATORY_CARE_PROVIDER_SITE_OTHER): Payer: BC Managed Care – PPO

## 2014-10-20 DIAGNOSIS — R7989 Other specified abnormal findings of blood chemistry: Secondary | ICD-10-CM

## 2014-10-20 LAB — TSH: TSH: 1.37 u[IU]/mL (ref 0.35–4.50)

## 2014-10-21 ENCOUNTER — Encounter: Payer: Self-pay | Admitting: General Practice

## 2014-10-27 ENCOUNTER — Encounter: Payer: Self-pay | Admitting: Neurology

## 2014-10-27 ENCOUNTER — Other Ambulatory Visit: Payer: Self-pay | Admitting: Neurology

## 2014-10-27 ENCOUNTER — Ambulatory Visit (INDEPENDENT_AMBULATORY_CARE_PROVIDER_SITE_OTHER): Payer: BC Managed Care – PPO | Admitting: Neurology

## 2014-10-27 ENCOUNTER — Telehealth: Payer: Self-pay | Admitting: Neurology

## 2014-10-27 VITALS — BP 105/66 | HR 58 | Ht 64.0 in | Wt 155.0 lb

## 2014-10-27 DIAGNOSIS — G43001 Migraine without aura, not intractable, with status migrainosus: Secondary | ICD-10-CM | POA: Insufficient documentation

## 2014-10-27 MED ORDER — RIZATRIPTAN BENZOATE 5 MG PO TBDP: 5.0000 mg | ORAL_TABLET | ORAL | Status: DC | PRN

## 2014-10-27 MED ORDER — TOPIRAMATE 100 MG PO TABS: 100.0000 mg | ORAL_TABLET | Freq: Two times a day (BID) | ORAL | Status: DC

## 2014-10-27 MED ORDER — TOPIRAMATE 100 MG PO TABS: 50.0000 mg | ORAL_TABLET | Freq: Two times a day (BID) | ORAL | Status: DC

## 2014-10-27 NOTE — Telephone Encounter (Signed)
I called the patient back.  Got no answer.  Left message.  Refill has been sent.   Prior Auth request was approved effective until 08/15/2015 Ref # [id number]

## 2014-10-27 NOTE — Progress Notes (Signed)
[facility]  PATIENT: Amy Perkins DOB: [birth year]   REASON FOR VISIT: for follow up migraines   HISTORY OF PRESENT ILLNESS: Amy Perkins, 64 year-old female returns for followup.   HISTORY: she has past medical history of migraine , anxiety and hyperlipidemia and depression, she is a retired Education officer, [employer]. Since January 2014, without clear trigger event, she began to have constant low grade 2 out of 10 throbbing headaches, started at left parietal region, also involve both parietal region, during last months, she also had 2 episodes of her typical migraine, blurry vision, followed by light sensitivity, noise sensitivity, mild nausea, low-grade headache, worsening by movement, relieved by resting, and take ibuprofen.   She denies jaw claudication, no chewing difficulty, no diffuse muscle achy pain   She had 3 episodes of migraine headache since last visit in September 27 2012, related to her sinus infection, relieved by ibuprofen, Tylenol in 30 minutes, while she was taking topiramate 25 mg 2 tablets twice a day, her headache has much improved, only intermittent mild left parietal area throbbing low-grade headaches, no significant side effect  She has a history of migraines that are in  good control with Topamax. She has not had to use for Maxalt very much. She has a new complaint today of daytime sleepiness. She snores at night but is not sure if she quits breathing or not. She has a recent injury to her left hand for which she received a steroid injection last month and continues to have pain over the left thumb area. She returns for reevaluation  UPDATE Sep 8th 2015: She is still taking Topamax  50mg  bid, she had 3-4 migraines each month, lasting 8 hours triggered by stress, her migraine always started at right occipital, spreading forward, she has tried extra strength Tylenol, which helped her mild to moderately, ibuprofen was no help. She also complains of excessive  stress at home   Her complains of excessive daytime sleepiness has resolved after tapering down to a lower dose of Xanax 0.5 mg, half tablet twice a day  UPDATE March 8th 2016: She still has 3-4 headaches each week, her headaches are milder, she is still taking Topamax 50 mg twice a day, riboflavin 100 mg twice a day, magnesium oxide 400 mg twice a day   Maxalt as needed was helpful,  REVIEW OF SYSTEMS: Full 14 system review of systems performed and notable only for those listed, all others are neg:  Chills, cold intolerance, dizziness, headaches, depression, anxiety   ALLERGIES: No Known Allergies  HOME MEDICATIONS: Outpatient Prescriptions Prior to Visit  Medication Sig Dispense Refill  . ALPRAZolam (XANAX) 0.5 MG tablet Take 1/2 tablet by mouth 2 times daily , and 1 tablet at bedtime.    Marland Kitchen aspirin 81 MG tablet Take 81 mg by mouth daily.      . Biotin 1000 MCG tablet Take 1,000 mcg by mouth daily.      . Calcium Carbonate (CALCIUM 600) 1500 MG TABS Take by mouth 2 (two) times daily.      . citalopram (CELEXA) 40 MG tablet Take 1 tablet (40 mg total) by mouth daily. 30 tablet 5  . Glucosamine-Chondroit-Vit C-Mn (GLUCOSAMINE CHONDR 1500 COMPLX PO) Take 1,500 mg by mouth daily.    . Ibuprofen 200 MG CAPS Take by mouth 3 (three) times daily.     Marland Kitchen levothyroxine (SYNTHROID, LEVOTHROID) 50 MCG tablet Take 1 tablet (50 mcg total) by mouth daily. 90 tablet 1  . magnesium  oxide (MAG-OX) 400 MG tablet Take 400 mg by mouth daily.    . Multiple Vitamin (MULTIVITAMIN) tablet Take 1 tablet by mouth daily.      . riboflavin (VITAMIN B-2) 100 MG TABS tablet Take 100 mg by mouth 2 (two) times daily.    . rizatriptan (MAXALT-MLT) 5 MG disintegrating tablet Take 1 tablet (5 mg total) by mouth as needed. May repeat in 2 hours if needed 15 tablet 6  . simvastatin (ZOCOR) 40 MG tablet TAKE 1 TABLET (40 MG TOTAL) BY MOUTH AT BEDTIME. 90 tablet 0  . topiramate (TOPAMAX) 50 MG tablet Take 1 tablet (50 mg  total) by mouth 2 (two) times daily. 60 tablet 11   No facility-administered medications prior to visit.    PAST MEDICAL HISTORY: Past Medical History  Diagnosis Date  . Depression   . Hyperlipidemia   . Migraines     PAST SURGICAL HISTORY: Past Surgical History  Procedure Laterality Date  . Laser vein surgery    . Tonsillectomy    . Dilation and curettage of uterus      x2    FAMILY HISTORY: Family History  Problem Relation Age of Onset  . Coronary artery disease Father   . Stroke Father   . Parkinson's disease Father   . Hypertension Mother   . Diabetes Mother   . Stroke Mother   . Coronary artery disease Brother     SOCIAL HISTORY: History   Social History  . Marital Status: Divorced    Spouse Name: N/A  . Number of Children: 2  . Years of Education: college   Occupational History  . Teacher     [employer]   Social History Main Topics  . Smoking status: Never Smoker   . Smokeless tobacco: Never Used  . Alcohol Use: 0.6 oz/week    1 Glasses of wine per week     Comment: wine daily -OCC  . Drug Use: No  . Sexual Activity: No   Other Topics Concern  . Not on file   Social History Narrative   Divorced, both children live locally.   Patient works part time at [employer]. College education.   Left handed.   Caffeine- two cups coffee daily.     PHYSICAL EXAM  Filed Vitals:   10/27/14 1201  BP: 105/66  Pulse: 58  Height: 5\' 4"  (1.626 m)  Weight: 155 lb (70.308 kg)   Body mass index is 26.59 kg/(m^2).  PHYSICAL EXAMNIATION:  Gen: NAD, conversant, well nourised, obese, well groomed                     Cardiovascular: Regular rate rhythm, no peripheral edema, warm, nontender. Eyes: Conjunctivae clear without exudates or hemorrhage Neck: Supple, no carotid bruise. Pulmonary: Clear to auscultation bilaterally   NEUROLOGICAL EXAM:  MENTAL STATUS: Speech:    Speech is normal; fluent and spontaneous with normal comprehension.    Cognition:    The patient is oriented to person, place, and time;     recent and remote memory intact;     language fluent;     normal attention, concentration,     fund of knowledge.  CRANIAL NERVES: CN II: Visual fields are full to confrontation. Fundoscopic exam is normal with sharp discs and no vascular changes. Venous pulsations are present bilaterally. Pupils are 4 mm and briskly reactive to light. Visual acuity is 20/20 bilaterally. CN III, IV, VI: extraocular movement are normal. No ptosis. CN V: Facial  sensation is intact to pinprick in all 3 divisions bilaterally. Corneal responses are intact.  CN VII: Face is symmetric with normal eye closure and smile. CN VIII: Hearing is normal to rubbing fingers CN IX, X: Palate elevates symmetrically. Phonation is normal. CN XI: Head turning and shoulder shrug are intact CN XII: Tongue is midline with normal movements and no atrophy.  MOTOR: There is no pronator drift of out-stretched arms. Muscle bulk and tone are normal. Muscle strength is normal.   Shoulder abduction Shoulder external rotation Elbow flexion Elbow extension Wrist flexion Wrist extension Finger abduction Hip flexion Knee flexion Knee extension Ankle dorsi flexion Ankle plantar flexion  R 5 5 5 5 5 5 5 5 5 5 5 5   L 5 5 5 5 5 5 5 5 5 5 5 5     REFLEXES: Reflexes are 2+ and symmetric at the biceps, triceps, knees, and ankles. Plantar responses are flexor.  SENSORY: Light touch, pinprick, position sense, and vibration sense are intact in fingers and toes.  COORDINATION: Rapid alternating movements and fine finger movements are intact. There is no dysmetria on finger-to-nose and heel-knee-shin. There are no abnormal or extraneous movements.   GAIT/STANCE: Posture is normal. Gait is steady with normal steps, base, arm swing, and turning. Heel and toe walking are normal. Tandem gait is normal.  Romberg is absent.    DIAGNOSTIC DATA (LABS, IMAGING, TESTING) - I  reviewed patient records, labs, notes, testing and imaging myself where available.  Lab Results  Component Value Date   WBC 6.7 09/18/2014   HGB 15.0 09/18/2014   HCT 43.7 09/18/2014   MCV 88.6 09/18/2014   PLT 218.0 09/18/2014      Component Value Date/Time   NA 141 09/18/2014 0848   K 4.0 09/18/2014 0848   CL 108 09/18/2014 0848   CO2 26 09/18/2014 0848   GLUCOSE 113* 09/18/2014 0848   GLUCOSE 56 02/08/2010   BUN 20 09/18/2014 0848   CREATININE 0.91 09/18/2014 0848   CALCIUM 9.8 09/18/2014 0848   PROT 6.6 09/18/2014 0848   ALBUMIN 4.5 09/18/2014 0848   AST 17 09/18/2014 0848   ALT 21 09/18/2014 0848   ALKPHOS 62 09/18/2014 0848   BILITOT 0.4 09/18/2014 0848   GFRNONAA 90.72 02/15/2010 0849   Lab Results  Component Value Date   CHOL 170 09/18/2014   HDL 53.20 09/18/2014   LDLCALC 91 09/18/2014   TRIG 131.0 09/18/2014   CHOLHDL 3 09/18/2014    Lab Results  Component Value Date   TSH 1.37 10/20/2014   ASSESSMENT AND PLAN  64 y.o. year old female  has a past medical history of Migraines, frequent occurrence,   I will increase Topamax to 100 mg twice a day, add on magnesium oxide 400 mg twice a day, riboflavin 100mg  twice a day Continue Maxalt 5 mg as needed Return to clinic in 3 months with Rhae Hammock, M.D. Ph.D. [facility] [address], [place], [place] [address] [phone]

## 2014-10-27 NOTE — Telephone Encounter (Signed)
Patient is calling in regard to pre approval for the Rx Rizatriptan 5 mg for insurance company. [facility] is using [facility] on [address] [place]. Please call patient.

## 2014-10-29 ENCOUNTER — Telehealth: Payer: Self-pay | Admitting: Neurology

## 2014-10-29 NOTE — Telephone Encounter (Signed)
Patient is calling because insurance company needs authorization for Rizatriptan 5 mg as they say they never received from doctor. Patient cannot get Rx filled at present without this.  Please call.Thanks!

## 2014-10-29 NOTE — Telephone Encounter (Signed)
Please see previous note.  Authorization was sent, and already approved.  I called the pharmacy.  The pharmacist processed the Rx and said it is covered, but showing up as a refill too soon until 04/07.  I called the ins.  Spoke with Wallace.  We were on the phone for nearly 30 minutes.  She does see an Josem Kaufmann is on file for 15 per 30 days, and says it should not be a refill too soon.  She asked me to call the pharmacy back again, which I did.  Spoke with pharmacist, and they reprocessed the claim, which paid without any issues.  I called the patient back.  Got no answer.  Left message.

## 2014-11-20 ENCOUNTER — Other Ambulatory Visit: Payer: Self-pay | Admitting: Family Medicine

## 2014-11-20 NOTE — Telephone Encounter (Signed)
Med filled.  

## 2015-01-27 ENCOUNTER — Encounter: Payer: Self-pay | Admitting: Nurse Practitioner

## 2015-01-27 ENCOUNTER — Ambulatory Visit (INDEPENDENT_AMBULATORY_CARE_PROVIDER_SITE_OTHER): Payer: Medicare Other | Admitting: Nurse Practitioner

## 2015-01-27 VITALS — BP 109/65 | HR 66 | Ht 64.0 in | Wt 152.0 lb

## 2015-01-27 DIAGNOSIS — G43001 Migraine without aura, not intractable, with status migrainosus: Secondary | ICD-10-CM

## 2015-01-27 DIAGNOSIS — G43009 Migraine without aura, not intractable, without status migrainosus: Secondary | ICD-10-CM | POA: Diagnosis not present

## 2015-01-27 MED ORDER — TOPIRAMATE 50 MG PO TABS: ORAL_TABLET | ORAL | Status: DC

## 2015-01-27 NOTE — Progress Notes (Signed)
[facility]  PATIENT: Amy Perkins DOB: 19-Apr-1951   REASON FOR VISIT: Follow-up for migraines HISTORY FROM: Patient    HISTORY OF PRESENT ILLNESS:HISTORY: she has past medical history of migraine , anxiety and hyperlipidemia and depression, she is a retired Education officer, [employer]. Since January 2014, without clear trigger event, she began to have constant low grade 2 out of 10 throbbing headaches, started at left parietal region, also involve both parietal region, during last months, she also had 2 episodes of her typical migraine, blurry vision, followed by light sensitivity, noise sensitivity, mild nausea, low-grade headache, worsening by movement, relieved by resting, and take ibuprofen.   She denies jaw claudication, no chewing difficulty, no diffuse muscle achy pain   She had 3 episodes of migraine headache since last visit in September 27 2012, related to her sinus infection, relieved by ibuprofen, Tylenol in 30 minutes, while she was taking topiramate 25 mg 2 tablets twice a day, her headache has much improved, only intermittent mild left parietal area throbbing low-grade headaches, no significant side effect  She has a history of migraines that are in good control with Topamax. She has not had to use for Maxalt very much. She has a new complaint today of daytime sleepiness. She snores at night but is not sure if she quits breathing or not. She has a recent injury to her left hand for which she received a steroid injection last month and continues to have pain over the left thumb area. She returns for reevaluation  UPDATE Sep 8th 2015: She is still taking Topamax 50mg  bid, she had 3-4 migraines each month, lasting 8 hours triggered by stress, her migraine always started at right occipital, spreading forward, she has tried extra strength Tylenol, which helped her mild to moderately, ibuprofen was no help. She also complains of excessive stress at home   Her complains  of excessive daytime sleepiness has resolved after tapering down to a lower dose of Xanax 0.5 mg, half tablet twice a day  UPDATE March 8th 2016: She still has 3-4 headaches each week, her headaches are milder, she is still taking Topamax 50 mg twice a day, riboflavin 100 mg twice a day, magnesium oxide 400 mg twice a day  Maxalt as needed was helpful, UPDATE 01/27/15 Ms. Teagarden, 64 year old female returns for follow-up. She was last seen in the office by Dr. Krista Blue 10/27/2014. She has a history of migraines and her Topamax was increased to 100 mg twice daily at her last visit and she claims this caused more headaches she cut back to a total dose of 150 daily and her headaches are in better control. She takes Maxalt acutely she also takes magnesium oxide and riboflavin which has been beneficial. She returns for reevaluation  REVIEW OF SYSTEMS: Full 14 system review of systems performed and notable only for those listed, all others are neg:  Constitutional: neg  Cardiovascular: neg Ear/Nose/Throat: neg  Skin: neg Eyes: neg Respiratory: neg Gastroitestinal: neg  Hematology/Lymphatic: neg  Endocrine: neg Musculoskeletal:neg Allergy/Immunology: neg Neurological: Headache Psychiatric: Anxiety depression Sleep : neg   ALLERGIES: No Known Allergies  HOME MEDICATIONS: Outpatient Prescriptions Prior to Visit  Medication Sig Dispense Refill  . ALPRAZolam (XANAX) 0.5 MG tablet Take 1/2 tablet by mouth 2 times daily , and 1 tablet at bedtime.    Marland Kitchen aspirin 81 MG tablet Take 81 mg by mouth daily.      . Biotin 1000 MCG tablet Take 1,000 mcg by mouth daily.      Marland Kitchen  Calcium Carbonate (CALCIUM 600) 1500 MG TABS Take by mouth 2 (two) times daily.      . citalopram (CELEXA) 40 MG tablet Take 1 tablet (40 mg total) by mouth daily. 30 tablet 5  . Glucosamine-Chondroit-Vit C-Mn (GLUCOSAMINE CHONDR 1500 COMPLX PO) Take 1,500 mg by mouth daily.    . Ibuprofen 200 MG CAPS Take by mouth 3 (three) times  daily.     Marland Kitchen levothyroxine (SYNTHROID, LEVOTHROID) 50 MCG tablet Take 1 tablet (50 mcg total) by mouth daily. 90 tablet 1  . magnesium oxide (MAG-OX) 400 MG tablet Take 400 mg by mouth daily.    . Multiple Vitamin (MULTIVITAMIN) tablet Take 1 tablet by mouth daily.      . riboflavin (VITAMIN B-2) 100 MG TABS tablet Take 100 mg by mouth 2 (two) times daily.    . rizatriptan (MAXALT-MLT) 5 MG disintegrating tablet Take 1 tablet (5 mg total) by mouth as needed. May repeat in 2 hours if needed 15 tablet 6  . simvastatin (ZOCOR) 40 MG tablet TAKE 1 TABLET (40 MG TOTAL) BY MOUTH AT BEDTIME. 90 tablet 1  . topiramate (TOPAMAX) 100 MG tablet Take 1 tablet (100 mg total) by mouth 2 (two) times daily. (Patient not taking: Reported on 01/27/2015) 60 tablet 11   No facility-administered medications prior to visit.    PAST MEDICAL HISTORY: Past Medical History  Diagnosis Date  . Depression   . Hyperlipidemia   . Migraines     PAST SURGICAL HISTORY: Past Surgical History  Procedure Laterality Date  . Laser vein surgery    . Tonsillectomy    . Dilation and curettage of uterus      x2    FAMILY HISTORY: Family History  Problem Relation Age of Onset  . Coronary artery disease Father   . Stroke Father   . Parkinson's disease Father   . Hypertension Mother   . Diabetes Mother   . Stroke Mother   . Coronary artery disease Brother     SOCIAL HISTORY: History   Social History  . Marital Status: Divorced    Spouse Name: N/A  . Number of Children: 2  . Years of Education: college   Occupational History  . Teacher     [employer]   Social History Main Topics  . Smoking status: Never Smoker   . Smokeless tobacco: Never Used  . Alcohol Use: 0.6 oz/week    1 Glasses of wine per week     Comment: wine daily -OCC  . Drug Use: No  . Sexual Activity: No   Other Topics Concern  . Not on file   Social History Narrative   Divorced, both children live locally.   Patient works part time at  [employer]. College education.   Left handed.   Caffeine- two cups coffee daily.     PHYSICAL EXAM  Filed Vitals:   01/27/15 0946  BP: 109/65  Pulse: 66  Height: 5\' 4"  (1.626 m)  Weight: 152 lb (68.947 kg)   Body mass index is 26.08 kg/(m^2).  Generalized: Well developed, in no acute distress  Head: normocephalic and atraumatic,. Oropharynx benign  Neck: Supple, no carotid bruits  Musculoskeletal: No deformity   Neurological examination   Mentation: Alert oriented to time, place, history taking. Attention span and concentration appropriate. Recent and remote memory intact.  Follows all commands speech and language fluent.   Cranial nerve II-XII: Pupils were equal round reactive to light extraocular movements were full, visual field were full  on confrontational test. Facial sensation and strength were normal. hearing was intact to finger rubbing bilaterally. Uvula tongue midline. head turning and shoulder shrug were normal and symmetric.Tongue protrusion into cheek strength was normal. Motor: normal bulk and tone, full strength in the BUE, BLE, fine finger movements normal, no pronator drift. No focal weakness Sensory: normal and symmetric to light touch, pinprick, and  Vibration, proprioception  Coordination: finger-nose-finger, heel-to-shin bilaterally, no dysmetria Reflexes: Brachioradialis 2/2, biceps 2/2, triceps 2/2, patellar 2/2, Achilles 2/2, plantar responses were flexor bilaterally. Gait and Station: Rising up from seated position without assistance, normal stance,  moderate stride, good arm swing, smooth turning, able to perform tiptoe, and heel walking without difficulty. Tandem gait is steady  DIAGNOSTIC DATA (LABS, IMAGING, TESTING) - I reviewed patient records, labs, notes, testing and imaging myself where available.  Lab Results  Component Value Date   WBC 6.7 09/18/2014   HGB 15.0 09/18/2014   HCT 43.7 09/18/2014   MCV 88.6 09/18/2014   PLT 218.0  09/18/2014      Component Value Date/Time   NA 141 09/18/2014 0848   K 4.0 09/18/2014 0848   CL 108 09/18/2014 0848   CO2 26 09/18/2014 0848   GLUCOSE 113* 09/18/2014 0848   GLUCOSE 56 02/08/2010   BUN 20 09/18/2014 0848   CREATININE 0.91 09/18/2014 0848   CALCIUM 9.8 09/18/2014 0848   PROT 6.6 09/18/2014 0848   ALBUMIN 4.5 09/18/2014 0848   AST 17 09/18/2014 0848   ALT 21 09/18/2014 0848   ALKPHOS 62 09/18/2014 0848   BILITOT 0.4 09/18/2014 0848   GFRNONAA 90.72 02/15/2010 0849   Lab Results  Component Value Date   CHOL 170 09/18/2014   HDL 53.20 09/18/2014   LDLCALC 91 09/18/2014   TRIG 131.0 09/18/2014   CHOLHDL 3 09/18/2014    Lab Results  Component Value Date   TSH 1.37 10/20/2014      ASSESSMENT AND PLAN  64 y.o. year old female  has a past medical history of Depression; Hyperlipidemia; and Migraines. here to follow-up.  Continue Topamax 50 mg in the morning 100 at night will refill Continue Maxalt when necessary acutely Follow-up in 6 months Dennie Bible, [facility], [facility], APRN  [facility] [address] [place], [place] [address] [phone]

## 2015-01-27 NOTE — Patient Instructions (Signed)
Continue Topamax 50 mg in the morning 100 at night will refill Continue Maxalt when necessary acute Follow-up in 6 months

## 2015-01-29 NOTE — Progress Notes (Signed)
I have reviewed and agreed above plan. 

## 2015-03-01 ENCOUNTER — Encounter: Payer: Self-pay | Admitting: Medical

## 2015-03-01 ENCOUNTER — Ambulatory Visit (INDEPENDENT_AMBULATORY_CARE_PROVIDER_SITE_OTHER): Payer: Medicare Other | Admitting: Medical

## 2015-03-01 VITALS — BP 123/63 | HR 62 | Temp 98.3°F | Ht 64.0 in | Wt 153.4 lb

## 2015-03-01 DIAGNOSIS — H6123 Impacted cerumen, bilateral: Secondary | ICD-10-CM

## 2015-03-01 DIAGNOSIS — H6120 Impacted cerumen, unspecified ear: Secondary | ICD-10-CM | POA: Insufficient documentation

## 2015-03-01 NOTE — Progress Notes (Signed)
Pre visit review using our clinic review tool, if applicable. No additional management support is needed unless otherwise documented below in the visit note. 

## 2015-03-01 NOTE — Patient Instructions (Addendum)
LPN did irrigate the ear both sides completely. Lt tm faint red only. Both tm intact. If over next few days any ear pain let us know. Pt reports no pain post procedure and can hear.  Advised use debrox of peroxide for future obstruction of wax as wax softening is big help.  Follow up as needed

## 2015-03-01 NOTE — Progress Notes (Addendum)
Subjective:    Patient ID: Amy Perkins, female    DOB: [birth year], 64 y.o.   MRN: [id number]  HPI  Pt in states with ears feeling clogged. Left side is worse than rt side. Pt using some peroxide over past week. No fever, no chills, no sweats. No ear pain. No uri or allergy signs or symptoms.    Review of Systems  Constitutional: Negative for fever, chills and fatigue.  HENT: Negative for congestion, facial swelling, postnasal drip, sinus pressure and tinnitus.        Ear wax obstruction both sides.  Respiratory: Negative for cough, shortness of breath and wheezing.   Cardiovascular: Negative for chest pain and palpitations.  Musculoskeletal: Negative for back pain.  Hematological: Negative for adenopathy. Does not bruise/bleed easily.  Psychiatric/Behavioral: Negative for behavioral problems and confusion.    Past Medical History  Diagnosis Date  . Depression   . Hyperlipidemia   . Migraines     History   Social History  . Marital Status: Divorced    Spouse Name: N/A  . Number of Children: 2  . Years of Education: college   Occupational History  . Teacher     [employer]   Social History Main Topics  . Smoking status: Never Smoker   . Smokeless tobacco: Never Used  . Alcohol Use: 0.6 oz/week    1 Glasses of wine per week     Comment: wine daily -OCC  . Drug Use: No  . Sexual Activity: No   Other Topics Concern  . Not on file   Social History Narrative   Divorced, both children live locally.   Patient works part time at [employer]. College education.   Left handed.   Caffeine- two cups coffee daily.    Past Surgical History  Procedure Laterality Date  . Laser vein surgery    . Tonsillectomy    . Dilation and curettage of uterus      x2    Family History  Problem Relation Age of Onset  . Coronary artery disease Father   . Stroke Father   . Parkinson's disease Father   . Hypertension Mother   . Diabetes Mother   . Stroke Mother   .  Coronary artery disease Brother     No Known Allergies  Current Outpatient Prescriptions on File Prior to Visit  Medication Sig Dispense Refill  . ALPRAZolam (XANAX) 0.5 MG tablet Take 1/2 tablet by mouth 2 times daily , and 1 tablet at bedtime.    Marland Kitchen aspirin 81 MG tablet Take 81 mg by mouth daily.      . Biotin 1000 MCG tablet Take 1,000 mcg by mouth daily.      . Calcium Carbonate (CALCIUM 600) 1500 MG TABS Take by mouth 2 (two) times daily.      . citalopram (CELEXA) 40 MG tablet Take 1 tablet (40 mg total) by mouth daily. 30 tablet 5  . Glucosamine-Chondroit-Vit C-Mn (GLUCOSAMINE CHONDR 1500 COMPLX PO) Take 1,500 mg by mouth daily.    . Ibuprofen 200 MG CAPS Take by mouth 3 (three) times daily.     Marland Kitchen levothyroxine (SYNTHROID, LEVOTHROID) 50 MCG tablet Take 1 tablet (50 mcg total) by mouth daily. 90 tablet 1  . magnesium oxide (MAG-OX) 400 MG tablet Take 400 mg by mouth daily.    . Multiple Vitamin (MULTIVITAMIN) tablet Take 1 tablet by mouth daily.      . riboflavin (VITAMIN B-2) 100 MG TABS tablet Take 100  mg by mouth 2 (two) times daily.    . rizatriptan (MAXALT-MLT) 5 MG disintegrating tablet Take 1 tablet (5 mg total) by mouth as needed. May repeat in 2 hours if needed 15 tablet 6  . simvastatin (ZOCOR) 40 MG tablet TAKE 1 TABLET (40 MG TOTAL) BY MOUTH AT BEDTIME. 90 tablet 1  . topiramate (TOPAMAX) 50 MG tablet Pt is taking 50mg  po am and 100mg  po pm. 90 tablet 6   No current facility-administered medications on file prior to visit.    BP 123/63 mmHg  Pulse 62  Temp(Src) 98.3 F (36.8 C) (Oral)  Ht 5\' 4"  (1.626 m)  Wt 153 lb 6.4 oz (69.582 kg)  BMI 26.32 kg/m2  SpO2 98%       Objective:   Physical Exam     General  Mental Status - Alert. General Appearance - Well groomed. Not in acute distress.  Skin Rashes- No Rashes.  HEENT Head- Normal. Ear Auditory Canal - Left severe wax obtruction prelavage.  Rt side moderate obstruction. After cleared 100% both  sides-  Right - Tympanic Membrane- Left- normal post lavage.Right- faint pink red post lavage.  Eye Sclera/Conjunctiva- Left- Normal. Right- Normal. Nose & Sinuses Nasal Mucosa- Left-  Boggy and Congested. Right-  Boggy and  Congested. No Bilateral maxillary and  No frontal sinus pressure.  Neck Neck- Supple. No Masses.  Lungs- Clear, even and unlabored. Heart- regular rate and rhythm. Neurologic- CNII- XII grossly intact.       Assessment & Plan:  Cerumen impaction- LPN did irrigate the ear both sides completely. Lt tm faint red only. Both tm intact. If over next few days any ear pain let us know. Pt reports no pain post procedure and can hear.  Advised use debrox of peroxide for future obstruction of wax as wax softening is big help.  Follow up as needed

## 2015-03-19 ENCOUNTER — Ambulatory Visit: Payer: BC Managed Care – PPO | Admitting: Family Medicine

## 2015-04-01 ENCOUNTER — Encounter: Payer: Self-pay | Admitting: Family Medicine

## 2015-04-01 ENCOUNTER — Encounter: Payer: Self-pay | Admitting: General Practice

## 2015-04-01 ENCOUNTER — Ambulatory Visit (INDEPENDENT_AMBULATORY_CARE_PROVIDER_SITE_OTHER): Payer: Medicare Other | Admitting: Family Medicine

## 2015-04-01 VITALS — BP 122/68 | HR 58 | Temp 98.0°F | Resp 17 | Ht 64.0 in | Wt 151.2 lb

## 2015-04-01 DIAGNOSIS — E785 Hyperlipidemia, unspecified: Secondary | ICD-10-CM

## 2015-04-01 DIAGNOSIS — Z1283 Encounter for screening for malignant neoplasm of skin: Secondary | ICD-10-CM | POA: Diagnosis not present

## 2015-04-01 DIAGNOSIS — Z23 Encounter for immunization: Secondary | ICD-10-CM | POA: Diagnosis not present

## 2015-04-01 LAB — LIPID PANEL
Cholesterol: 185 mg/dL (ref 0–200)
HDL: 53.9 mg/dL (ref >39.00)
LDL Cholesterol: 112 mg/dL — ABNORMAL HIGH (ref 0–99)
NonHDL: 130.72
Total CHOL/HDL Ratio: 3
Triglycerides: 94 mg/dL (ref 0.0–149.0)
VLDL: 18.8 mg/dL (ref 0.0–40.0)

## 2015-04-01 LAB — BASIC METABOLIC PANEL
BUN: 20 mg/dL (ref 6–23)
CO2: 28 mEq/L (ref 19–32)
Calcium: 9.8 mg/dL (ref 8.4–10.5)
Chloride: 107 mEq/L (ref 96–112)
Creatinine, Ser: 0.85 mg/dL (ref 0.40–1.20)
GFR: 71.31 mL/min (ref >60.00)
Glucose, Bld: 119 mg/dL — ABNORMAL HIGH (ref 70–99)
Potassium: 4 mEq/L (ref 3.5–5.1)
Sodium: 142 mEq/L (ref 135–145)

## 2015-04-01 LAB — HEPATIC FUNCTION PANEL
ALT: 21 U/L (ref 0–35)
AST: 16 U/L (ref 0–37)
Albumin: 4.5 g/dL (ref 3.5–5.2)
Alkaline Phosphatase: 65 U/L (ref 39–117)
Bilirubin, Direct: 0.1 mg/dL (ref 0.0–0.3)
Total Bilirubin: 0.4 mg/dL (ref 0.2–1.2)
Total Protein: 6.9 g/dL (ref 6.0–8.3)

## 2015-04-01 NOTE — Progress Notes (Signed)
Pre visit review using our clinic review tool, if applicable. No additional management support is needed unless otherwise documented below in the visit note. 

## 2015-04-01 NOTE — Addendum Note (Signed)
Addended by: Kris Hartmann on: 04/01/2015 09:30 AM   Modules accepted: Orders

## 2015-04-01 NOTE — Patient Instructions (Signed)
Schedule your complete physical in 6 months We'll notify you of your lab results and make any changes if needed Keep up the good work on healthy diet and regular exercise- you look great! We'll call you with your Dermatology appt Call with any questions or concerns Enjoy the rest of your summer!!!

## 2015-04-01 NOTE — Progress Notes (Signed)
   Subjective:    Patient ID: Amy Perkins, female    DOB: [birth year], 64 y.o.   MRN: [id number]  HPI Hyperlipidemia- chronic problem, on Simvastatin.  Exercising regularly.  Denies CP, SOB, HAs, visual changes, edema, abd pain, N/V, myalgias.   Review of Systems For ROS see HPI     Objective:   Physical Exam  Constitutional: She is oriented to person, place, and time. She appears well-developed and well-nourished. No distress.  HENT:  Head: Normocephalic and atraumatic.  Eyes: Conjunctivae and EOM are normal. Pupils are equal, round, and reactive to light.  Neck: Normal range of motion. Neck supple. No thyromegaly present.  Cardiovascular: Normal rate, regular rhythm, normal heart sounds and intact distal pulses.   No murmur heard. Pulmonary/Chest: Effort normal and breath sounds normal. No respiratory distress.  Abdominal: Soft. She exhibits no distension. There is no tenderness.  Musculoskeletal: She exhibits no edema.  Lymphadenopathy:    She has no cervical adenopathy.  Neurological: She is alert and oriented to person, place, and time.  Skin: Skin is warm and dry.  Psychiatric: She has a normal mood and affect. Her behavior is normal.  Vitals reviewed.         Assessment & Plan:

## 2015-04-01 NOTE — Assessment & Plan Note (Signed)
Chronic problem.  Tolerating statin w/o difficulty.  Applauded efforts at healthy diet and regular exercise.  Check labs.  Adjust meds prn  

## 2015-04-10 ENCOUNTER — Other Ambulatory Visit: Payer: Self-pay | Admitting: Family Medicine

## 2015-04-12 NOTE — Telephone Encounter (Signed)
Medication filled to pharmacy as requested.   

## 2015-05-13 ENCOUNTER — Other Ambulatory Visit: Payer: Self-pay | Admitting: Dermatology

## 2015-05-17 ENCOUNTER — Other Ambulatory Visit: Payer: Self-pay | Admitting: Family Medicine

## 2015-05-17 NOTE — Telephone Encounter (Signed)
Medication filled to pharmacy as requested.   

## 2015-07-29 ENCOUNTER — Encounter: Payer: Self-pay | Admitting: Nurse Practitioner

## 2015-07-29 ENCOUNTER — Ambulatory Visit (INDEPENDENT_AMBULATORY_CARE_PROVIDER_SITE_OTHER): Payer: Medicare Other | Admitting: Nurse Practitioner

## 2015-07-29 VITALS — BP 120/67 | HR 65 | Ht 64.0 in | Wt 150.0 lb

## 2015-07-29 DIAGNOSIS — G43001 Migraine without aura, not intractable, with status migrainosus: Secondary | ICD-10-CM

## 2015-07-29 DIAGNOSIS — G43009 Migraine without aura, not intractable, without status migrainosus: Secondary | ICD-10-CM | POA: Diagnosis not present

## 2015-07-29 MED ORDER — TOPIRAMATE 50 MG PO TABS: ORAL_TABLET | ORAL | Status: DC

## 2015-07-29 NOTE — Progress Notes (Signed)
I have reviewed and agreed above plan. 

## 2015-07-29 NOTE — Progress Notes (Signed)
[facility]  PATIENT: Amy Perkins DOB: 26-Jan-1951   REASON FOR VISIT: follow-up for migraine headaches HISTORY FROM:patient    HISTORY OF PRESENT ILLNESS: HISTORY: she has past medical history of migraine , anxiety and hyperlipidemia and depression, she is a retired Education officer, museum. Since January 2014, without clear trigger event, she began to have constant low grade 2 out of 10 throbbing headaches, started at left parietal region, also involve both parietal region, during last months, she also had 2 episodes of her typical migraine, blurry vision, followed by light sensitivity, noise sensitivity, mild nausea, low-grade headache, worsening by movement, relieved by resting, and take ibuprofen.   She denies jaw claudication, no chewing difficulty, no diffuse muscle achy pain   She had 3 episodes of migraine headache since last visit in September 27 2012, related to her sinus infection, relieved by ibuprofen, Tylenol in 30 minutes, while she was taking topiramate 25 mg 2 tablets twice a day, her headache has much improved, only intermittent mild left parietal area throbbing low-grade headaches, no significant side effect  She has a history of migraines that are in good control with Topamax. She has not had to use for Maxalt very much. She has a new complaint today of daytime sleepiness. She snores at night but is not sure if she quits breathing or not. She has a recent injury to her left hand for which she received a steroid injection last month and continues to have pain over the left thumb area. She returns for reevaluation UPDATE 01/27/15 Amy Perkins, 64 year old female returns for follow-up. She was last seen in the office by Dr. Krista Blue 10/27/2014. She has a history of migraines and her Topamax was increased to 100 mg twice daily at her last visit and she claims this caused more headaches she cut back to a total dose of 150 daily and her headaches are in better control. She  takes Maxalt acutely she also takes magnesium oxide and riboflavin which has been beneficial. She returns for reevaluation UPDATE 07/29/2015 Amy Perkins, 64 year old female returns for follow-up. She has a history of migraine headaches and is currently on Topamax 50 in the morning and 100 at night with good relief. She has not had to use her Maxalt since last seen. She is also on magnesium oxide and riboflavin. These have been beneficial as well .She knows her migraine triggers and tries to avoid those. She has had no interval medical issues since last seen. She returns for reevaluation and refills   REVIEW OF SYSTEMS: Full 14 system review of systems performed and notable only for those listed, all others are neg:  Constitutional: neg  Cardiovascular: neg Ear/Nose/Throat: neg  Skin: neg Eyes: neg Respiratory: neg Gastroitestinal: neg  Hematology/Lymphatic: neg  Endocrine: neg Musculoskeletal:neg Allergy/Immunology: neg Neurological: migraine headaches Psychiatric: nanxiety Sleep : neg   ALLERGIES: No Known Allergies  HOME MEDICATIONS: Outpatient Prescriptions Prior to Visit  Medication Sig Dispense Refill  . ALPRAZolam (XANAX) 0.5 MG tablet Take 1/2 tablet by mouth 2 times daily , and 1 tablet at bedtime.    Marland Kitchen aspirin 81 MG tablet Take 81 mg by mouth daily.      . Biotin 1000 MCG tablet Take 1,000 mcg by mouth daily.      . Calcium Carbonate (CALCIUM 600) 1500 MG TABS Take by mouth 2 (two) times daily.      . citalopram (CELEXA) 40 MG tablet Take 1 tablet (40 mg total) by mouth daily. 30 tablet 5  .  Glucosamine-Chondroit-Vit C-Mn (GLUCOSAMINE CHONDR 1500 COMPLX PO) Take 1,500 mg by mouth daily.    . Ibuprofen 200 MG CAPS Take by mouth 3 (three) times daily.     Marland Kitchen levothyroxine (SYNTHROID, LEVOTHROID) 50 MCG tablet TAKE 1 TABLET (50 MCG TOTAL) BY MOUTH DAILY. 90 tablet 1  . magnesium oxide (MAG-OX) 400 MG tablet Take 400 mg by mouth daily.    . Multiple Vitamin (MULTIVITAMIN)  tablet Take 1 tablet by mouth daily.      . riboflavin (VITAMIN B-2) 100 MG TABS tablet Take 100 mg by mouth 2 (two) times daily.    . rizatriptan (MAXALT-MLT) 5 MG disintegrating tablet Take 1 tablet (5 mg total) by mouth as needed. May repeat in 2 hours if needed 15 tablet 6  . simvastatin (ZOCOR) 40 MG tablet TAKE 1 TABLET BY MOUTH AT BEDTIME 90 tablet 1  . topiramate (TOPAMAX) 50 MG tablet Pt is taking 50mg  po am and 100mg  po pm. 90 tablet 6   No facility-administered medications prior to visit.    PAST MEDICAL HISTORY: Past Medical History  Diagnosis Date  . Depression   . Hyperlipidemia   . Migraines     PAST SURGICAL HISTORY: Past Surgical History  Procedure Laterality Date  . Laser vein surgery    . Tonsillectomy    . Dilation and curettage of uterus      x2    FAMILY HISTORY: Family History  Problem Relation Age of Onset  . Coronary artery disease Father   . Stroke Father   . Parkinson's disease Father   . Hypertension Mother   . Diabetes Mother   . Stroke Mother   . Coronary artery disease Brother     SOCIAL HISTORY: Social History   Social History  . Marital Status: Divorced    Spouse Name: N/A  . Number of Children: 2  . Years of Education: college   Occupational History  . Teacher     [employer]   Social History Main Topics  . Smoking status: Never Smoker   . Smokeless tobacco: Never Used  . Alcohol Use: 0.6 oz/week    1 Glasses of wine per week     Comment: wine daily -OCC  . Drug Use: No  . Sexual Activity: No   Other Topics Concern  . Not on file   Social History Narrative   Divorced, both children live locally.   Patient works part time at [employer]. College education.   Left handed.   Caffeine- two cups coffee daily.     PHYSICAL EXAM  Filed Vitals:   07/29/15 0937  BP: 120/67  Pulse: 65  Height: 5\' 4"  (1.626 m)  Weight: 150 lb (68.04 kg)   Body mass index is 25.73 kg/(m^2). Generalized: Well developed, in no  acute distress  Head: normocephalic and atraumatic,. Oropharynx benign  Neck: Supple, no carotid bruits  Musculoskeletal: No deformity   Neurological examination   Mentation: Alert oriented to time, place, history taking. Attention span and concentration appropriate. Recent and remote memory intact. Follows all commands speech and language fluent.   Cranial nerve II-XII: Pupils were equal round reactive to light extraocular movements were full, visual field were full on confrontational test. Facial sensation and strength were normal. hearing was intact to finger rubbing bilaterally. Uvula tongue midline. head turning and shoulder shrug were normal and symmetric.Tongue protrusion into cheek strength was normal. Motor: normal bulk and tone, full strength in the BUE, BLE, fine finger movements normal, no pronator  drift. No focal weakness Sensory: normal and symmetric to light touch, pinprick, and Vibration, proprioception  Coordination: finger-nose-finger, heel-to-shin bilaterally, no dysmetria Reflexes: Brachioradialis 2/2, biceps 2/2, triceps 2/2, patellar 2/2, Achilles 2/2, plantar responses were flexor bilaterally. Gait and Station: Rising up from seated position without assistance, normal stance, moderate stride, good arm swing, smooth turning, able to perform tiptoe, and heel walking without difficulty. Tandem gait is steady   DIAGNOSTIC DATA (LABS, IMAGING, TESTING) - I reviewed patient records, labs, notes, testing and imaging myself where available.      Component Value Date/Time   NA 142 04/01/2015 0921   K 4.0 04/01/2015 0921   CL 107 04/01/2015 0921   CO2 28 04/01/2015 0921   GLUCOSE 119* 04/01/2015 0921   GLUCOSE 56 02/08/2010   BUN 20 04/01/2015 0921   CREATININE 0.85 04/01/2015 0921   CALCIUM 9.8 04/01/2015 0921   PROT 6.9 04/01/2015 0921   ALBUMIN 4.5 04/01/2015 0921   AST 16 04/01/2015 0921   ALT 21 04/01/2015 0921   ALKPHOS 65 04/01/2015 0921   BILITOT 0.4  04/01/2015 0921   GFRNONAA 90.72 02/15/2010 0849   Lab Results  Component Value Date   CHOL 185 04/01/2015   HDL 53.90 04/01/2015   LDLCALC 112* 04/01/2015   TRIG 94.0 04/01/2015   CHOLHDL 3 04/01/2015    ASSESSMENT AND PLAN  64 y.o. year old female  has a past medical history of Depression; Hyperlipidemia; and Migraines. here to follow up. Her migraines are in excellent control with Topamax.  PLAN: Continue Topamax at current dose will refill Continue Maxalt acutely does not need refills Continue magnesium and riboflavin OTC Call for increase in headaches Avoid headache triggers Follow-up in 8 months Dennie Bible, [facility], [facility], [facility] [address], [place], [place] [address] [phone]

## 2015-07-29 NOTE — Patient Instructions (Signed)
Continue Topamax at current dose will refill Continue Maxalt acutely does not need refills Continue magnesium and riboflavin Call for increase in headaches Avoid headache triggers Follow-up in 8 months

## 2015-10-04 ENCOUNTER — Encounter: Payer: Self-pay | Admitting: Behavioral Health

## 2015-10-04 ENCOUNTER — Telehealth: Payer: Self-pay | Admitting: Behavioral Health

## 2015-10-04 NOTE — Telephone Encounter (Signed)
Pre-Visit Call completed with patient and chart updated.   Pre-Visit Info documented in Specialty Comments under [employer].    

## 2015-10-05 ENCOUNTER — Other Ambulatory Visit: Payer: Self-pay | Admitting: Family Medicine

## 2015-10-05 ENCOUNTER — Encounter: Payer: Self-pay | Admitting: General Practice

## 2015-10-05 ENCOUNTER — Encounter: Payer: Self-pay | Admitting: Family Medicine

## 2015-10-05 ENCOUNTER — Ambulatory Visit (INDEPENDENT_AMBULATORY_CARE_PROVIDER_SITE_OTHER): Payer: Medicare Other | Admitting: Family Medicine

## 2015-10-05 VITALS — BP 112/70 | HR 63 | Temp 98.0°F | Ht 64.0 in | Wt 146.6 lb

## 2015-10-05 DIAGNOSIS — E785 Hyperlipidemia, unspecified: Secondary | ICD-10-CM | POA: Diagnosis not present

## 2015-10-05 DIAGNOSIS — Z0000 Encounter for general adult medical examination without abnormal findings: Secondary | ICD-10-CM

## 2015-10-05 LAB — CBC WITH DIFFERENTIAL/PLATELET
Basophils Absolute: 0 10*3/uL (ref 0.0–0.1)
Basophils Relative: 0.5 % (ref 0.0–3.0)
Eosinophils Absolute: 0.2 10*3/uL (ref 0.0–0.7)
Eosinophils Relative: 2.7 % (ref 0.0–5.0)
HCT: 42.9 % (ref 36.0–46.0)
Hemoglobin: 14.8 g/dL (ref 12.0–15.0)
Lymphocytes Relative: 28.2 % (ref 12.0–46.0)
Lymphs Abs: 1.8 10*3/uL (ref 0.7–4.0)
MCHC: 34.6 g/dL (ref 30.0–36.0)
MCV: 88 fl (ref 78.0–100.0)
Monocytes Absolute: 0.5 10*3/uL (ref 0.1–1.0)
Monocytes Relative: 7.2 % (ref 3.0–12.0)
Neutro Abs: 4 10*3/uL (ref 1.4–7.7)
Neutrophils Relative %: 61.4 % (ref 43.0–77.0)
Platelets: 233 10*3/uL (ref 150.0–400.0)
RBC: 4.87 Mil/uL (ref 3.87–5.11)
RDW: 13.1 % (ref 11.5–15.5)
WBC: 6.5 10*3/uL (ref 4.0–10.5)

## 2015-10-05 LAB — HEPATIC FUNCTION PANEL
ALT: 20 U/L (ref 0–35)
AST: 17 U/L (ref 0–37)
Albumin: 4.4 g/dL (ref 3.5–5.2)
Alkaline Phosphatase: 52 U/L (ref 39–117)
Bilirubin, Direct: 0 mg/dL (ref 0.0–0.3)
Total Bilirubin: 0.3 mg/dL (ref 0.2–1.2)
Total Protein: 6.6 g/dL (ref 6.0–8.3)

## 2015-10-05 LAB — LIPID PANEL
Cholesterol: 167 mg/dL (ref 0–200)
HDL: 54.3 mg/dL (ref >39.00)
LDL Cholesterol: 97 mg/dL (ref 0–99)
NonHDL: 113.04
Total CHOL/HDL Ratio: 3
Triglycerides: 82 mg/dL (ref 0.0–149.0)
VLDL: 16.4 mg/dL (ref 0.0–40.0)

## 2015-10-05 LAB — BASIC METABOLIC PANEL
BUN: 21 mg/dL (ref 6–23)
CO2: 28 mEq/L (ref 19–32)
Calcium: 9.7 mg/dL (ref 8.4–10.5)
Chloride: 109 mEq/L (ref 96–112)
Creatinine, Ser: 0.86 mg/dL (ref 0.40–1.20)
GFR: 70.24 mL/min (ref >60.00)
Glucose, Bld: 114 mg/dL — ABNORMAL HIGH (ref 70–99)
Potassium: 3.8 mEq/L (ref 3.5–5.1)
Sodium: 141 mEq/L (ref 135–145)

## 2015-10-05 LAB — TSH: TSH: 2.23 u[IU]/mL (ref 0.35–4.50)

## 2015-10-05 NOTE — Patient Instructions (Signed)
Follow up in 6 months to recheck cholesterol We'll notify you of your lab results and make any changes if needed Keep up the good work on healthy diet and regular exercise- you look great!!! You are up to date on colonoscopy until 2021- yay!! Follow up with Dr Helane Rima as scheduled Call with any questions or concerns If you want to join [place] at the new [place] office, any scheduled appointments will automatically transfer and we will see you at [address] [place] [address] [place], [place] [address] [facility]) Happy Valentine's Day!!!

## 2015-10-05 NOTE — Telephone Encounter (Signed)
Medication filled to pharmacy as requested.   

## 2015-10-05 NOTE — Assessment & Plan Note (Signed)
Pt's PE WNL.  UTD on GYN, colonoscopy.  Written screening schedule updated and given to pt.  Check labs.  Anticipatory guidance provided.

## 2015-10-05 NOTE — Progress Notes (Signed)
Pre visit review using our clinic review tool, if applicable. No additional management support is needed unless otherwise documented below in the visit note. 

## 2015-10-05 NOTE — Progress Notes (Signed)
   Subjective:    Patient ID: Amy Perkins, female    DOB: 1950/12/06, 65 y.o.   MRN: [id number]  HPI Here today for CPE.  Risk Factors: Hyperlipidemia- chronic problem, on Simvastatin daily Physical Activity: exercising regularly Fall Risk: low Depression: chronic problem, adequate control on Celexa Hearing: normal to conversational tones and whispered voice at 6 feet ADL's: independent Cognitive: normal linear thought process, memory and attention intact Home Safety: safe at home Height, Weight, BMI, Visual Acuity: see vitals, vision corrected to 20/20 w/ glasses Counseling: UTD on pap, mammo, DEXA (Dr Helane Rima), UTD on colonoscopy- not due until 2021 (Dr Collene Mares) Care team reviewed and updated w/ pt Labs Ordered: See A&P Care Plan: See A&P    Review of Systems Patient reports no vision/ hearing changes, adenopathy,fever, weight change,  persistant/recurrent hoarseness , swallowing issues, chest pain, palpitations, edema, persistant/recurrent cough, hemoptysis, dyspnea (rest/exertional/paroxysmal nocturnal), gastrointestinal bleeding (melena, rectal bleeding), abdominal pain, significant heartburn, bowel changes, GU symptoms (dysuria, hematuria, incontinence), Gyn symptoms (abnormal  bleeding, pain),  syncope, focal weakness, memory loss, numbness & tingling, skin/hair/nail changes, abnormal bruising or bleeding, anxiety, or depression.     Objective:   Physical Exam General Appearance:    Alert, cooperative, no distress, appears stated age  Head:    Normocephalic, without obvious abnormality, atraumatic  Eyes:    PERRL, conjunctiva/corneas clear, EOM's intact, fundi    benign, both eyes  Ears:    Normal TM's and external ear canals, both ears  Nose:   Nares normal, septum midline, mucosa normal, no drainage    or sinus tenderness  Throat:   Lips, mucosa, and tongue normal; teeth and gums normal  Neck:   Supple, symmetrical, trachea midline, no adenopathy;    Thyroid: no  enlargement/tenderness/nodules  Back:     Symmetric, no curvature, ROM normal, no CVA tenderness  Lungs:     Clear to auscultation bilaterally, respirations unlabored  Chest Wall:    No tenderness or deformity   Heart:    Regular rate and rhythm, S1 and S2 normal, no murmur, rub   or gallop  Breast Exam:    Deferred to GYN  Abdomen:     Soft, non-tender, bowel sounds active all four quadrants,    no masses, no organomegaly  Genitalia:    Deferred to GYN  Rectal:    Extremities:   Extremities normal, atraumatic, no cyanosis or edema  Pulses:   2+ and symmetric all extremities  Skin:   Skin color, texture, turgor normal, no rashes or lesions  Lymph nodes:   Cervical, supraclavicular, and axillary nodes normal  Neurologic:   CNII-XII intact, normal strength, sensation and reflexes    throughout          Assessment & Plan:

## 2015-12-04 LAB — HM MAMMOGRAPHY: HM Mammogram: NORMAL (ref 0–4)

## 2015-12-04 LAB — HM PAP SMEAR

## 2015-12-07 ENCOUNTER — Other Ambulatory Visit: Payer: Self-pay | Admitting: General Practice

## 2015-12-07 MED ORDER — SIMVASTATIN 40 MG PO TABS: 40.0000 mg | ORAL_TABLET | Freq: Every day | ORAL | Status: DC

## 2016-03-28 ENCOUNTER — Ambulatory Visit: Payer: Medicare Other | Admitting: Nurse Practitioner

## 2016-04-03 ENCOUNTER — Ambulatory Visit (INDEPENDENT_AMBULATORY_CARE_PROVIDER_SITE_OTHER): Payer: Medicare Other | Admitting: Family Medicine

## 2016-04-03 ENCOUNTER — Encounter: Payer: Self-pay | Admitting: Family Medicine

## 2016-04-03 VITALS — BP 114/76 | HR 57 | Temp 98.3°F | Resp 16 | Ht 64.0 in | Wt 144.5 lb

## 2016-04-03 DIAGNOSIS — Z23 Encounter for immunization: Secondary | ICD-10-CM

## 2016-04-03 DIAGNOSIS — E785 Hyperlipidemia, unspecified: Secondary | ICD-10-CM

## 2016-04-03 DIAGNOSIS — E038 Other specified hypothyroidism: Secondary | ICD-10-CM | POA: Diagnosis not present

## 2016-04-03 DIAGNOSIS — E039 Hypothyroidism, unspecified: Secondary | ICD-10-CM | POA: Insufficient documentation

## 2016-04-03 LAB — HEPATIC FUNCTION PANEL
ALT: 19 U/L (ref 0–35)
AST: 16 U/L (ref 0–37)
Albumin: 4.4 g/dL (ref 3.5–5.2)
Alkaline Phosphatase: 51 U/L (ref 39–117)
Bilirubin, Direct: 0.1 mg/dL (ref 0.0–0.3)
Total Bilirubin: 0.4 mg/dL (ref 0.2–1.2)
Total Protein: 6.1 g/dL (ref 6.0–8.3)

## 2016-04-03 LAB — BASIC METABOLIC PANEL
BUN: 21 mg/dL (ref 6–23)
CO2: 27 mEq/L (ref 19–32)
Calcium: 9.6 mg/dL (ref 8.4–10.5)
Chloride: 109 mEq/L (ref 96–112)
Creatinine, Ser: 0.87 mg/dL (ref 0.40–1.20)
GFR: 69.21 mL/min (ref >60.00)
Glucose, Bld: 106 mg/dL — ABNORMAL HIGH (ref 70–99)
Potassium: 4 mEq/L (ref 3.5–5.1)
Sodium: 143 mEq/L (ref 135–145)

## 2016-04-03 LAB — LIPID PANEL
Cholesterol: 172 mg/dL (ref 0–200)
HDL: 57 mg/dL (ref >39.00)
LDL Cholesterol: 99 mg/dL (ref 0–99)
NonHDL: 114.78
Total CHOL/HDL Ratio: 3
Triglycerides: 80 mg/dL (ref 0.0–149.0)
VLDL: 16 mg/dL (ref 0.0–40.0)

## 2016-04-03 LAB — TSH: TSH: 2.12 u[IU]/mL (ref 0.35–4.50)

## 2016-04-03 MED ORDER — LEVOTHYROXINE SODIUM 50 MCG PO TABS: 50.0000 ug | ORAL_TABLET | Freq: Every day | ORAL | 1 refills | Status: DC

## 2016-04-03 NOTE — Progress Notes (Signed)
Pre visit review using our clinic review tool, if applicable. No additional management support is needed unless otherwise documented below in the visit note. 

## 2016-04-03 NOTE — Assessment & Plan Note (Signed)
Chronic problem.  Tolerating statin w/o difficulty.  Applauded her efforts on healthy diet and regular exercise.  Check labs.  Adjust meds prn

## 2016-04-03 NOTE — Addendum Note (Signed)
Addended by: Desmond Dike L on: 04/03/2016 10:03 AM   Modules accepted: Orders

## 2016-04-03 NOTE — Patient Instructions (Signed)
Schedule your complete physical in 6 months We'll notify you of your lab results and make any changes if needed Continue to work on healthy diet and regular exercise- you look great!!! Call with any questions or concerns Enjoy the rest of your summer!!! 

## 2016-04-03 NOTE — Assessment & Plan Note (Signed)
Chronic problem.  Currently asymptomatic.  Check labs.  Adjust meds prn.  Pt expressed understanding and is in agreement w/ plan.

## 2016-04-03 NOTE — Progress Notes (Signed)
   Subjective:    Patient ID: Amy Perkins, female    DOB: Jun 19, 1951, 65 y.o.   MRN: [id number]  HPI Hyperlipidemia- chronic problem, on Simvastatin.  Pt has lost 3 lbs.  Exercising regularly.  No CP, SOB, HAs visual changes, abd pain, N/V  Hypothyroid- chronic problem, on Levothyroxine.  Denies fatigue, changes to skin/hair/nails   Review of Systems For ROS see HPI     Objective:   Physical Exam  Constitutional: She is oriented to person, place, and time. She appears well-developed and well-nourished. No distress.  HENT:  Head: Normocephalic and atraumatic.  Eyes: Conjunctivae and EOM are normal. Pupils are equal, round, and reactive to light.  Neck: Normal range of motion. Neck supple. No thyromegaly present.  Cardiovascular: Normal rate, regular rhythm, normal heart sounds and intact distal pulses.   No murmur heard. Pulmonary/Chest: Effort normal and breath sounds normal. No respiratory distress.  Abdominal: Soft. She exhibits no distension. There is no tenderness.  Musculoskeletal: She exhibits no edema.  Lymphadenopathy:    She has no cervical adenopathy.  Neurological: She is alert and oriented to person, place, and time.  Skin: Skin is warm and dry.  Psychiatric: She has a normal mood and affect. Her behavior is normal.  Vitals reviewed.         Assessment & Plan:

## 2016-04-04 ENCOUNTER — Encounter: Payer: Self-pay | Admitting: General Practice

## 2016-04-13 ENCOUNTER — Ambulatory Visit (INDEPENDENT_AMBULATORY_CARE_PROVIDER_SITE_OTHER): Payer: Medicare Other | Admitting: Nurse Practitioner

## 2016-04-13 ENCOUNTER — Encounter: Payer: Self-pay | Admitting: Nurse Practitioner

## 2016-04-13 VITALS — BP 110/72 | HR 60 | Ht 64.0 in | Wt 146.8 lb

## 2016-04-13 DIAGNOSIS — G43009 Migraine without aura, not intractable, without status migrainosus: Secondary | ICD-10-CM | POA: Diagnosis not present

## 2016-04-13 MED ORDER — TOPIRAMATE 50 MG PO TABS: 50.0000 mg | ORAL_TABLET | Freq: Two times a day (BID) | ORAL | 6 refills | Status: DC

## 2016-04-13 MED ORDER — RIZATRIPTAN BENZOATE 5 MG PO TBDP: 5.0000 mg | ORAL_TABLET | ORAL | 3 refills | Status: DC | PRN

## 2016-04-13 NOTE — Progress Notes (Signed)
I have reviewed and agreed above plan. 

## 2016-04-13 NOTE — Patient Instructions (Addendum)
Decrease  Topamax to 50mg  twice daily  will refill Continue Maxalt acutely will refill Continue magnesium and riboflavin OTC Call for increase in headaches with decrease in Topamax Avoid headache triggers Follow-up in 6 months

## 2016-04-13 NOTE — Progress Notes (Signed)
[facility]  PATIENT: Amy Perkins DOB: October 21, 1950   REASON FOR VISIT: follow-up for migraine headaches HISTORY FROM:patient    HISTORY OF PRESENT ILLNESS: HISTORY: she has past medical history of migraine , anxiety and hyperlipidemia and depression, she is a retired Education officer, [employer]. Since January 2014, without clear trigger event, she began to have constant low grade 2 out of 10 throbbing headaches, started at left parietal region, also involve both parietal region, during last months, she also had 2 episodes of her typical migraine, blurry vision, followed by light sensitivity, noise sensitivity, mild nausea, low-grade headache, worsening by movement, relieved by resting, and take ibuprofen.   She denies jaw claudication, no chewing difficulty, no diffuse muscle achy pain   She had 3 episodes of migraine headache since last visit in September 27 2012, related to her sinus infection, relieved by ibuprofen, Tylenol in 30 minutes, while she was taking topiramate 25 mg 2 tablets twice a day, her headache has much improved, only intermittent mild left parietal area throbbing low-grade headaches, no significant side effect  She has a history of migraines that are in good control with Topamax. She has not had to use for Maxalt very much. She has a new complaint today of daytime sleepiness. She snores at night but is not sure if she quits breathing or not. She has a recent injury to her left hand for which she received a steroid injection last month and continues to have pain over the left thumb area. She returns for reevaluation UPDATE 01/27/15 Amy Perkins, 65 year old female returns for follow-up. She was last seen in the office by Dr. Krista Blue 10/27/2014. She has a history of migraines and her Topamax was increased to 100 mg twice daily at her last visit and she claims this caused more headaches she cut back to a total dose of 150 daily and her headaches are in better control. She  takes Maxalt acutely she also takes magnesium oxide and riboflavin which has been beneficial. She returns for reevaluation UPDATE 04/13/16 Amy Perkins, 65 year old female returns for follow-up. She has a history of migraine headaches and is currently on Topamax 50 in the morning and 100 at night with good relief. She has not had to use her Maxalt since last seen. She is also on magnesium oxide and riboflavin. These have been beneficial as well .She knows her migraine triggers and tries to avoid those. She has had no interval medical issues since last seen. She returns for reevaluation and refills. We discussed decreasing her Topamax as she has a  headache every 2 to 3  months. REVIEW OF SYSTEMS: Full 14 system review of systems performed and notable only for those listed, all others are neg:  Constitutional: neg  Cardiovascular: neg Ear/Nose/Throat: neg  Skin: neg Eyes: neg Respiratory: neg Gastroitestinal: neg  Hematology/Lymphatic: neg  Endocrine: neg Musculoskeletal:neg Allergy/Immunology: neg Neurological: migraine headaches well controlled Psychiatric: anxiety Sleep : neg   ALLERGIES: No Known Allergies  HOME MEDICATIONS: Outpatient Medications Prior to Visit  Medication Sig Dispense Refill  . ALPRAZolam (XANAX) 0.5 MG tablet Take 1/2 tablet by mouth 2 times daily , and 1 tablet at bedtime.    Marland Kitchen aspirin 81 MG tablet Take 81 mg by mouth daily.      . Biotin 1000 MCG tablet Take 1,000 mcg by mouth daily.      . Calcium Carbonate (CALCIUM 600) 1500 MG TABS Take by mouth 2 (two) times daily.      . citalopram (  CELEXA) 40 MG tablet Take 1 tablet (40 mg total) by mouth daily. 30 tablet 5  . Glucosamine-Chondroit-Vit C-Mn (GLUCOSAMINE CHONDR 1500 COMPLX PO) Take 1,500 mg by mouth daily.    . Ibuprofen 200 MG CAPS Take by mouth 3 (three) times daily.     Marland Kitchen levothyroxine (SYNTHROID, LEVOTHROID) 50 MCG tablet Take 1 tablet (50 mcg total) by mouth daily. 90 tablet 1  . magnesium oxide  (MAG-OX) 400 MG tablet Take 400 mg by mouth daily.    . Multiple Vitamin (MULTIVITAMIN) tablet Take 1 tablet by mouth daily.      . riboflavin (VITAMIN B-2) 100 MG TABS tablet Take 100 mg by mouth 2 (two) times daily.    . rizatriptan (MAXALT-MLT) 5 MG disintegrating tablet Take 1 tablet (5 mg total) by mouth as needed. May repeat in 2 hours if needed 15 tablet 6  . simvastatin (ZOCOR) 40 MG tablet Take 1 tablet (40 mg total) by mouth at bedtime. 90 tablet 1  . topiramate (TOPAMAX) 50 MG tablet Pt is taking 50mg  po am and 100mg  po pm. 90 tablet 8   No facility-administered medications prior to visit.     PAST MEDICAL HISTORY: Past Medical History:  Diagnosis Date  . Depression   . Hyperlipidemia   . Migraines     PAST SURGICAL HISTORY: Past Surgical History:  Procedure Laterality Date  . DILATION AND CURETTAGE OF UTERUS     x2  . laser vein surgery    . TONSILLECTOMY      FAMILY HISTORY: Family History  Problem Relation Age of Onset  . Coronary artery disease Father   . Stroke Father   . Parkinson's disease Father   . Hypertension Mother   . Diabetes Mother   . Stroke Mother   . Coronary artery disease Brother     SOCIAL HISTORY: Social History   Social History  . Marital status: Divorced    Spouse name: N/A  . Number of children: 2  . Years of education: college   Occupational History  . Teacher [employer]    [employer]   Social History Main Topics  . Smoking status: Never Smoker  . Smokeless tobacco: Never Used  . Alcohol use 0.6 oz/week    1 Glasses of wine per week     Comment: wine daily -OCC  . Drug use: No  . Sexual activity: No   Other Topics Concern  . Not on file   Social History Narrative   Divorced, both children live locally.   Patient works part time at [employer]. College education.   Left handed.   Caffeine- two cups coffee daily.     PHYSICAL EXAM  Vitals:   04/13/16 0924  Weight: 146 lb 12.8 oz (66.6 kg)  Height: 5\' 4"   (1.626 m)   Body mass index is 25.2 kg/m. Generalized: Well developed, in no acute distress  Head: normocephalic and atraumatic,. Oropharynx benign  Neck: Supple, no carotid bruits  Musculoskeletal: No deformity   Neurological examination   Mentation: Alert oriented to time, place, history taking. Attention span and concentration appropriate. Recent and remote memory intact. Follows all commands speech and language fluent.   Cranial nerve II-XII: Pupils were equal round reactive to light extraocular movements were full, visual field were full on confrontational test. Facial sensation and strength were normal. hearing was intact to finger rubbing bilaterally. Uvula tongue midline. head turning and shoulder shrug were normal and symmetric.Tongue protrusion into cheek strength was normal. Motor: normal  bulk and tone, full strength in the BUE, BLE, fine finger movements normal, no pronator drift. No focal weakness Sensory: normal and symmetric to light touch, pinprick, and Vibration, proprioception  Coordination: finger-nose-finger, heel-to-shin bilaterally, no dysmetria Reflexes: Brachioradialis 2/2, biceps 2/2, triceps 2/2, patellar 2/2, Achilles 2/2, plantar responses were flexor bilaterally. Gait and Station: Rising up from seated position without assistance, normal stance, moderate stride, good arm swing, smooth turning, able to perform tiptoe, and heel walking without difficulty. Tandem gait is steady   DIAGNOSTIC DATA (LABS, IMAGING, TESTING) - I reviewed patient records, labs, notes, testing and imaging myself where available.      Component Value Date/Time   NA 143 04/03/2016 1004   K 4.0 04/03/2016 1004   CL 109 04/03/2016 1004   CO2 27 04/03/2016 1004   GLUCOSE 106 (H) 04/03/2016 1004   GLUCOSE 56 02/08/2010   BUN 21 04/03/2016 1004   CREATININE 0.87 04/03/2016 1004   CALCIUM 9.6 04/03/2016 1004   PROT 6.1 04/03/2016 1004   ALBUMIN 4.4 04/03/2016 1004   AST 16  04/03/2016 1004   ALT 19 04/03/2016 1004   ALKPHOS 51 04/03/2016 1004   BILITOT 0.4 04/03/2016 1004   GFRNONAA 90.72 02/15/2010 0849   Lab Results  Component Value Date   CHOL 172 04/03/2016   HDL 57.00 04/03/2016   LDLCALC 99 04/03/2016   TRIG 80.0 04/03/2016   CHOLHDL 3 04/03/2016    ASSESSMENT AND PLAN  65 y.o. year old female  has a past medical history of Depression; Hyperlipidemia; and Migraines. here to follow up. Her migraines are in excellent control with Topamax.  PLAN: Decrease  Topamax to 50mg  twice daily  will refill Continue Maxalt acutely will refill Continue magnesium and riboflavin OTC Call for increase in headaches Avoid headache triggers Follow-up in 6 months Dennie Bible, [facility], [facility], APRN  [facility] [address], [place], [place] [address] [phone]

## 2016-04-15 ENCOUNTER — Other Ambulatory Visit: Payer: Self-pay | Admitting: Family Medicine

## 2016-06-04 ENCOUNTER — Other Ambulatory Visit: Payer: Self-pay | Admitting: Family Medicine

## 2016-07-06 ENCOUNTER — Telehealth: Payer: Self-pay | Admitting: Nurse Practitioner

## 2016-07-06 MED ORDER — TOPIRAMATE 50 MG PO TABS: ORAL_TABLET | ORAL | 6 refills | Status: DC

## 2016-07-06 NOTE — Telephone Encounter (Signed)
Pt last seen 04-13-16.  Decrease in topamax to 50mg  po bid since migraines were less (1 every 2-3 months).  She has noted increase within the last 2 wks. and would like to go back to 50mg  po am and 100mg  po pm.  She would like new prescription relating to this increase.  [facility] [place] [facility]   I LMVM for pt that would forward to CM.

## 2016-07-06 NOTE — Telephone Encounter (Signed)
Spoke to pt and let her know that new prescription called in (at her previous dose (50mg  po am and 100mg  po pm)  She verbalized understanding.

## 2016-07-06 NOTE — Telephone Encounter (Signed)
Pt called in stating she has had an increase in migraines the last two weeks and would like to know if the topiramate (TOPAMAX) 50 MG tablet could be increased to 3 x day. She will also need a new rx . Please call and advise [phone]

## 2016-07-06 NOTE — Telephone Encounter (Signed)
[place] for increase in Topamax. Faxed to pharmacy. Please let pt know

## 2016-07-10 NOTE — Telephone Encounter (Signed)
Patient is calling back to discuss new Rx for Topamax that was called to pharmacy.  The new Rx was not changed.

## 2016-07-10 NOTE — Telephone Encounter (Signed)
I spoke to pt and she picked up old prescription.  [facility].  I spoke to [place] at the pharmacy and they related to have pt bring prescription back and they will refund (even if has used some) and will get her the updated prescription (90 tabs).  Pt informed and will take back.

## 2016-10-06 ENCOUNTER — Ambulatory Visit (INDEPENDENT_AMBULATORY_CARE_PROVIDER_SITE_OTHER): Payer: Medicare Other | Admitting: Family Medicine

## 2016-10-06 ENCOUNTER — Encounter: Payer: Self-pay | Admitting: Family Medicine

## 2016-10-06 VITALS — BP 112/72 | HR 63 | Temp 98.0°F | Resp 16 | Ht 64.0 in | Wt 151.2 lb

## 2016-10-06 DIAGNOSIS — Z0000 Encounter for general adult medical examination without abnormal findings: Secondary | ICD-10-CM | POA: Diagnosis not present

## 2016-10-06 DIAGNOSIS — E038 Other specified hypothyroidism: Secondary | ICD-10-CM

## 2016-10-06 DIAGNOSIS — E785 Hyperlipidemia, unspecified: Secondary | ICD-10-CM | POA: Diagnosis not present

## 2016-10-06 LAB — BASIC METABOLIC PANEL
BUN: 23 mg/dL (ref 6–23)
CO2: 27 mEq/L (ref 19–32)
Calcium: 9.6 mg/dL (ref 8.4–10.5)
Chloride: 108 mEq/L (ref 96–112)
Creatinine, Ser: 0.74 mg/dL (ref 0.40–1.20)
GFR: 83.29 mL/min (ref >60.00)
Glucose, Bld: 103 mg/dL — ABNORMAL HIGH (ref 70–99)
Potassium: 4.2 mEq/L (ref 3.5–5.1)
Sodium: 142 mEq/L (ref 135–145)

## 2016-10-06 LAB — CBC WITH DIFFERENTIAL/PLATELET
Basophils Absolute: 0.1 10*3/uL (ref 0.0–0.1)
Basophils Relative: 0.9 % (ref 0.0–3.0)
Eosinophils Absolute: 0.2 10*3/uL (ref 0.0–0.7)
Eosinophils Relative: 3.6 % (ref 0.0–5.0)
HCT: 44.4 % (ref 36.0–46.0)
Hemoglobin: 14.8 g/dL (ref 12.0–15.0)
Lymphocytes Relative: 28.6 % (ref 12.0–46.0)
Lymphs Abs: 1.8 10*3/uL (ref 0.7–4.0)
MCHC: 33.4 g/dL (ref 30.0–36.0)
MCV: 89.4 fl (ref 78.0–100.0)
Monocytes Absolute: 0.4 10*3/uL (ref 0.1–1.0)
Monocytes Relative: 6.3 % (ref 3.0–12.0)
Neutro Abs: 3.9 10*3/uL (ref 1.4–7.7)
Neutrophils Relative %: 60.6 % (ref 43.0–77.0)
Platelets: 238 10*3/uL (ref 150.0–400.0)
RBC: 4.97 Mil/uL (ref 3.87–5.11)
RDW: 14 % (ref 11.5–15.5)
WBC: 6.5 10*3/uL (ref 4.0–10.5)

## 2016-10-06 LAB — LIPID PANEL
Cholesterol: 185 mg/dL (ref 0–200)
HDL: 53.2 mg/dL (ref >39.00)
LDL Cholesterol: 112 mg/dL — ABNORMAL HIGH (ref 0–99)
NonHDL: 131.68
Total CHOL/HDL Ratio: 3
Triglycerides: 99 mg/dL (ref 0.0–149.0)
VLDL: 19.8 mg/dL (ref 0.0–40.0)

## 2016-10-06 LAB — HEPATIC FUNCTION PANEL
ALT: 24 U/L (ref 0–35)
AST: 16 U/L (ref 0–37)
Albumin: 4.4 g/dL (ref 3.5–5.2)
Alkaline Phosphatase: 59 U/L (ref 39–117)
Bilirubin, Direct: 0.1 mg/dL (ref 0.0–0.3)
Total Bilirubin: 0.4 mg/dL (ref 0.2–1.2)
Total Protein: 6.3 g/dL (ref 6.0–8.3)

## 2016-10-06 LAB — TSH: TSH: 1.92 u[IU]/mL (ref 0.35–4.50)

## 2016-10-06 NOTE — Progress Notes (Signed)
Pre visit review using our clinic review tool, if applicable. No additional management support is needed unless otherwise documented below in the visit note. 

## 2016-10-06 NOTE — Assessment & Plan Note (Signed)
Chronic problem.  Tolerating statin w/o difficulty.  Stressed need for healthy diet and regular exercise.  Check labs.  Adjust meds prn  

## 2016-10-06 NOTE — Assessment & Plan Note (Signed)
Pt's PE WNL.  UTD on GYN [facility]), colonoscopy, immunizations.  Written screening schedule updated and given to pt.  Check labs.  Anticipatory guidance provided.

## 2016-10-06 NOTE — Patient Instructions (Signed)
Follow up in 6 months to recheck cholesterol We'll notify you of your lab results and make any changes if needed Continue to work on healthy diet and regular exercise- you look great! You are up to date on colonoscopy until 2021- yay! Please have Dr Helane Rima send me copies of her notes so I can follow along Call with any questions or concerns Have a great weekend!!!

## 2016-10-06 NOTE — Progress Notes (Signed)
   Subjective:    Patient ID: LECHIA DELATOUR, female    DOB: Aug 30, 1950, 66 y.o.   MRN: [id number]  HPI Here today for CPE.  Risk Factors: Hypothyroid- chronic problem, on Synthroid daily Hyperlipidemia- chronic problem, on Simvastatin Physical Activity: exercising regularly Fall Risk: low Depression: chronic problem, on Celexa daily w/ good control Hearing: normal to conversational tones ADL's: independent Cognitive: normal linear thought process, memory and attention intact Home Safety: safe at home Height, Weight, BMI, Visual Acuity: see vitals, vision corrected to 20/20 w/ glasses Counseling: UTD on colonoscopy (due 2021), mammo, GYN, DEXA ([facility]).  UTD on immunizations Care team reviewed and updated Labs Ordered: See A&P Care Plan: See A&P    Review of Systems Patient reports no vision/ hearing changes, adenopathy,fever, weight change,  persistant/recurrent hoarseness , swallowing issues, chest pain, palpitations, edema, persistant/recurrent cough, hemoptysis, dyspnea (rest/exertional/paroxysmal nocturnal), gastrointestinal bleeding (melena, rectal bleeding), abdominal pain, significant heartburn, bowel changes, GU symptoms (dysuria, hematuria, incontinence), Gyn symptoms (abnormal  bleeding, pain),  syncope, focal weakness, memory loss, numbness & tingling, skin/hair/nail changes, abnormal bruising or bleeding, anxiety, or depression.     Objective:   Physical Exam General Appearance:    Alert, cooperative, no distress, appears stated age  Head:    Normocephalic, without obvious abnormality, atraumatic  Eyes:    PERRL, conjunctiva/corneas clear, EOM's intact, fundi    benign, both eyes  Ears:    Normal TM's and external ear canals, both ears  Nose:   Nares normal, septum midline, mucosa normal, no drainage    or sinus tenderness  Throat:   Lips, mucosa, and tongue normal; teeth and gums normal  Neck:   Supple, symmetrical, trachea midline, no adenopathy;    Thyroid: no  enlargement/tenderness/nodules  Back:     Symmetric, no curvature, ROM normal, no CVA tenderness  Lungs:     Clear to auscultation bilaterally, respirations unlabored  Chest Wall:    No tenderness or deformity   Heart:    Regular rate and rhythm, S1 and S2 normal, no murmur, rub   or gallop  Breast Exam:    Deferred to GYN  Abdomen:     Soft, non-tender, bowel sounds active all four quadrants,    no masses, no organomegaly  Genitalia:    Deferred to GYN  Rectal:    Extremities:   Extremities normal, atraumatic, no cyanosis or edema  Pulses:   2+ and symmetric all extremities  Skin:   Skin color, texture, turgor normal, no rashes or lesions  Lymph nodes:   Cervical, supraclavicular, and axillary nodes normal  Neurologic:   CNII-XII intact, normal strength, sensation and reflexes    throughout          Assessment & Plan:

## 2016-10-06 NOTE — Assessment & Plan Note (Signed)
Chronic problem.  Currently asymptomatic.  Check labs.  Adjust meds prn  

## 2016-10-09 ENCOUNTER — Encounter: Payer: Self-pay | Admitting: General Practice

## 2016-10-11 LAB — HM MAMMOGRAPHY

## 2016-10-11 LAB — HM DEXA SCAN: HM Dexa Scan: NORMAL

## 2016-10-12 LAB — HM PAP SMEAR

## 2016-10-16 ENCOUNTER — Ambulatory Visit (INDEPENDENT_AMBULATORY_CARE_PROVIDER_SITE_OTHER): Payer: Medicare Other | Admitting: Nurse Practitioner

## 2016-10-16 ENCOUNTER — Encounter: Payer: Self-pay | Admitting: Nurse Practitioner

## 2016-10-16 VITALS — BP 117/66 | HR 69 | Ht 64.0 in | Wt 155.6 lb

## 2016-10-16 DIAGNOSIS — G43001 Migraine without aura, not intractable, with status migrainosus: Secondary | ICD-10-CM

## 2016-10-16 NOTE — Progress Notes (Signed)
I have reviewed and agreed above plan. 

## 2016-10-16 NOTE — Progress Notes (Signed)
[facility]  PATIENT: Amy Perkins DOB: 1951/02/19   REASON FOR VISIT: follow-up for migraine headaches HISTORY FROM:patient    HISTORY OF PRESENT ILLNESS: HISTORY: she has past medical history of migraine , anxiety and hyperlipidemia and depression, she is a retired Education officer, [employer]. Since January 2014, without clear trigger event, she began to have constant low grade 2 out of 10 throbbing headaches, started at left parietal region, also involve both parietal region, during last months, she also had 2 episodes of her typical migraine, blurry vision, followed by light sensitivity, noise sensitivity, mild nausea, low-grade headache, worsening by movement, relieved by resting, and take ibuprofen.   She denies jaw claudication, no chewing difficulty, no diffuse muscle achy pain   She had 3 episodes of migraine headache since last visit in September 27 2012, related to her sinus infection, relieved by ibuprofen, Tylenol in 30 minutes, while she was taking topiramate 25 mg 2 tablets twice a day, her headache has much improved, only intermittent mild left parietal area throbbing low-grade headaches, no significant side effect  She has a history of migraines that are in good control with Topamax. She has not had to use for Maxalt very much. She has a new complaint today of daytime sleepiness. She snores at night but is not sure if she quits breathing or not. She has a recent injury to her left hand for which she received a steroid injection last month and continues to have pain over the left thumb area. She returns for reevaluation UPDATE 6/8/16CM [name], 66 year old female returns for follow-up. She was last seen in the office by Dr. Krista Blue 10/27/2014. She has a history of migraines and her Topamax was increased to 100 mg twice daily at her last visit and she claims this caused more headaches she cut back to a total dose of 150 daily and her headaches are in better control. She  takes Maxalt acutely she also takes magnesium oxide and riboflavin which has been beneficial. She returns for reevaluation UPDATE 04/13/16 CM [name], 66 year old female returns for follow-up. She has a history of migraine headaches and is currently on Topamax 50 in the morning and 100 at night with good relief. She has not had to use her Maxalt since last seen. She is also on magnesium oxide and riboflavin. These have been beneficial as well .She knows her migraine triggers and tries to avoid those. She has had no interval medical issues since last seen. She returns for reevaluation and refills. We discussed decreasing her Topamax as she has a  headache every 2 to 3  Months. UPDATE 02/26/2018CM [name], 66 year old female returns for follow-up with her history of migraine headaches. She is currently on Topamax, she denies any difficulty focusing problems with her memory or any paresthesias with this medication. She continues to have 1-2 headaches a week however they're much less intense and do not last long,  she denies any nausea or vomiting. She also takes riboflavin and magnesium which she has felt has been beneficial. Her migraine triggers are stress and some weather-related changes. She rarely uses Maxalt as she finds if she takes ibuprofen her headache usually goes away in 30 minutes. She returns for reevaluation REVIEW OF SYSTEMS: Full 14 system review of systems performed and notable only for those listed, all others are neg:  Constitutional: neg  Cardiovascular: neg Ear/Nose/Throat: neg  Skin: neg Eyes: neg Respiratory: neg Gastroitestinal: neg  Hematology/Lymphatic: neg  Endocrine: neg Musculoskeletal:neg Allergy/Immunology: neg Neurological:  migraine headaches  Psychiatric: anxiety, depression on Celexa Sleep : neg   ALLERGIES: No Known Allergies  HOME MEDICATIONS: Outpatient Medications Prior to Visit  Medication Sig Dispense Refill  . ALPRAZolam (XANAX) 0.5 MG tablet  Take 1/2 tablet by mouth 2 times daily , and 1 tablet at bedtime.    Marland Kitchen aspirin 81 MG tablet Take 81 mg by mouth daily.      . Biotin 1000 MCG tablet Take 1,000 mcg by mouth daily.      . Calcium Carbonate (CALCIUM 600) 1500 MG TABS Take by mouth 2 (two) times daily.      . citalopram (CELEXA) 40 MG tablet Take 1 tablet (40 mg total) by mouth daily. 30 tablet 5  . Glucosamine-Chondroit-Vit C-Mn (GLUCOSAMINE CHONDR 1500 COMPLX PO) Take 1,500 mg by mouth daily.    . Ibuprofen 200 MG CAPS Take by mouth 3 (three) times daily.     Marland Kitchen levothyroxine (SYNTHROID, LEVOTHROID) 50 MCG tablet TAKE 1 TABLET BY MOUTH EVERY DAY 90 tablet 1  . magnesium oxide (MAG-OX) 400 MG tablet Take 400 mg by mouth daily.    . Multiple Vitamin (MULTIVITAMIN) tablet Take 1 tablet by mouth daily.      . riboflavin (VITAMIN B-2) 100 MG TABS tablet Take 100 mg by mouth 2 (two) times daily.    . rizatriptan (MAXALT-MLT) 5 MG disintegrating tablet Take 1 tablet (5 mg total) by mouth as needed. May repeat in 2 hours if needed 15 tablet 3  . simvastatin (ZOCOR) 40 MG tablet TAKE 1 TABLET BY MOUTH AT BEDTIME 90 tablet 1  . topiramate (TOPAMAX) 50 MG tablet 50mg  am 100mg  pm 90 tablet 6   No facility-administered medications prior to visit.     PAST MEDICAL HISTORY: Past Medical History:  Diagnosis Date  . Depression   . Hyperlipidemia   . Migraines     PAST SURGICAL HISTORY: Past Surgical History:  Procedure Laterality Date  . DILATION AND CURETTAGE OF UTERUS     x2  . laser vein surgery    . TONSILLECTOMY      FAMILY HISTORY: Family History  Problem Relation Age of Onset  . Coronary artery disease Father   . Stroke Father   . Parkinson's disease Father   . Hypertension Mother   . Diabetes Mother   . Stroke Mother   . Coronary artery disease Brother     SOCIAL HISTORY: Social History   Social History  . Marital status: Divorced    Spouse name: N/A  . Number of children: 2  . Years of education: college     Occupational History  . Teacher [employer]    [employer]   Social History Main Topics  . Smoking status: Never Smoker  . Smokeless tobacco: Never Used  . Alcohol use 0.6 oz/week    1 Glasses of wine per week     Comment: wine daily -OCC  . Drug use: No  . Sexual activity: No   Other Topics Concern  . Not on file   Social History Narrative   Divorced, both children live locally.   Patient works part time at [employer]. College education.   Left handed.   Caffeine- two cups coffee daily.     PHYSICAL EXAM  Vitals:   10/16/16 0944  BP: 117/66  Pulse: 69  Weight: 155 lb 9.6 oz (70.6 kg)  Height: 5\' 4"  (1.626 m)   Body mass index is 26.71 kg/m. Generalized: Well developed, in no acute distress  Head: normocephalic and atraumatic,. Oropharynx benign  Neck: Supple, no carotid bruits  Musculoskeletal: No deformity   Neurological examination   Mentation: Alert oriented to time, place, history taking. Attention span and concentration appropriate. Recent and remote memory intact. Follows all commands speech and language fluent.   Cranial nerve II-XII: Pupils were equal round reactive to light extraocular movements were full, visual field were full on confrontational test. Facial sensation and strength were normal. hearing was intact to finger rubbing bilaterally. Uvula tongue midline. head turning and shoulder shrug were normal and symmetric.Tongue protrusion into cheek strength was normal. Motor: normal bulk and tone, full strength in the BUE, BLE, fine finger movements normal, no pronator drift. No focal weakness Sensory: normal and symmetric to light touch, pinprick, and Vibration, proprioception  Coordination: finger-nose-finger, heel-to-shin bilaterally, no dysmetria Reflexes: Symmetric upper and lower, plantar responses were flexor bilaterally. Gait and Station: Rising up from seated position without assistance, normal stance, moderate stride, good arm swing,  smooth turning, able to perform tiptoe, and heel walking without difficulty. Tandem gait is steady.   DIAGNOSTIC DATA (LABS, IMAGING, TESTING) - I reviewed patient records, labs, notes, testing and imaging myself where available.      Component Value Date/Time   NA 142 10/06/2016 0936   K 4.2 10/06/2016 0936   CL 108 10/06/2016 0936   CO2 27 10/06/2016 0936   GLUCOSE 103 (H) 10/06/2016 0936   GLUCOSE 56 02/08/2010   BUN 23 10/06/2016 0936   CREATININE 0.74 10/06/2016 0936   CALCIUM 9.6 10/06/2016 0936   PROT 6.3 10/06/2016 0936   ALBUMIN 4.4 10/06/2016 0936   AST 16 10/06/2016 0936   ALT 24 10/06/2016 0936   ALKPHOS 59 10/06/2016 0936   BILITOT 0.4 10/06/2016 0936   GFRNONAA 90.72 02/15/2010 0849   Lab Results  Component Value Date   CHOL 185 10/06/2016   HDL 53.20 10/06/2016   LDLCALC 112 (H) 10/06/2016   TRIG 99.0 10/06/2016   CHOLHDL 3 10/06/2016    ASSESSMENT AND PLAN  66 y.o. year old female  has a past medical history of Depression; Hyperlipidemia; and Migraines. here to follow up. Her migraines are in good  control with Topamax. She still has one to 2 headaches per week lasting approximately 30 minutes. The intensity of headaches is lessened.  PLAN: Continue   Topamax to 50mg  am, 100mg  pm does not need refills  Continue Maxalt acutely will refill Continue magnesium and riboflavin OTC Call for increase in headaches can use APP migraine tracker to record Avoid headache triggers such as stress Follow-up in 8 months I spent 20 min in total face to face time with the patient more than 50% of which was spent counseling and coordination of care, reviewing test results reviewing medications and discussing and reviewing the diagnosis of migraine,  and further treatment options.. May start to think about titrating down on Topamax dose at her next visit.  Dennie Bible, [facility], [facility], APRN  [facility] [address], [place], [place]  [address] [phone]

## 2016-10-16 NOTE — Patient Instructions (Signed)
Continue   Topamax to 50mg  am, 100mg  pm does not need refills  Continue Maxalt acutely will refill Continue magnesium and riboflavin OTC Call for increase in headaches can use APP migraine tracker to record Avoid headache triggers such as stress Follow-up in 8 months

## 2016-10-20 ENCOUNTER — Encounter: Payer: Self-pay | Admitting: General Practice

## 2016-10-29 ENCOUNTER — Other Ambulatory Visit: Payer: Self-pay | Admitting: Family Medicine

## 2016-12-04 ENCOUNTER — Other Ambulatory Visit: Payer: Self-pay | Admitting: Family Medicine

## 2016-12-19 ENCOUNTER — Other Ambulatory Visit: Payer: Self-pay | Admitting: *Deleted

## 2016-12-19 MED ORDER — TOPIRAMATE 50 MG PO TABS: ORAL_TABLET | ORAL | 4 refills | Status: DC

## 2017-04-05 ENCOUNTER — Ambulatory Visit: Payer: Medicare Other | Admitting: Family Medicine

## 2017-04-13 ENCOUNTER — Encounter: Payer: Self-pay | Admitting: Family Medicine

## 2017-04-13 ENCOUNTER — Encounter: Payer: Self-pay | Admitting: General Practice

## 2017-04-13 ENCOUNTER — Ambulatory Visit (INDEPENDENT_AMBULATORY_CARE_PROVIDER_SITE_OTHER): Payer: Medicare Other | Admitting: Family Medicine

## 2017-04-13 VITALS — BP 118/70 | HR 18 | Temp 98.1°F | Resp 16 | Ht 64.0 in | Wt 155.4 lb

## 2017-04-13 DIAGNOSIS — E038 Other specified hypothyroidism: Secondary | ICD-10-CM

## 2017-04-13 DIAGNOSIS — E785 Hyperlipidemia, unspecified: Secondary | ICD-10-CM | POA: Diagnosis not present

## 2017-04-13 DIAGNOSIS — Z23 Encounter for immunization: Secondary | ICD-10-CM | POA: Diagnosis not present

## 2017-04-13 LAB — HEPATIC FUNCTION PANEL
ALT: 18 U/L (ref 0–35)
AST: 15 U/L (ref 0–37)
Albumin: 4.1 g/dL (ref 3.5–5.2)
Alkaline Phosphatase: 48 U/L (ref 39–117)
Bilirubin, Direct: 0.1 mg/dL (ref 0.0–0.3)
Total Bilirubin: 0.4 mg/dL (ref 0.2–1.2)
Total Protein: 6 g/dL (ref 6.0–8.3)

## 2017-04-13 LAB — LIPID PANEL
Cholesterol: 186 mg/dL (ref 0–200)
HDL: 41.8 mg/dL (ref >39.00)
LDL Cholesterol: 121 mg/dL — ABNORMAL HIGH (ref 0–99)
NonHDL: 143.83
Total CHOL/HDL Ratio: 4
Triglycerides: 115 mg/dL (ref 0.0–149.0)
VLDL: 23 mg/dL (ref 0.0–40.0)

## 2017-04-13 LAB — TSH: TSH: 2.78 u[IU]/mL (ref 0.35–4.50)

## 2017-04-13 NOTE — Assessment & Plan Note (Signed)
Chronic problem.  Currently asymptomatic.  Check labs.  No anticipated med changes. 

## 2017-04-13 NOTE — Assessment & Plan Note (Signed)
Chronic problem.  Tolerating statin w/o difficulty.  Check labs.  Adjust meds prn  

## 2017-04-13 NOTE — Patient Instructions (Signed)
Schedule your complete physical and Medicare Wellness Visit w/ Maudie Mercury in 6 months We'll notify you of your lab results and make any changes if needed Continue to work on healthy diet and regular exercise- you look great! Call with any questions or concerns Happy Labor Day!

## 2017-04-13 NOTE — Progress Notes (Signed)
   Subjective:    Patient ID: Amy Perkins, female    DOB: 1950/12/25, 66 y.o.   MRN: [id number]  HPI Hyperlipidemia- chronic problem, on Simvastatin daily.  Pt is exercising daily- walking 3x/day and going to gym 3x/week.  No CP, SOB, HAs, visual changes, edema, abd pain, N/V.  Hypothyroid- chronic problem, on Levothyroxine.  No excessive fatigue, no changes to skin/hair/nails.   Review of Systems For ROS see HPI     Objective:   Physical Exam  Constitutional: She is oriented to person, place, and time. She appears well-developed and well-nourished. No distress.  HENT:  Head: Normocephalic and atraumatic.  Eyes: Pupils are equal, round, and reactive to light. Conjunctivae and EOM are normal.  Neck: Normal range of motion. Neck supple. No thyromegaly present.  Cardiovascular: Normal rate, regular rhythm, normal heart sounds and intact distal pulses.   No murmur heard. Pulmonary/Chest: Effort normal and breath sounds normal. No respiratory distress.  Abdominal: Soft. She exhibits no distension. There is no tenderness.  Musculoskeletal: She exhibits no edema.  Lymphadenopathy:    She has no cervical adenopathy.  Neurological: She is alert and oriented to person, place, and time.  Skin: Skin is warm and dry.  Psychiatric: She has a normal mood and affect. Her behavior is normal.  Vitals reviewed.         Assessment & Plan:

## 2017-04-27 ENCOUNTER — Other Ambulatory Visit: Payer: Self-pay | Admitting: Family Medicine

## 2017-06-02 ENCOUNTER — Other Ambulatory Visit: Payer: Self-pay | Admitting: Family Medicine

## 2017-06-14 NOTE — Progress Notes (Signed)
[facility]  PATIENT: Amy Perkins DOB: [birth year]   REASON FOR VISIT: follow-up for migraine headaches HISTORY FROM:patient    HISTORY OF PRESENT ILLNESS: HISTORY: she has past medical history of migraine , anxiety and hyperlipidemia and depression, she is a retired Education officer, museum. Since January 2014, without clear trigger event, she began to have constant low grade 2 out of 10 throbbing headaches, started at left parietal region, also involve both parietal region, during last months, she also had 2 episodes of her typical migraine, blurry vision, followed by light sensitivity, noise sensitivity, mild nausea, low-grade headache, worsening by movement, relieved by resting, and take ibuprofen.   She denies jaw claudication, no chewing difficulty, no diffuse muscle achy pain   She had 3 episodes of migraine headache since last visit in September 27 2012, related to her sinus infection, relieved by ibuprofen, Tylenol in 30 minutes, while she was taking topiramate 25 mg 2 tablets twice a day, her headache has much improved, only intermittent mild left parietal area throbbing low-grade headaches, no significant side effect  She has a history of migraines that are in good control with Topamax. She has not had to use for Maxalt very much. She has a new complaint today of daytime sleepiness. She snores at night but is not sure if she quits breathing or not. She has a recent injury to her left hand for which she received a steroid injection last month and continues to have pain over the left thumb area. She returns for reevaluation UPDATE 6/8/16CM Amy Perkins, 66 year old female returns for follow-up. She was last seen in the office by Dr. Krista Blue 10/27/2014. She has a history of migraines and her Topamax was increased to 100 mg twice daily at her last visit and she claims this caused more headaches she cut back to a total dose of 150 daily and her headaches are in better control. She  takes Maxalt acutely she also takes magnesium oxide and riboflavin which has been beneficial. She returns for reevaluation UPDATE 04/13/16 CM Amy Perkins, 66 year old female returns for follow-up. She has a history of migraine headaches and is currently on Topamax 50 in the morning and 100 at night with good relief. She has not had to use her Maxalt since last seen. She is also on magnesium oxide and riboflavin. These have been beneficial as well .She knows her migraine triggers and tries to avoid those. She has had no interval medical issues since last seen. She returns for reevaluation and refills. We discussed decreasing her Topamax as she has a  headache every 2 to 3  Months. UPDATE 02/26/2018CM Amy Perkins, 66 year old female returns for follow-up with her history of migraine headaches. She is currently on Topamax, she denies any difficulty focusing problems with her memory or any paresthesias with this medication. She continues to have 1-2 headaches a week however they're much less intense and do not last long,  she denies any nausea or vomiting. She also takes riboflavin and magnesium which she has felt has been beneficial. Her migraine triggers are stress and some weather-related changes. She rarely uses Maxalt as she finds if she takes ibuprofen her headache usually goes away in 30 minutes. She returns for reevaluation  UPDATE 10/26/2018CM Amy Perkins, 66 year old female returns for follow-up with history of migraine headaches. She is currently on Topamax 50 in the morning 100 at night. The intensity of her headaches is much less then previously. She does have occasional paresthesias as a side effect. She  has about 1 headache per week and they do not last long. Maxalt works acutely she does not have any nausea or vomiting with her headaches. Stress is a trigger and also whether changes. She remains on riboflavin and magnesium. She has had no new medical issues. She returns for reevaluation REVIEW OF  SYSTEMS: Full 14 system review of systems performed and notable only for those listed, all others are neg:  Constitutional: neg  Cardiovascular: neg Ear/Nose/Throat: neg  Skin: neg Eyes: neg Respiratory: neg Gastroitestinal: neg  Hematology/Lymphatic: neg  Endocrine: neg Musculoskeletal:neg Allergy/Immunology: neg Neurological: migraine headaches  Psychiatric: anxiety, depression on Celexa Sleep : neg   ALLERGIES: No Known Allergies  HOME MEDICATIONS: Outpatient Medications Prior to Visit  Medication Sig Dispense Refill  . ALPRAZolam (XANAX) 0.5 MG tablet Take 1/2 tablet by mouth 2 times daily , and 1 tablet at bedtime.    Marland Kitchen aspirin 81 MG tablet Take 81 mg by mouth daily.      . Biotin 1000 MCG tablet Take 1,000 mcg by mouth daily.      . Calcium Carbonate (CALCIUM 600) 1500 MG TABS Take by mouth 2 (two) times daily.      . citalopram (CELEXA) 40 MG tablet Take 1 tablet (40 mg total) by mouth daily. 30 tablet 5  . Glucosamine-Chondroit-Vit C-Mn (GLUCOSAMINE CHONDR 1500 COMPLX PO) Take 1,500 mg by mouth daily.    . Ibuprofen 200 MG CAPS Take by mouth 3 (three) times daily.     Marland Kitchen levothyroxine (SYNTHROID, LEVOTHROID) 50 MCG tablet TAKE 1 TABLET (50 MCG TOTAL) BY MOUTH DAILY. 90 tablet 1  . magnesium oxide (MAG-OX) 400 MG tablet Take 400 mg by mouth daily.    . Multiple Vitamin (MULTIVITAMIN) tablet Take 1 tablet by mouth daily.      . riboflavin (VITAMIN B-2) 100 MG TABS tablet Take 100 mg by mouth 2 (two) times daily.    . rizatriptan (MAXALT-MLT) 5 MG disintegrating tablet Take 1 tablet (5 mg total) by mouth as needed. May repeat in 2 hours if needed 15 tablet 3  . simvastatin (ZOCOR) 40 MG tablet TAKE 1 TABLET BY MOUTH AT BEDTIME 90 tablet 1  . topiramate (TOPAMAX) 50 MG tablet 50mg  am 100mg  pm 270 tablet 4   No facility-administered medications prior to visit.     PAST MEDICAL HISTORY: Past Medical History:  Diagnosis Date  . Depression   . Hyperlipidemia   . Migraines      PAST SURGICAL HISTORY: Past Surgical History:  Procedure Laterality Date  . DILATION AND CURETTAGE OF UTERUS     x2  . laser vein surgery    . TONSILLECTOMY      FAMILY HISTORY: Family History  Problem Relation Age of Onset  . Coronary artery disease Father   . Stroke Father   . Parkinson's disease Father   . Hypertension Mother   . Diabetes Mother   . Stroke Mother   . Coronary artery disease Brother     SOCIAL HISTORY: Social History   Social History  . Marital status: Divorced    Spouse name: N/A  . Number of children: 2  . Years of education: college   Occupational History  . Teacher [employer]   Social History Main Topics  . Smoking status: Never Smoker  . Smokeless tobacco: Never Used  . Alcohol use 0.6 oz/week    1 Glasses of wine per week     Comment: wine daily -OCC  .  Drug use: No  . Sexual activity: No   Other Topics Concern  . Not on file   Social History Narrative   Divorced, both children live locally.   Patient works part time at [employer]. College education.   Left handed.   Caffeine- two cups coffee daily.     PHYSICAL EXAM  Vitals:   06/15/17 0944  BP: 116/67  Pulse: 65  Weight: 158 lb 3.2 oz (71.8 kg)  Height: 5\' 4"  (1.626 m)   Body mass index is 27.15 kg/m. Generalized: Well developed, in no acute distress  Head: normocephalic and atraumatic,. Oropharynx benign  Neck: Supple, Musculoskeletal: No deformity   Neurological examination   Mentation: Alert oriented to time, place, history taking. Attention span and concentration appropriate. Recent and remote memory intact. Follows all commands speech and language fluent.   Cranial nerve II-XII: Pupils were equal round reactive to light extraocular movements were full, visual field were full on confrontational test. Facial sensation and strength were normal. hearing was intact to finger rubbing bilaterally. Uvula tongue midline. head turning and  shoulder shrug were normal and symmetric.Tongue protrusion into cheek strength was normal. Motor: normal bulk and tone, full strength in the BUE, BLE, fine finger movements normal, no pronator drift. No focal weakness Sensory: normal and symmetric to light touch, In the upper and lower extremities arecold, here locally in 80s he is Coordination: finger-nose-finger, heel-to-shin bilaterally, no dysmetria Reflexes: Symmetric upper and lower, plantar responses were flexor bilaterally. Gait and Station: Rising up from seated position without assistance, normal stance, moderate stride, good arm swing, smooth turning, able to perform tiptoe, and heel walking without difficulty. Tandem gait is steady.   DIAGNOSTIC DATA (LABS, IMAGING, TESTING) - I reviewed patient records, labs, notes, testing and imaging myself where available.      Component Value Date/Time   NA 142 10/06/2016 0936   K 4.2 10/06/2016 0936   CL 108 10/06/2016 0936   CO2 27 10/06/2016 0936   GLUCOSE 103 (H) 10/06/2016 0936   GLUCOSE 56 02/08/2010   BUN 23 10/06/2016 0936   CREATININE 0.74 10/06/2016 0936   CALCIUM 9.6 10/06/2016 0936   PROT 6.0 04/13/2017 0909   ALBUMIN 4.1 04/13/2017 0909   AST 15 04/13/2017 0909   ALT 18 04/13/2017 0909   ALKPHOS 48 04/13/2017 0909   BILITOT 0.4 04/13/2017 0909   GFRNONAA 90.72 02/15/2010 0849   Lab Results  Component Value Date   CHOL 186 04/13/2017   HDL 41.80 04/13/2017   LDLCALC 121 (H) 04/13/2017   TRIG 115.0 04/13/2017   CHOLHDL 4 04/13/2017    ASSESSMENT AND PLAN  66 y.o. year old female  has a past medical history of Depression; Hyperlipidemia; and Migraines. here to follow up. Her migraines are in good  control with Topamax. She still has one headache per week lasting approximately 30 minutes. The intensity of headaches is lessened.  PLAN: Continue  Topamax to 50mg  am, 100mg  pm does not need refills  Continue Maxalt acutely will refill Continue magnesium and  riboflavin OTC Call for increase in headaches Avoid headache triggers such as stress Follow-up in 1 year I spent 15 min in total face to face time with the patient more than 50% of which was spent counseling and coordination of care, reviewing test results reviewing medications and discussing and reviewing the diagnosis of migraine,  and further treatment options.. May start to think about titrating down on Topamax dose at her next visit.  She did not  want to decrease the dose yet.  Dennie Bible, [facility], [facility], APRN  [facility] [address] [place], [place] [address] [phone]

## 2017-06-15 ENCOUNTER — Encounter: Payer: Self-pay | Admitting: Nurse Practitioner

## 2017-06-15 ENCOUNTER — Ambulatory Visit (INDEPENDENT_AMBULATORY_CARE_PROVIDER_SITE_OTHER): Payer: Medicare Other | Admitting: Nurse Practitioner

## 2017-06-15 VITALS — BP 116/67 | HR 65 | Ht 64.0 in | Wt 158.2 lb

## 2017-06-15 DIAGNOSIS — G43001 Migraine without aura, not intractable, with status migrainosus: Secondary | ICD-10-CM | POA: Diagnosis not present

## 2017-06-15 MED ORDER — RIZATRIPTAN BENZOATE 5 MG PO TBDP: 5.0000 mg | ORAL_TABLET | ORAL | 6 refills | Status: DC | PRN

## 2017-06-15 NOTE — Patient Instructions (Signed)
Continue  Topamax to 50mg  am, 100mg  pm does not need refills  Continue Maxalt acutely will refill Continue magnesium and riboflavin OTC Call for increase in headaches Avoid headache triggers such as stress Follow-up in 1 year

## 2017-06-15 NOTE — Progress Notes (Signed)
I have reviewed and agreed above plan. 

## 2017-09-20 ENCOUNTER — Other Ambulatory Visit: Payer: Self-pay | Admitting: Dermatology

## 2017-09-20 DIAGNOSIS — IMO0002 Reserved for concepts with insufficient information to code with codable children: Secondary | ICD-10-CM

## 2017-09-20 HISTORY — DX: Reserved for concepts with insufficient information to code with codable children: IMO0002

## 2017-10-09 NOTE — Progress Notes (Addendum)
Subjective:   Amy Perkins is a 67 y.o. female who presents for Medicare Annual (Subsequent) preventive examination.  Review of Systems:  No ROS.  Medicare Wellness Visit. Additional risk factors are reflected in the social history.  Cardiac Risk Factors include: advanced age (>44men, >30 women);dyslipidemia;family history of premature cardiovascular disease   Sleep patterns: Sleeps 6-7 hours.  Home Safety/Smoke Alarms: Feels safe in home. Smoke alarms in place.  Living environment; residence and Firearm Safety: Son lives with pt in 1 story home.  Seat Belt Safety/Bike Helmet: Wears seat belt.   Female:   Pap-UTD, Dr. Helane Rima       Mammo-10/11/2016, Negative. Appt with Dr. Helane Rima in 10/2017.       Dexa scan-10/11/2016, Normal.          CCS-02/18/2010, normal.      Objective:     Vitals: BP 110/60 (BP Location: Left Arm, Patient Position: Sitting, Cuff Size: Normal)   Pulse 62   Ht 5\' 4"  (1.626 m)   Wt 159 lb 9.6 oz (72.4 kg)   SpO2 98%   BMI 27.40 kg/m   Body mass index is 27.4 kg/m.  Advanced Directives 10/10/2017 07/29/2015  Does Patient Have a Medical Advance Directive? Yes Yes  Type of Paramedic of [place];Living will [place];Living will  Copy of [place] in Chart? No - copy requested No - copy requested    Tobacco Social History   Tobacco Use  Smoking Status Never Smoker  Smokeless Tobacco Never Used     Counseling given: Not Answered   Past Medical History:  Diagnosis Date  . Depression   . Hyperlipidemia   . Migraines   . Squamous cell carcinoma 09/20/2017   chest   Past Surgical History:  Procedure Laterality Date  . DILATION AND CURETTAGE OF UTERUS     x2  . laser vein surgery    . TONSILLECTOMY     Family History  Problem Relation Age of Onset  . Coronary artery disease Father   . Stroke Father   . Parkinson's disease Father   . Hypertension Mother   . Diabetes Mother    . Stroke Mother   . Coronary artery disease Brother    Social History   Socioeconomic History  . Marital status: Divorced    Spouse name: None  . Number of children: 2  . Years of education: college  . Highest education level: None  Social Needs  . Financial resource strain: None  . Food insecurity - worry: None  . Food insecurity - inability: None  . Transportation needs - medical: None  . Transportation needs - non-medical: None  Occupational History  . Occupation: Product manager: [employer]    Comment: [employer]  Tobacco Use  . Smoking status: Never Smoker  . Smokeless tobacco: Never Used  Substance and Sexual Activity  . Alcohol use: Yes    Alcohol/week: 0.6 oz    Types: 1 Glasses of wine per week    Comment: 1 glass week  . Drug use: No  . Sexual activity: No  Other Topics Concern  . None  Social History Narrative   Divorced, both children live locally.   Patient works part time at [employer]. College education.   Left handed.   Caffeine- two cups coffee daily.    Outpatient Encounter Medications as of 10/10/2017  Medication Sig  . ALPRAZolam (XANAX) 0.5 MG tablet Take 1/2 tablet by mouth  2 times daily , and 1 tablet at bedtime.  Marland Kitchen aspirin 81 MG tablet Take 81 mg by mouth daily.    . Biotin 1000 MCG tablet Take 1,000 mcg by mouth daily.    . Calcium Carbonate (CALCIUM 600) 1500 MG TABS Take by mouth 2 (two) times daily.    . citalopram (CELEXA) 40 MG tablet Take 1 tablet (40 mg total) by mouth daily.  . Ibuprofen 200 MG CAPS Take by mouth 3 (three) times daily.   Marland Kitchen levothyroxine (SYNTHROID, LEVOTHROID) 50 MCG tablet TAKE 1 TABLET (50 MCG TOTAL) BY MOUTH DAILY.  . rizatriptan (MAXALT-MLT) 5 MG disintegrating tablet Take 1 tablet (5 mg total) by mouth as needed. May repeat in 2 hours if needed  . simvastatin (ZOCOR) 40 MG tablet TAKE 1 TABLET BY MOUTH AT BEDTIME  . topiramate (TOPAMAX) 50 MG tablet 50mg  am 100mg  pm  . Glucosamine-Chondroit-Vit C-Mn  (GLUCOSAMINE CHONDR 1500 COMPLX PO) Take 1,500 mg by mouth daily.  . magnesium oxide (MAG-OX) 400 MG tablet Take 400 mg by mouth daily.  . Multiple Vitamin (MULTIVITAMIN) tablet Take 1 tablet by mouth daily.    . riboflavin (VITAMIN B-2) 100 MG TABS tablet Take 100 mg by mouth 2 (two) times daily.  Marland Kitchen Zoster Vaccine Adjuvanted [facility]) injection Inject 0.5 mLs into the muscle once for 1 dose.   No facility-administered encounter medications on file as of 10/10/2017.     Activities of Daily Living In your present state of health, do you have any difficulty performing the following activities: 10/10/2017 04/13/2017  Hearing? N N  Vision? N N  Difficulty concentrating or making decisions? N N  Walking or climbing stairs? N N  Dressing or bathing? N N  Doing errands, shopping? N N  Preparing Food and eating ? N -  Using the Toilet? N -  In the past six months, have you accidently leaked urine? N -  Do you have problems with loss of bowel control? N -  Managing your Medications? N -  Managing your Finances? N -  Housekeeping or managing your Housekeeping? N -  Some recent data might be hidden    Patient Care Team: Midge Minium, MD as PCP - General [name], MD as Consulting Physician (Gastroenterology) Marcial Pacas, MD as Consulting Physician (Neurology) Dian Queen, MD as Consulting Physician (Obstetrics and Gynecology) [facility] Luberta Mutter, MD as Consulting Physician (Ophthalmology) Earley Favor (Dentistry) Cindie Crumbly (Dermatology)    Assessment:   This is a routine wellness examination for Amy Perkins.  Exercise Activities and Dietary recommendations Current Exercise Habits: Structured exercise class, Type of exercise: walking;treadmill(elliptical), Time (Minutes): 60, Frequency (Times/Week): 3, Weekly Exercise (Minutes/Week): 180, Exercise limited by: None identified   Diet (meal preparation, eat out, water intake, caffeinated beverages, dairy  products, fruits and vegetables): Drinks milk, orange juice and water.   Eats heart healthy diet with "room for improvement", 3 meals/day.     Goals    . Weight (lb) < 145 lb (65.8 kg)     Lose weight by increasing activity and watching caloric intake.        Fall Risk Fall Risk  10/10/2017 10/06/2016 10/05/2015 09/18/2014 09/15/2013  Falls in the past year? Yes No No No No  Number falls in past yr: 1 - - - -  Injury with Fall? No - - - -  Follow up Falls prevention discussed - - - -    Depression Screen [facility] 2/9 Scores 10/10/2017 04/13/2017 10/06/2016 10/05/2015  PHQ - 2 Score 2 0 1 0  PHQ- 9 Score 3 0 1 -  Exception Documentation - - - Other- indicate reason in comment box  Not completed - - - Mild depression medicated for     Cognitive Function MMSE - Mini Mental State Exam 10/10/2017  Orientation to time 5  Orientation to Place 5  Registration 3  Attention/ Calculation 5  Recall 3  Language- name 2 objects 2  Language- repeat 1  Language- follow 3 step command 3  Language- read & follow direction 1  Write a sentence 1  Copy design 1  Total score 30        Immunization History  Administered Date(s) Administered  . H1N1 09/30/2008  . Influenza Split 06/28/2011  . Influenza Whole 05/06/2010  . Influenza,inj,Quad PF,6+ Mos 07/08/2013, 07/03/2014, 06/05/2016, 04/13/2017  . Influenza-Unspecified 06/22/2015  . Pneumococcal Conjugate-13 04/01/2015  . Pneumococcal Polysaccharide-23 04/03/2016  . Td 05/22/2005  . Tdap 06/28/2011  . Zoster 01/14/2014     Screening Tests Health Maintenance  Topic Date Due  . Hepatitis C Screening  10/10/2018 (Originally 10-Apr-1951)  . MAMMOGRAM  10/11/2018  . COLONOSCOPY  02/19/2020  . TETANUS/TDAP  06/27/2021  . INFLUENZA VACCINE  Completed  . DEXA SCAN  Completed  . PNA vac Low Risk Adult  Completed        Plan:     Shingles vaccine at pharmacy.   Bring a copy of your living will and/or healthcare power of attorney to your  next office visit.  Continue doing brain stimulating activities (puzzles, reading, adult coloring books, staying active) to keep memory sharp.   I have personally reviewed and noted the following in the patient's chart:   . Medical and social history . Use of alcohol, tobacco or illicit drugs  . Current medications and supplements . Functional ability and status . Nutritional status . Physical activity . Advanced directives . List of other physicians . Hospitalizations, surgeries, and ER visits in previous 12 months . Vitals . Screenings to include cognitive, depression, and falls . Referrals and appointments  In addition, I have reviewed and discussed with patient certain preventive protocols, quality metrics, and best practice recommendations. A written personalized care plan for preventive services as well as general preventive health recommendations were provided to patient.     Gerilyn Nestle, RN  10/10/2017   PCP Notes: -Mammogram scheduled with Grewal 10/2017 -PHQ9=3 -F/U with PCP 10/17/17 (CPE)  Reviewed documentation provided by RN and agree w/ above.  Annye Asa, MD

## 2017-10-10 ENCOUNTER — Other Ambulatory Visit: Payer: Self-pay

## 2017-10-10 ENCOUNTER — Ambulatory Visit (INDEPENDENT_AMBULATORY_CARE_PROVIDER_SITE_OTHER): Payer: Medicare Other

## 2017-10-10 VITALS — BP 110/60 | HR 62 | Ht 64.0 in | Wt 159.6 lb

## 2017-10-10 DIAGNOSIS — Z0000 Encounter for general adult medical examination without abnormal findings: Secondary | ICD-10-CM | POA: Diagnosis not present

## 2017-10-10 DIAGNOSIS — Z23 Encounter for immunization: Secondary | ICD-10-CM

## 2017-10-10 MED ORDER — ZOSTER VAC RECOMB ADJUVANTED 50 MCG/0.5ML IM SUSR: 0.5000 mL | Freq: Once | INTRAMUSCULAR | 1 refills | Status: AC

## 2017-10-10 NOTE — Patient Instructions (Addendum)
Shingles vaccine at pharmacy.   Bring a copy of your living will and/or healthcare power of attorney to your next office visit.  Continue doing brain stimulating activities (puzzles, reading, adult coloring books, staying active) to keep memory sharp.   Fall Prevention in the Home Falls can cause injuries. They can happen to people of all ages. There are many things you can do to make your home safe and to help prevent falls. What can I do on the outside of my home?  Regularly fix the edges of walkways and driveways and fix any cracks.  Remove anything that might make you trip as you walk through a door, such as a raised step or threshold.  Trim any bushes or trees on the path to your home.  Use bright outdoor lighting.  Clear any walking paths of anything that might make someone trip, such as rocks or tools.  Regularly check to see if handrails are loose or broken. Make sure that both sides of any steps have handrails.  Any raised decks and porches should have guardrails on the edges.  Have any leaves, snow, or ice cleared regularly.  Use sand or salt on walking paths during winter.  Clean up any spills in your garage right away. This includes oil or grease spills. What can I do in the bathroom?  Use night lights.  Install grab bars by the toilet and in the tub and shower. Do not use towel bars as grab bars.  Use non-skid mats or decals in the tub or shower.  If you need to sit down in the shower, use a plastic, non-slip stool.  Keep the floor dry. Clean up any water that spills on the floor as soon as it happens.  Remove soap buildup in the tub or shower regularly.  Attach bath mats securely with double-sided non-slip rug tape.  Do not have throw rugs and other things on the floor that can make you trip. What can I do in the bedroom?  Use night lights.  Make sure that you have a light by your bed that is easy to reach.  Do not use any sheets or blankets that are  too big for your bed. They should not hang down onto the floor.  Have a firm chair that has side arms. You can use this for support while you get dressed.  Do not have throw rugs and other things on the floor that can make you trip. What can I do in the kitchen?  Clean up any spills right away.  Avoid walking on wet floors.  Keep items that you use a lot in easy-to-reach places.  If you need to reach something above you, use a strong step stool that has a grab bar.  Keep electrical cords out of the way.  Do not use floor polish or wax that makes floors slippery. If you must use wax, use non-skid floor wax.  Do not have throw rugs and other things on the floor that can make you trip. What can I do with my stairs?  Do not leave any items on the stairs.  Make sure that there are handrails on both sides of the stairs and use them. Fix handrails that are broken or loose. Make sure that handrails are as long as the stairways.  Check any carpeting to make sure that it is firmly attached to the stairs. Fix any carpet that is loose or worn.  Avoid having throw rugs at the top or   or bottom of the stairs. If you do have throw rugs, attach them to the floor with carpet tape.  Make sure that you have a light switch at the top of the stairs and the bottom of the stairs. If you do not have them, ask someone to add them for you. What else can I do to help prevent falls?  Wear shoes that: ? Do not have high heels. ? Have rubber bottoms. ? Are comfortable and fit you well. ? Are closed at the toe. Do not wear sandals.  If you use a stepladder: ? Make sure that it is fully opened. Do not climb a closed stepladder. ? Make sure that both sides of the stepladder are locked into place. ? Ask someone to hold it for you, if possible.  Clearly mark and make sure that you can see: ? Any grab bars or handrails. ? First and last steps. ? Where the edge of each step is.  Use tools that help you  move around (mobility aids) if they are needed. These include: ? Canes. ? Walkers. ? Scooters. ? Crutches.  Turn on the lights when you go into a dark area. Replace any light bulbs as soon as they burn out.  Set up your furniture so you have a clear path. Avoid moving your furniture around.  If any of your floors are uneven, fix them.  If there are any pets around you, be aware of where they are.  Review your medicines with your doctor. Some medicines can make you feel dizzy. This can increase your chance of falling. Ask your doctor what other things that you can do to help prevent falls. This information is not intended to replace advice given to you by your health care provider. Make sure you discuss any questions you have with your health care provider. Document Released: 06/03/2009 Document Revised: 01/13/2016 Document Reviewed: 09/11/2014 Elsevier Interactive Patient Education  2018 [place] Maintenance, Female Adopting a healthy lifestyle and getting preventive care can go a long way to promote health and wellness. Talk with your health care provider about what schedule of regular examinations is right for you. This is a good chance for you to check in with your provider about disease prevention and staying healthy. In between checkups, there are plenty of things you can do on your own. Experts have done a lot of research about which lifestyle changes and preventive measures are most likely to keep you healthy. Ask your health care provider for more information. Weight and diet Eat a healthy diet  Be sure to include plenty of vegetables, fruits, low-fat dairy products, and lean protein.  Do not eat a lot of foods high in solid fats, added sugars, or salt.  Get regular exercise. This is one of the most important things you can do for your health. ? Most adults should exercise for at least 150 minutes each week. The exercise should increase your heart rate and make  you sweat (moderate-intensity exercise). ? Most adults should also do strengthening exercises at least twice a week. This is in addition to the moderate-intensity exercise.  Maintain a healthy weight  Body mass index (BMI) is a measurement that can be used to identify possible weight problems. It estimates body fat based on height and weight. Your health care provider can help determine your BMI and help you achieve or maintain a healthy weight.  For females 90 years of age and older: ? A BMI below 18.5 is  considered underweight. ? A BMI of 18.5 to 24.9 is normal. ? A BMI of 25 to 29.9 is considered overweight. ? A BMI of 30 and above is considered obese.  Watch levels of cholesterol and blood lipids  You should start having your blood tested for lipids and cholesterol at 68 years of age, then have this test every 5 years.  You may need to have your cholesterol levels checked more often if: ? Your lipid or cholesterol levels are high. ? You are older than 68 years of age. ? You are at high risk for heart disease.  Cancer screening Lung Cancer  Lung cancer screening is recommended for adults 32-16 years old who are at high risk for lung cancer because of a history of smoking.  A yearly low-dose CT scan of the lungs is recommended for people who: ? Currently smoke. ? Have quit within the past 15 years. ? Have at least a 30-pack-year history of smoking. A pack year is smoking an average of one pack of cigarettes a day for 1 year.  Yearly screening should continue until it has been 15 years since you quit.  Yearly screening should stop if you develop a health problem that would prevent you from having lung cancer treatment.  Breast Cancer  Practice breast self-awareness. This means understanding how your breasts normally appear and feel.  It also means doing regular breast self-exams. Let your health care provider know about any changes, no matter how small.  If you are in your  20s or 30s, you should have a clinical breast exam (CBE) by a health care provider every 1-3 years as part of a regular health exam.  If you are 25 or older, have a CBE every year. Also consider having a breast X-ray (mammogram) every year.  If you have a family history of breast cancer, talk to your health care provider about genetic screening.  If you are at high risk for breast cancer, talk to your health care provider about having an MRI and a mammogram every year.  Breast cancer gene (BRCA) assessment is recommended for women who have family members with BRCA-related cancers. BRCA-related cancers include: ? Breast. ? Ovarian. ? Tubal. ? Peritoneal cancers.  Results of the assessment will determine the need for genetic counseling and BRCA1 and BRCA2 testing.  Cervical Cancer Your health care provider may recommend that you be screened regularly for cancer of the pelvic organs (ovaries, uterus, and vagina). This screening involves a pelvic examination, including checking for microscopic changes to the surface of your cervix (Pap test). You may be encouraged to have this screening done every 3 years, beginning at age 50.  For women ages 9-65, health care providers may recommend pelvic exams and Pap testing every 3 years, or they may recommend the Pap and pelvic exam, combined with testing for human papilloma virus (HPV), every 5 years. Some types of HPV increase your risk of cervical cancer. Testing for HPV may also be done on women of any age with unclear Pap test results.  Other health care providers may not recommend any screening for nonpregnant women who are considered low risk for pelvic cancer and who do not have symptoms. Ask your health care provider if a screening pelvic exam is right for you.  If you have had past treatment for cervical cancer or a condition that could lead to cancer, you need Pap tests and screening for cancer for at least 20 years after your treatment. If Pap  tests have been discontinued, your risk factors (such as having a new sexual partner) need to be reassessed to determine if screening should resume. Some women have medical problems that increase the chance of getting cervical cancer. In these cases, your health care provider may recommend more frequent screening and Pap tests.  Colorectal Cancer  This type of cancer can be detected and often prevented.  Routine colorectal cancer screening usually begins at 68 years of age and continues through 68 years of age.  Your health care provider may recommend screening at an earlier age if you have risk factors for colon cancer.  Your health care provider may also recommend using home test kits to check for hidden blood in the stool.  A small camera at the end of a tube can be used to examine your colon directly (sigmoidoscopy or colonoscopy). This is done to check for the earliest forms of colorectal cancer.  Routine screening usually begins at age 65.  Direct examination of the colon should be repeated every 5-10 years through 68 years of age. However, you may need to be screened more often if early forms of precancerous polyps or small growths are found.  Skin Cancer  Check your skin from head to toe regularly.  Tell your health care provider about any new moles or changes in moles, especially if there is a change in a mole's shape or color.  Also tell your health care provider if you have a mole that is larger than the size of a pencil eraser.  Always use sunscreen. Apply sunscreen liberally and repeatedly throughout the day.  Protect yourself by wearing long sleeves, pants, a wide-brimmed hat, and sunglasses whenever you are outside.  Heart disease, diabetes, and high blood pressure  High blood pressure causes heart disease and increases the risk of stroke. High blood pressure is more likely to develop in: ? People who have blood pressure in the high end of the normal range  (130-139/85-89 mm Hg). ? People who are overweight or obese. ? People who are African American.  If you are 87-11 years of age, have your blood pressure checked every 3-5 years. If you are 85 years of age or older, have your blood pressure checked every year. You should have your blood pressure measured twice-once when you are at a hospital or clinic, and once when you are not at a hospital or clinic. Record the average of the two measurements. To check your blood pressure when you are not at a hospital or clinic, you can use: ? An automated blood pressure machine at a pharmacy. ? A home blood pressure monitor.  If you are between 74 years and 89 years old, ask your health care provider if you should take aspirin to prevent strokes.  Have regular diabetes screenings. This involves taking a blood sample to check your fasting blood sugar level. ? If you are at a normal weight and have a low risk for diabetes, have this test once every three years after 68 years of age. ? If you are overweight and have a high risk for diabetes, consider being tested at a younger age or more often. Preventing infection Hepatitis B  If you have a higher risk for hepatitis B, you should be screened for this virus. You are considered at high risk for hepatitis B if: ? You were born in a country where hepatitis B is common. Ask your health care provider which countries are considered high risk. ? Your parents were  born in a high-risk country, and you have not been immunized against hepatitis B (hepatitis B vaccine). ? You have HIV or AIDS. ? You use needles to inject street drugs. ? You live with someone who has hepatitis B. ? You have had sex with someone who has hepatitis B. ? You get hemodialysis treatment. ? You take certain medicines for conditions, including cancer, organ transplantation, and autoimmune conditions.  Hepatitis C  Blood testing is recommended for: ? Everyone born from [birth year] through  1966. ? Anyone with known risk factors for hepatitis C.  Sexually transmitted infections (STIs)  You should be screened for sexually transmitted infections (STIs) including gonorrhea and chlamydia if: ? You are sexually active and are younger than 68 years of age. ? You are older than 68 years of age and your health care provider tells you that you are at risk for this type of infection. ? Your sexual activity has changed since you were last screened and you are at an increased risk for chlamydia or gonorrhea. Ask your health care provider if you are at risk.  If you do not have HIV, but are at risk, it may be recommended that you take a prescription medicine daily to prevent HIV infection. This is called pre-exposure prophylaxis (PrEP). You are considered at risk if: ? You are sexually active and do not regularly use condoms or know the HIV status of your partner(s). ? You take drugs by injection. ? You are sexually active with a partner who has HIV.  Talk with your health care provider about whether you are at high risk of being infected with HIV. If you choose to begin PrEP, you should first be tested for HIV. You should then be tested every 3 months for as long as you are taking PrEP. Pregnancy  If you are premenopausal and you may become pregnant, ask your health care provider about preconception counseling.  If you may become pregnant, take 400 to 800 micrograms (mcg) of folic acid every day.  If you want to prevent pregnancy, talk to your health care provider about birth control (contraception). Osteoporosis and menopause  Osteoporosis is a disease in which the bones lose minerals and strength with aging. This can result in serious bone fractures. Your risk for osteoporosis can be identified using a bone density scan.  If you are 77 years of age or older, or if you are at risk for osteoporosis and fractures, ask your health care provider if you should be screened.  Ask your health  care provider whether you should take a calcium or vitamin D supplement to lower your risk for osteoporosis.  Menopause may have certain physical symptoms and risks.  Hormone replacement therapy may reduce some of these symptoms and risks. Talk to your health care provider about whether hormone replacement therapy is right for you. Follow these instructions at home:  Schedule regular health, dental, and eye exams.  Stay current with your immunizations.  Do not use any tobacco products including cigarettes, chewing tobacco, or electronic cigarettes.  If you are pregnant, do not drink alcohol.  If you are breastfeeding, limit how much and how often you drink alcohol.  Limit alcohol intake to no more than 1 drink per day for nonpregnant women. One drink equals 12 ounces of beer, 5 ounces of wine, or 1 ounces of hard liquor.  Do not use street drugs.  Do not share needles.  Ask your health care provider for help if you need support or  information about quitting drugs.  Tell your health care provider if you often feel depressed.  Tell your health care provider if you have ever been abused or do not feel safe at home. This information is not intended to replace advice given to you by your health care provider. Make sure you discuss any questions you have with your health care provider. Document Released: 02/20/2011 Document Revised: 01/13/2016 Document Reviewed: 05/11/2015 Elsevier Interactive Patient Education  [employer].

## 2017-10-17 ENCOUNTER — Ambulatory Visit (INDEPENDENT_AMBULATORY_CARE_PROVIDER_SITE_OTHER): Payer: Medicare Other | Admitting: Family Medicine

## 2017-10-17 ENCOUNTER — Other Ambulatory Visit: Payer: Self-pay

## 2017-10-17 ENCOUNTER — Encounter: Payer: Self-pay | Admitting: Family Medicine

## 2017-10-17 VITALS — BP 112/62 | HR 32 | Temp 98.0°F | Resp 16 | Ht 64.0 in | Wt 158.1 lb

## 2017-10-17 DIAGNOSIS — E785 Hyperlipidemia, unspecified: Secondary | ICD-10-CM | POA: Diagnosis not present

## 2017-10-17 DIAGNOSIS — Z0000 Encounter for general adult medical examination without abnormal findings: Secondary | ICD-10-CM

## 2017-10-17 DIAGNOSIS — H6122 Impacted cerumen, left ear: Secondary | ICD-10-CM | POA: Diagnosis not present

## 2017-10-17 DIAGNOSIS — E038 Other specified hypothyroidism: Secondary | ICD-10-CM

## 2017-10-17 LAB — CBC WITH DIFFERENTIAL/PLATELET
Basophils Absolute: 0 10*3/uL (ref 0.0–0.1)
Basophils Relative: 0.7 % (ref 0.0–3.0)
Eosinophils Absolute: 0.2 10*3/uL (ref 0.0–0.7)
Eosinophils Relative: 4.3 % (ref 0.0–5.0)
HCT: 45.3 % (ref 36.0–46.0)
Hemoglobin: 15.4 g/dL — ABNORMAL HIGH (ref 12.0–15.0)
Lymphocytes Relative: 33 % (ref 12.0–46.0)
Lymphs Abs: 1.9 10*3/uL (ref 0.7–4.0)
MCHC: 34.1 g/dL (ref 30.0–36.0)
MCV: 89.7 fl (ref 78.0–100.0)
Monocytes Absolute: 0.3 10*3/uL (ref 0.1–1.0)
Monocytes Relative: 5.8 % (ref 3.0–12.0)
Neutro Abs: 3.2 10*3/uL (ref 1.4–7.7)
Neutrophils Relative %: 56.2 % (ref 43.0–77.0)
Platelets: 215 10*3/uL (ref 150.0–400.0)
RBC: 5.04 Mil/uL (ref 3.87–5.11)
RDW: 13 % (ref 11.5–15.5)
WBC: 5.6 10*3/uL (ref 4.0–10.5)

## 2017-10-17 LAB — BASIC METABOLIC PANEL
BUN: 20 mg/dL (ref 6–23)
CO2: 29 mEq/L (ref 19–32)
Calcium: 9.9 mg/dL (ref 8.4–10.5)
Chloride: 108 mEq/L (ref 96–112)
Creatinine, Ser: 0.83 mg/dL (ref 0.40–1.20)
GFR: 72.73 mL/min (ref >60.00)
Glucose, Bld: 110 mg/dL — ABNORMAL HIGH (ref 70–99)
Potassium: 4.2 mEq/L (ref 3.5–5.1)
Sodium: 142 mEq/L (ref 135–145)

## 2017-10-17 LAB — LIPID PANEL
Cholesterol: 183 mg/dL (ref 0–200)
HDL: 55.7 mg/dL (ref >39.00)
LDL Cholesterol: 109 mg/dL — ABNORMAL HIGH (ref 0–99)
NonHDL: 127.75
Total CHOL/HDL Ratio: 3
Triglycerides: 95 mg/dL (ref 0.0–149.0)
VLDL: 19 mg/dL (ref 0.0–40.0)

## 2017-10-17 LAB — HEPATIC FUNCTION PANEL
ALT: 24 U/L (ref 0–35)
AST: 15 U/L (ref 0–37)
Albumin: 4.2 g/dL (ref 3.5–5.2)
Alkaline Phosphatase: 55 U/L (ref 39–117)
Bilirubin, Direct: 0.1 mg/dL (ref 0.0–0.3)
Total Bilirubin: 0.4 mg/dL (ref 0.2–1.2)
Total Protein: 6.4 g/dL (ref 6.0–8.3)

## 2017-10-17 LAB — TSH: TSH: 3.1 u[IU]/mL (ref 0.35–4.50)

## 2017-10-17 NOTE — Progress Notes (Signed)
   Subjective:    Patient ID: Amy Perkins, female    DOB: Jan 04, 1951, 67 y.o.   MRN: [id number]  HPI CPE- UTD on pap, mammo, colonoscopy, immunizations.  No concerns today.   Review of Systems Patient reports no vision/ hearing changes, adenopathy,fever, weight change,  persistant/recurrent hoarseness , swallowing issues, chest pain, palpitations, edema, persistant/recurrent cough, hemoptysis, dyspnea (rest/exertional/paroxysmal nocturnal), gastrointestinal bleeding (melena, rectal bleeding), abdominal pain, significant heartburn, bowel changes, GU symptoms (dysuria, hematuria, incontinence), Gyn symptoms (abnormal  bleeding, pain),  syncope, focal weakness, memory loss, numbness & tingling, skin/hair/nail changes, abnormal bruising or bleeding, anxiety, or depression.     Objective:   Physical Exam General Appearance:    Alert, cooperative, no distress, appears stated age  Head:    Normocephalic, without obvious abnormality, atraumatic  Eyes:    PERRL, conjunctiva/corneas clear, EOM's intact, fundi    benign, both eyes  Ears:    L ear canal filled w/ soft cerumen- large amount removed w/ curette, remainder irrigated.  TM WNL.  R TM WNL  Nose:   Nares normal, septum midline, mucosa normal, no drainage    or sinus tenderness  Throat:   Lips, mucosa, and tongue normal; teeth and gums normal  Neck:   Supple, symmetrical, trachea midline, no adenopathy;    Thyroid: no enlargement/tenderness/nodules  Back:     Symmetric, no curvature, ROM normal, no CVA tenderness  Lungs:     Clear to auscultation bilaterally, respirations unlabored  Chest Wall:    No tenderness or deformity   Heart:    Regular rate and rhythm, S1 and S2 normal, no murmur, rub   or gallop  Breast Exam:    Deferred to GYN  Abdomen:     Soft, non-tender, bowel sounds active all four quadrants,    no masses, no organomegaly  Genitalia:    Deferred to GYN  Rectal:    Extremities:   Extremities normal, atraumatic, no  cyanosis or edema  Pulses:   2+ and symmetric all extremities  Skin:   Skin color, texture, turgor normal, no rashes or lesions  Lymph nodes:   Cervical, supraclavicular, and axillary nodes normal  Neurologic:   CNII-XII intact, normal strength, sensation and reflexes    throughout          Assessment & Plan:

## 2017-10-17 NOTE — Patient Instructions (Signed)
Follow up in 6 months to recheck cholesterol We'll notify you of your lab results and make any changes if needed Keep up the good work on healthy diet and regular exercise- you look great!! Call with any questions or concerns [place]!!!  ([place]!)

## 2017-10-18 ENCOUNTER — Other Ambulatory Visit (INDEPENDENT_AMBULATORY_CARE_PROVIDER_SITE_OTHER): Payer: Medicare Other

## 2017-10-18 DIAGNOSIS — R7309 Other abnormal glucose: Secondary | ICD-10-CM

## 2017-10-18 LAB — HEMOGLOBIN A1C: Hgb A1c MFr Bld: 6 % (ref 4.6–6.5)

## 2017-10-26 ENCOUNTER — Telehealth: Payer: Self-pay | Admitting: Family Medicine

## 2017-10-26 NOTE — Telephone Encounter (Signed)
Labs mailed per patient request

## 2017-10-26 NOTE — Telephone Encounter (Signed)
Copied from [place] [phone]. Topic: Quick Communication - See Telephone Encounter >> Oct 26, 2017 11:09 AM Percell Belt A wrote: CRM for notification. See Telephone encounter for:  pt called in and would like her last labs mailed to her.  She stated she normally gets a copy,  verified address  10/26/17.

## 2017-11-01 NOTE — Telephone Encounter (Signed)
Patient states she has not received them. She states she would like to come and pick up the hard copy. Please advise.  [phone]

## 2017-11-01 NOTE — Telephone Encounter (Signed)
LM for patient that labs were mailed out on 10/26/17 - however, it does take a little bit longer coming from the office. If patient wishes to pick up a copy, she may do so.    CRM created so that [facility] could disclose this to patient.

## 2017-11-04 ENCOUNTER — Other Ambulatory Visit: Payer: Self-pay | Admitting: Family Medicine

## 2017-11-24 ENCOUNTER — Other Ambulatory Visit: Payer: Self-pay | Admitting: Family Medicine

## 2017-12-26 ENCOUNTER — Other Ambulatory Visit: Payer: Self-pay | Admitting: Nurse Practitioner

## 2018-02-05 ENCOUNTER — Telehealth: Payer: Self-pay | Admitting: *Deleted

## 2018-02-05 NOTE — Telephone Encounter (Signed)
Received fax from [facility] about pt filling maxalt perscription every month while on preventative.  Pt states she uses prn, and only takes one tab daily if has migraine.  Noted having more migraines recently.  I relayed that overuse can cause rebound headaches.  She veralized understanding and was appreciative of call.

## 2018-04-08 ENCOUNTER — Encounter: Payer: Self-pay | Admitting: General Practice

## 2018-04-08 ENCOUNTER — Other Ambulatory Visit: Payer: Self-pay

## 2018-04-08 ENCOUNTER — Ambulatory Visit: Payer: Medicare Other | Admitting: Family Medicine

## 2018-04-08 ENCOUNTER — Encounter: Payer: Self-pay | Admitting: Family Medicine

## 2018-04-08 VITALS — BP 112/70 | HR 65 | Temp 97.9°F | Resp 16 | Ht 64.0 in | Wt 156.8 lb

## 2018-04-08 DIAGNOSIS — E038 Other specified hypothyroidism: Secondary | ICD-10-CM

## 2018-04-08 DIAGNOSIS — Z23 Encounter for immunization: Secondary | ICD-10-CM | POA: Diagnosis not present

## 2018-04-08 DIAGNOSIS — E785 Hyperlipidemia, unspecified: Secondary | ICD-10-CM

## 2018-04-08 LAB — HEPATIC FUNCTION PANEL
ALT: 19 U/L (ref 0–35)
AST: 14 U/L (ref 0–37)
Albumin: 4.3 g/dL (ref 3.5–5.2)
Alkaline Phosphatase: 55 U/L (ref 39–117)
Bilirubin, Direct: 0.1 mg/dL (ref 0.0–0.3)
Total Bilirubin: 0.5 mg/dL (ref 0.2–1.2)
Total Protein: 6.1 g/dL (ref 6.0–8.3)

## 2018-04-08 LAB — LIPID PANEL
Cholesterol: 167 mg/dL (ref 0–200)
HDL: 46.5 mg/dL (ref >39.00)
LDL Cholesterol: 95 mg/dL (ref 0–99)
NonHDL: 120.42
Total CHOL/HDL Ratio: 4
Triglycerides: 127 mg/dL (ref 0.0–149.0)
VLDL: 25.4 mg/dL (ref 0.0–40.0)

## 2018-04-08 LAB — TSH: TSH: 2.47 u[IU]/mL (ref 0.35–4.50)

## 2018-04-08 NOTE — Assessment & Plan Note (Signed)
Chronic problem.  Currently asymptomatic.  Check labs.  Adjust meds prn  

## 2018-04-08 NOTE — Assessment & Plan Note (Signed)
Chronic problem.  Tolerating statin w/o difficulty.  Encouraged healthy diet and regular exercise.  Check labs.  Adjust meds prn. 

## 2018-04-08 NOTE — Patient Instructions (Signed)
Schedule your complete physical in 6 months We'll notify you of your lab results and make any changes if needed Keep up the good work on healthy diet and regular exercise- you're doing great! Call with any questions or concerns Enjoy the rest of your summer!!! 

## 2018-04-08 NOTE — Progress Notes (Signed)
   Subjective:    Patient ID: Amy Perkins, female    DOB: June 14, 1951, 67 y.o.   MRN: [id number]  HPI Hyperlipidemia- chronic problem, Simvastatin 40mg  daily.  Pt is exercising ~3x/week after a brief hiatus.  No CP, SOB, abd pain, N/V.  Hypothyroid- chronic problem, on Levothyroxine 67mcg daily.  Denies excessive fatigue.  No changes to skin/hair/nails.     Review of Systems For ROS see HPI     Objective:   Physical Exam  Constitutional: She is oriented to person, place, and time. She appears well-developed and well-nourished. No distress.  HENT:  Head: Normocephalic and atraumatic.  Eyes: Pupils are equal, round, and reactive to light. Conjunctivae and EOM are normal.  Neck: Normal range of motion. Neck supple. No thyromegaly present.  Cardiovascular: Normal rate, regular rhythm, normal heart sounds and intact distal pulses.  No murmur heard. Pulmonary/Chest: Effort normal and breath sounds normal. No respiratory distress.  Abdominal: Soft. She exhibits no distension. There is no tenderness.  Musculoskeletal: She exhibits no edema.  Lymphadenopathy:    She has no cervical adenopathy.  Neurological: She is alert and oriented to person, place, and time.  Skin: Skin is warm and dry.  Psychiatric: She has a normal mood and affect. Her behavior is normal.  Vitals reviewed.         Assessment & Plan:

## 2018-04-26 ENCOUNTER — Other Ambulatory Visit: Payer: Self-pay | Admitting: Family Medicine

## 2018-05-23 ENCOUNTER — Other Ambulatory Visit: Payer: Self-pay | Admitting: Family Medicine

## 2018-06-13 NOTE — Progress Notes (Signed)
[facility]  PATIENT: Amy Perkins DOB: [birth year]   REASON FOR VISIT: follow-up for migraine headaches HISTORY FROM:patient    HISTORY OF PRESENT ILLNESS: HISTORY: she has past medical history of migraine , anxiety and hyperlipidemia and depression, she is a retired Education officer, [employer]. Since January 2014, without clear trigger event, she began to have constant low grade 2 out of 10 throbbing headaches, started at left parietal region, also involve both parietal region, during last months, she also had 2 episodes of her typical migraine, blurry vision, followed by light sensitivity, noise sensitivity, mild nausea, low-grade headache, worsening by movement, relieved by resting, and take ibuprofen.   She denies jaw claudication, no chewing difficulty, no diffuse muscle achy pain   She had 3 episodes of migraine headache since last visit in September 27 2012, related to her sinus infection, relieved by ibuprofen, Tylenol in 30 minutes, while she was taking topiramate 25 mg 2 tablets twice a day, her headache has much improved, only intermittent mild left parietal area throbbing low-grade headaches, no significant side effect  She has a history of migraines that are in good control with Topamax. She has not had to use for Maxalt very much. She has a new complaint today of daytime sleepiness. She snores at night but is not sure if she quits breathing or not. She has a recent injury to her left hand for which she received a steroid injection last month and continues to have pain over the left thumb area. She returns for reevaluation UPDATE 6/8/16CM [name], 67 year old female returns for follow-up. She was last seen in the office by Dr. Krista Blue 10/27/2014. She has a history of migraines and her Topamax was increased to 100 mg twice daily at her last visit and she claims this caused more headaches she cut back to a total dose of 150 daily and her headaches are in better control. She  takes Maxalt acutely she also takes magnesium oxide and riboflavin which has been beneficial. She returns for reevaluation UPDATE 04/13/16 CM [name], 67 year old female returns for follow-up. She has a history of migraine headaches and is currently on Topamax 50 in the morning and 100 at night with good relief. She has not had to use her Maxalt since last seen. She is also on magnesium oxide and riboflavin. These have been beneficial as well .She knows her migraine triggers and tries to avoid those. She has had no interval medical issues since last seen. She returns for reevaluation and refills. We discussed decreasing her Topamax as she has a  headache every 2 to 3  Months. UPDATE 02/26/2018CM [name], 67 year old female returns for follow-up with her history of migraine headaches. She is currently on Topamax, she denies any difficulty focusing problems with her memory or any paresthesias with this medication. She continues to have 1-2 headaches a week however they're much less intense and do not last long,  she denies any nausea or vomiting. She also takes riboflavin and magnesium which she has felt has been beneficial. Her migraine triggers are stress and some weather-related changes. She rarely uses Maxalt as she finds if she takes ibuprofen her headache usually goes away in 30 minutes. She returns for reevaluation  UPDATE 10/26/2018CM [name], 67 year old female returns for follow-up with history of migraine headaches. She is currently on Topamax 50 in the morning 100 at night. The intensity of her headaches is much less then previously. She does have occasional paresthesias as a side effect. She  has about 1 headache per week and they do not last long. Maxalt works acutely she does not have any nausea or vomiting with her headaches. Stress is a trigger and also whether changes. She remains on riboflavin and magnesium. She has had no new medical issues. She returns for reevaluation  UPDATE  10/28/2019CM [name], 67 year old female returns for follow-up with history of migraines.  She is currently on Topamax 50 in the morning and 100 at night.  She has stopped her Maxalt as she feels ibuprofen works just as well.  She has 1-2 headaches per month not all are severe.  Stress is a big trigger for her.  She remains on magnesium and riboflavin.  She gets little exercise but does walk the dog.  No new neurologic complaints.  She returns for yearly follow-up. REVIEW OF SYSTEMS: Full 14 system review of systems performed and notable only for those listed, all others are neg:  Constitutional: neg  Cardiovascular: neg Ear/Nose/Throat: neg  Skin: neg Eyes: neg Respiratory: neg Gastroitestinal: neg  Hematology/Lymphatic: neg  Endocrine: neg Musculoskeletal:neg Allergy/Immunology: neg Neurological: migraine headaches maybe 2x month Psychiatric: anxiety, depression on Celexa Sleep : neg   ALLERGIES: No Known Allergies  HOME MEDICATIONS: Outpatient Medications Prior to Visit  Medication Sig Dispense Refill  . ALPRAZolam (XANAX) 0.5 MG tablet Take 1/2 tablet by mouth 2 times daily , and 1 tablet at bedtime.    Marland Kitchen aspirin 81 MG tablet Take 81 mg by mouth daily.      . Biotin 1000 MCG tablet Take 1,000 mcg by mouth daily.      . Calcium Carbonate (CALCIUM 600) 1500 MG TABS Take by mouth 2 (two) times daily.      . citalopram (CELEXA) 40 MG tablet Take 1 tablet (40 mg total) by mouth daily. 30 tablet 5  . Ibuprofen 200 MG CAPS Take by mouth 3 (three) times daily.     Marland Kitchen levothyroxine (SYNTHROID, LEVOTHROID) 50 MCG tablet TAKE 1 TABLET (50 MCG TOTAL) BY MOUTH DAILY. 90 tablet 1  . magnesium oxide (MAG-OX) 400 MG tablet Take 400 mg by mouth daily.    . Multiple Vitamin (MULTIVITAMIN) tablet Take 1 tablet by mouth daily.      . riboflavin (VITAMIN B-2) 100 MG TABS tablet Take 100 mg by mouth 2 (two) times daily.    . rizatriptan (MAXALT-MLT) 5 MG disintegrating tablet Take 1 tablet (5 mg  total) by mouth as needed. May repeat in 2 hours if needed 15 tablet 6  . simvastatin (ZOCOR) 40 MG tablet TAKE 1 TABLET BY MOUTH AT BEDTIME 90 tablet 1  . topiramate (TOPAMAX) 50 MG tablet TAKE 1 TABLET BY MOUTH EVERY MORNING AND TAKE 2 TABLETS EVERY EVENING 270 tablet 3   No facility-administered medications prior to visit.     PAST MEDICAL HISTORY: Past Medical History:  Diagnosis Date  . Depression   . Hyperlipidemia   . Migraines   . Squamous cell carcinoma 09/20/2017   chest    PAST SURGICAL HISTORY: Past Surgical History:  Procedure Laterality Date  . DILATION AND CURETTAGE OF UTERUS     x2  . laser vein surgery    . TONSILLECTOMY      FAMILY HISTORY: Family History  Problem Relation Age of Onset  . Coronary artery disease Father   . Stroke Father   . Parkinson's disease Father   . Hypertension Mother   . Diabetes Mother   . Stroke Mother   . Coronary artery  disease Brother     SOCIAL HISTORY: Social History   Socioeconomic History  . Marital status: Divorced    Spouse name: Not on file  . Number of children: 2  . Years of education: college  . Highest education level: Not on file  Occupational History  . Occupation: Product manager: [employer]    Comment: [employer]  Social Needs  . Financial resource strain: Not on file  . Food insecurity:    Worry: Not on file    Inability: Not on file  . Transportation needs:    Medical: Not on file    Non-medical: Not on file  Tobacco Use  . Smoking status: Never Smoker  . Smokeless tobacco: Never Used  Substance and Sexual Activity  . Alcohol use: Yes    Alcohol/week: 1.0 standard drinks    Types: 1 Glasses of wine per week    Comment: 1 glass week  . Drug use: No  . Sexual activity: Never  Lifestyle  . Physical activity:    Days per week: Not on file    Minutes per session: Not on file  . Stress: Not on file  Relationships  . Social connections:    Talks on phone: Not on file    Gets together:  Not on file    Attends religious service: Not on file    Active member of club or organization: Not on file    Attends meetings of clubs or organizations: Not on file    Relationship status: Not on file  . Intimate partner violence:    Fear of current or ex partner: Not on file    Emotionally abused: Not on file    Physically abused: Not on file    Forced sexual activity: Not on file  Other Topics Concern  . Not on file  Social History Narrative   Divorced, both children live locally.   Patient works part time at [employer]. College education.   Left handed.   Caffeine- two cups coffee daily.     PHYSICAL EXAM  Vitals:   06/17/18 1034  BP: 123/74  Pulse: 66  Weight: 163 lb 3.2 oz (74 kg)  Height: 5\' 3"  (1.6 m)   Body mass index is 28.91 kg/m. Generalized: Well developed, in no acute distress  Head: normocephalic and atraumatic,. Oropharynx benign  Neck: Supple, Musculoskeletal: No deformity   Neurological examination   Mentation: Alert oriented to time, place, history taking. Attention span and concentration appropriate. Recent and remote memory intact. Follows all commands speech and language fluent.   Cranial nerve II-XII: Pupils were equal round reactive to light extraocular movements were full, visual field were full on confrontational test. Facial sensation and strength were normal. hearing was intact to finger rubbing bilaterally. Uvula tongue midline. head turning and shoulder shrug were normal and symmetric.Tongue protrusion into cheek strength was normal. Motor: normal bulk and tone, full strength in the BUE, BLE,  Sensory: normal and symmetric to light touch, In the upper and lower extremities  Coordination: finger-nose-finger, heel-to-shin bilaterally, no dysmetria Reflexes: Symmetric upper and lower, plantar responses were flexor bilaterally. Gait and Station: Rising up from seated position without assistance, normal stance, moderate stride, good  arm swing, smooth turning, able to perform tiptoe, and heel walking without difficulty. Tandem gait is steady.  No assistive device  DIAGNOSTIC DATA (LABS, IMAGING, TESTING) - I reviewed patient records, labs, notes, testing and imaging myself where available.      Component Value Date/Time  NA 142 10/17/2017 0939   K 4.2 10/17/2017 0939   CL 108 10/17/2017 0939   CO2 29 10/17/2017 0939   GLUCOSE 110 (H) 10/17/2017 0939   GLUCOSE 56 02/08/2010   BUN 20 10/17/2017 0939   CREATININE 0.83 10/17/2017 0939   CALCIUM 9.9 10/17/2017 0939   PROT 6.1 04/08/2018 1040   ALBUMIN 4.3 04/08/2018 1040   AST 14 04/08/2018 1040   ALT 19 04/08/2018 1040   ALKPHOS 55 04/08/2018 1040   BILITOT 0.5 04/08/2018 1040   GFRNONAA 90.72 02/15/2010 0849   Lab Results  Component Value Date   CHOL 167 04/08/2018   HDL 46.50 04/08/2018   LDLCALC 95 04/08/2018   TRIG 127.0 04/08/2018   CHOLHDL 4 04/08/2018    ASSESSMENT AND PLAN  67 y.o. year old female  has a past medical history of Depression, Hyperlipidemia, Migraines, and Squamous cell carcinoma (09/20/2017). here to follow up. Her migraines are in good  control with Topamax. She still has one - two headaches per month lasting approximately 30 minutes. The intensity of headaches is lessened.  She no longer takes Maxalt.  PLAN: Continue  Topamax to 50mg  am, 100mg  pm will  refills Continue magnesium and riboflavin OTC Call for increase in headaches Avoid headache triggers such as stress Try to exercise 30 min at least 5 times weekly Follow-up in 1 year I spent 15 min in total face to face time with the patient more than 50% of which was spent counseling and coordination of care, reviewing test results reviewing medications and discussing and reviewing the diagnosis of migraine,  and further treatment options.. May start to think about titrating down on Topamax dose at her next visit.  She did not want to decrease the dose yet.  Dennie Bible,  [facility], [facility], APRN  [facility] [address] [place], [place] [address] [phone]

## 2018-06-17 ENCOUNTER — Ambulatory Visit: Payer: Medicare Other | Admitting: Nurse Practitioner

## 2018-06-17 ENCOUNTER — Encounter: Payer: Self-pay | Admitting: Nurse Practitioner

## 2018-06-17 VITALS — BP 123/74 | HR 66 | Ht 63.0 in | Wt 163.2 lb

## 2018-06-17 DIAGNOSIS — G43009 Migraine without aura, not intractable, without status migrainosus: Secondary | ICD-10-CM

## 2018-06-17 MED ORDER — TOPIRAMATE 50 MG PO TABS: ORAL_TABLET | ORAL | 3 refills | Status: DC

## 2018-06-17 NOTE — Progress Notes (Signed)
I have reviewed and agreed above plan. 

## 2018-06-17 NOTE — Patient Instructions (Signed)
Continue  Topamax to 50mg  am, 100mg  pm will  refills Continue magnesium and riboflavin OTC Call for increase in headaches Avoid headache triggers such as stress Try to exercise 30 min at least 5 times weekly Follow-up in 1 year

## 2018-07-18 ENCOUNTER — Other Ambulatory Visit: Payer: Self-pay

## 2018-07-18 ENCOUNTER — Emergency Department (HOSPITAL_BASED_OUTPATIENT_CLINIC_OR_DEPARTMENT_OTHER): Payer: Medicare Other

## 2018-07-18 ENCOUNTER — Encounter (HOSPITAL_BASED_OUTPATIENT_CLINIC_OR_DEPARTMENT_OTHER): Payer: Self-pay | Admitting: Emergency Medicine

## 2018-07-18 ENCOUNTER — Observation Stay (HOSPITAL_BASED_OUTPATIENT_CLINIC_OR_DEPARTMENT_OTHER)
Admission: EM | Admit: 2018-07-18 | Discharge: 2018-07-19 | Disposition: A | Discharge status: Home / Self Care | Payer: Medicare Other | Attending: Student | Admitting: Student

## 2018-07-18 ENCOUNTER — Observation Stay (HOSPITAL_BASED_OUTPATIENT_CLINIC_OR_DEPARTMENT_OTHER): Payer: Medicare Other

## 2018-07-18 DIAGNOSIS — J90 Pleural effusion, not elsewhere classified: Secondary | ICD-10-CM | POA: Insufficient documentation

## 2018-07-18 DIAGNOSIS — Z823 Family history of stroke: Secondary | ICD-10-CM | POA: Insufficient documentation

## 2018-07-18 DIAGNOSIS — F419 Anxiety disorder, unspecified: Secondary | ICD-10-CM | POA: Diagnosis not present

## 2018-07-18 DIAGNOSIS — F32A Depression, unspecified: Secondary | ICD-10-CM | POA: Diagnosis present

## 2018-07-18 DIAGNOSIS — Z8249 Family history of ischemic heart disease and other diseases of the circulatory system: Secondary | ICD-10-CM | POA: Insufficient documentation

## 2018-07-18 DIAGNOSIS — E039 Hypothyroidism, unspecified: Secondary | ICD-10-CM | POA: Diagnosis present

## 2018-07-18 DIAGNOSIS — Z791 Long term (current) use of non-steroidal anti-inflammatories (NSAID): Secondary | ICD-10-CM | POA: Insufficient documentation

## 2018-07-18 DIAGNOSIS — Z7901 Long term (current) use of anticoagulants: Secondary | ICD-10-CM | POA: Insufficient documentation

## 2018-07-18 DIAGNOSIS — E785 Hyperlipidemia, unspecified: Secondary | ICD-10-CM | POA: Diagnosis not present

## 2018-07-18 DIAGNOSIS — Z7982 Long term (current) use of aspirin: Secondary | ICD-10-CM | POA: Diagnosis not present

## 2018-07-18 DIAGNOSIS — G43909 Migraine, unspecified, not intractable, without status migrainosus: Secondary | ICD-10-CM | POA: Diagnosis not present

## 2018-07-18 DIAGNOSIS — R0602 Shortness of breath: Secondary | ICD-10-CM | POA: Diagnosis not present

## 2018-07-18 DIAGNOSIS — Z832 Family history of diseases of the blood and blood-forming organs and certain disorders involving the immune mechanism: Secondary | ICD-10-CM | POA: Insufficient documentation

## 2018-07-18 DIAGNOSIS — I2699 Other pulmonary embolism without acute cor pulmonale: Principal | ICD-10-CM | POA: Diagnosis present

## 2018-07-18 DIAGNOSIS — Z79899 Other long term (current) drug therapy: Secondary | ICD-10-CM | POA: Diagnosis not present

## 2018-07-18 DIAGNOSIS — Z820 Family history of epilepsy and other diseases of the nervous system: Secondary | ICD-10-CM | POA: Insufficient documentation

## 2018-07-18 DIAGNOSIS — E038 Other specified hypothyroidism: Secondary | ICD-10-CM

## 2018-07-18 DIAGNOSIS — Z85828 Personal history of other malignant neoplasm of skin: Secondary | ICD-10-CM | POA: Insufficient documentation

## 2018-07-18 DIAGNOSIS — F329 Major depressive disorder, single episode, unspecified: Secondary | ICD-10-CM

## 2018-07-18 DIAGNOSIS — G43009 Migraine without aura, not intractable, without status migrainosus: Secondary | ICD-10-CM

## 2018-07-18 LAB — CBC WITH DIFFERENTIAL/PLATELET
Abs Immature Granulocytes: 0.03 10*3/uL (ref 0.00–0.07)
Basophils Absolute: 0 10*3/uL (ref 0.0–0.1)
Basophils Relative: 0 %
Eosinophils Absolute: 0.2 10*3/uL (ref 0.0–0.5)
Eosinophils Relative: 3 %
HCT: 41.2 % (ref 36.0–46.0)
Hemoglobin: 13.8 g/dL (ref 12.0–15.0)
Immature Granulocytes: 0 %
Lymphocytes Relative: 18 %
Lymphs Abs: 1.4 10*3/uL (ref 0.7–4.0)
MCH: 30.1 pg (ref 26.0–34.0)
MCHC: 33.5 g/dL (ref 30.0–36.0)
MCV: 90 fL (ref 80.0–100.0)
Monocytes Absolute: 0.7 10*3/uL (ref 0.1–1.0)
Monocytes Relative: 9 %
Neutro Abs: 5.3 10*3/uL (ref 1.7–7.7)
Neutrophils Relative %: 70 %
Platelets: 194 10*3/uL (ref 150–400)
RBC: 4.58 MIL/uL (ref 3.87–5.11)
RDW: 12.3 % (ref 11.5–15.5)
WBC: 7.6 10*3/uL (ref 4.0–10.5)
nRBC: 0 % (ref 0.0–0.2)

## 2018-07-18 LAB — URINALYSIS, ROUTINE W REFLEX MICROSCOPIC
Bilirubin Urine: NEGATIVE
Glucose, UA: NEGATIVE mg/dL
Hgb urine dipstick: NEGATIVE
Ketones, ur: NEGATIVE mg/dL
Leukocytes, UA: NEGATIVE
Nitrite: NEGATIVE
Protein, ur: NEGATIVE mg/dL
Specific Gravity, Urine: <1.005 — ABNORMAL LOW (ref 1.005–1.030)
pH: 7 (ref 5.0–8.0)

## 2018-07-18 LAB — BASIC METABOLIC PANEL
Anion gap: 5 (ref 5–15)
BUN: 18 mg/dL (ref 8–23)
CO2: 24 mmol/L (ref 22–32)
Calcium: 9.1 mg/dL (ref 8.9–10.3)
Chloride: 113 mmol/L — ABNORMAL HIGH (ref 98–111)
Creatinine, Ser: 0.67 mg/dL (ref 0.44–1.00)
GFR calc Af Amer: >60 mL/min (ref >60)
GFR calc non Af Amer: >60 mL/min (ref >60)
Glucose, Bld: 98 mg/dL (ref 70–99)
Potassium: 3.7 mmol/L (ref 3.5–5.1)
Sodium: 142 mmol/L (ref 135–145)

## 2018-07-18 LAB — D-DIMER, QUANTITATIVE: D-Dimer, Quant: 3.21 ug/mL-FEU — ABNORMAL HIGH (ref 0.00–0.50)

## 2018-07-18 LAB — ECHOCARDIOGRAM COMPLETE
Height: 64 in
Weight: 2368 oz

## 2018-07-18 LAB — HEPARIN LEVEL (UNFRACTIONATED): Heparin Unfractionated: 0.43 IU/mL (ref 0.30–0.70)

## 2018-07-18 LAB — TROPONIN I: Troponin I: <0.03 ng/mL (ref <0.03)

## 2018-07-18 LAB — ANTITHROMBIN III: AntiThromb III Func: 108 % (ref 75–120)

## 2018-07-18 MED ORDER — TOPIRAMATE 100 MG PO TABS: 100.0000 mg | ORAL_TABLET | Freq: Every day | ORAL | Status: DC

## 2018-07-18 MED ORDER — TOPIRAMATE 100 MG PO TABS
100.0000 mg | ORAL_TABLET | Freq: Every evening | ORAL | Status: DC
  Administered 2018-07-18: 100 mg via ORAL
  Filled 2018-07-18 (×2): qty 1

## 2018-07-18 MED ORDER — TOPIRAMATE 25 MG PO TABS: 50.0000 mg | ORAL_TABLET | Freq: Every day | ORAL | Status: DC

## 2018-07-18 MED ORDER — LEVOTHYROXINE SODIUM 50 MCG PO TABS
50.0000 ug | ORAL_TABLET | Freq: Every day | ORAL | Status: DC
  Administered 2018-07-19: 50 ug via ORAL
  Filled 2018-07-18: qty 1

## 2018-07-18 MED ORDER — SODIUM CHLORIDE 0.9% FLUSH
3.0000 mL | Freq: Two times a day (BID) | INTRAVENOUS | Status: DC
  Administered 2018-07-18 (×2): 3 mL via INTRAVENOUS

## 2018-07-18 MED ORDER — CITALOPRAM HYDROBROMIDE 40 MG PO TABS
40.0000 mg | ORAL_TABLET | Freq: Every day | ORAL | Status: DC
  Administered 2018-07-19: 40 mg via ORAL
  Filled 2018-07-18: qty 1

## 2018-07-18 MED ORDER — ACETAMINOPHEN 325 MG PO TABS
650.0000 mg | ORAL_TABLET | Freq: Four times a day (QID) | ORAL | Status: DC | PRN
  Administered 2018-07-19 (×2): 650 mg via ORAL
  Filled 2018-07-18 (×2): qty 2

## 2018-07-18 MED ORDER — IBUPROFEN 400 MG PO TABS
400.0000 mg | ORAL_TABLET | Freq: Four times a day (QID) | ORAL | Status: DC | PRN
  Administered 2018-07-18: 400 mg via ORAL
  Filled 2018-07-18: qty 1

## 2018-07-18 MED ORDER — FENTANYL CITRATE (PF) 100 MCG/2ML IJ SOLN
50.0000 ug | Freq: Once | INTRAMUSCULAR | Status: AC
  Administered 2018-07-18: 50 ug via INTRAVENOUS

## 2018-07-18 MED ORDER — TOPIRAMATE 25 MG PO TABS
50.0000 mg | ORAL_TABLET | Freq: Every day | ORAL | Status: DC
  Administered 2018-07-19: 50 mg via ORAL
  Filled 2018-07-18: qty 2

## 2018-07-18 MED ORDER — HEPARIN (PORCINE) 25000 UT/250ML-% IV SOLN
1100.0000 [IU]/h | INTRAVENOUS | Status: DC
  Administered 2018-07-18 – 2018-07-19 (×3): 1100 [IU]/h via INTRAVENOUS
  Filled 2018-07-18 (×2): qty 250

## 2018-07-18 MED ORDER — SIMVASTATIN 20 MG PO TABS
40.0000 mg | ORAL_TABLET | Freq: Every day | ORAL | Status: DC
  Administered 2018-07-18: 40 mg via ORAL
  Filled 2018-07-18: qty 2

## 2018-07-18 MED ORDER — HEPARIN BOLUS VIA INFUSION
5000.0000 [IU] | Freq: Once | INTRAVENOUS | Status: AC
  Administered 2018-07-18: 5000 [IU] via INTRAVENOUS

## 2018-07-18 MED ORDER — ONDANSETRON HCL 4 MG/2ML IJ SOLN: 4.0000 mg | Freq: Four times a day (QID) | INTRAMUSCULAR | Status: DC | PRN

## 2018-07-18 MED ORDER — ALPRAZOLAM 0.5 MG PO TABS: 0.5000 mg | ORAL_TABLET | Freq: Three times a day (TID) | ORAL | Status: DC | PRN

## 2018-07-18 MED ORDER — ACETAMINOPHEN 650 MG RE SUPP: 650.0000 mg | Freq: Four times a day (QID) | RECTAL | Status: DC | PRN

## 2018-07-18 MED ORDER — ONDANSETRON HCL 4 MG PO TABS: 4.0000 mg | ORAL_TABLET | Freq: Four times a day (QID) | ORAL | Status: DC | PRN

## 2018-07-18 MED ORDER — FENTANYL CITRATE (PF) 100 MCG/2ML IJ SOLN
INTRAMUSCULAR | Status: AC
  Filled 2018-07-18: qty 2

## 2018-07-18 MED ORDER — IOPAMIDOL (ISOVUE-370) INJECTION 76%
100.0000 mL | Freq: Once | INTRAVENOUS | Status: AC | PRN
  Administered 2018-07-18: 100 mL via INTRAVENOUS

## 2018-07-18 NOTE — ED Triage Notes (Signed)
Woke up at 0445, with SOB and lower back  pain and chest tightness with a deep breath

## 2018-07-18 NOTE — ED Notes (Signed)
ED Provider at bedside. 

## 2018-07-18 NOTE — ED Notes (Signed)
Report given to [facility] RN, receiving nurse for 5C-12 at [facility]

## 2018-07-18 NOTE — Progress Notes (Signed)
ANTICOAGULATION CONSULT NOTE - follow-up Consult  Pharmacy Consult for Heparin Indication: pulmonary embolus  No Known Allergies  Patient Measurements: Height: 5\' 4"  (162.6 cm) Weight: 148 lb (67.1 kg) IBW/kg (Calculated) : 54.7 Heparin Dosing Weight: 67  Vital Signs: Temp: 98.9 F (37.2 C) (11/28 1757) Temp Source: Oral (11/28 1757) BP: 111/62 (11/28 1757) Pulse Rate: 71 (11/28 1757)  Labs: Recent Labs    07/18/18 0859 07/18/18 1927  HGB 13.8  --   HCT 41.2  --   PLT 194  --   HEPARINUNFRC  --  0.43  CREATININE 0.67  --   TROPONINI <0.03  --     Estimated Creatinine Clearance: 63.4 mL/min (by C-G formula based on SCr of 0.67 mg/dL).   Medical History: Past Medical History:  Diagnosis Date  . Depression   . Hyperlipidemia   . Migraines   . Squamous cell carcinoma 09/20/2017   chest     Assessment: Amy Perkins presents with shortness of breath.  CT scan confirms the presence of a PE and pharmacy has been asked to initiate IV heparin.    Heparin level therapeutic at 0.43. No noted bleeding  LABS Reviewed:  D-Dimer elevated 3.2, H/H and platelets are stable and within normal ranges.    HOME Meds Reviewed:  No noted anticoagulant or DOAC.  Has prn Ibuprofen.  Of note - she is on TOPAMAX  Goal of Therapy:  Heparin level 0.3-0.7 units/ml Monitor platelets by anticoagulation protocol: Yes   Plan:  - Continue heparin infusion at 1100 units/hr - Daily heparin level/CBC - F/U plans for transition to oral medication  Isaias Sakai, Pharm D PGY1 Pharmacy Resident  Phone [phone] Please use [facility] for clinical pharmacists numbers  07/18/2018      8:14 PM

## 2018-07-18 NOTE — Progress Notes (Signed)
ANTICOAGULATION CONSULT NOTE - Initial Consult  Pharmacy Consult for Heparin Indication: pulmonary embolus  No Known Allergies  Patient Measurements: Height: 5\' 4"  (162.6 cm) Weight: 148 lb (67.1 kg) IBW/kg (Calculated) : 54.7 Heparin Dosing Weight: 67  Vital Signs: Temp: 98.2 F (36.8 C) (11/28 0849) Temp Source: Oral (11/28 0849) BP: 115/66 (11/28 1100) Pulse Rate: 66 (11/28 1100)  Labs: Recent Labs    07/18/18 0859  HGB 13.8  HCT 41.2  PLT 194  CREATININE 0.67  TROPONINI <0.03    Estimated Creatinine Clearance: 63.4 mL/min (by C-G formula based on SCr of 0.67 mg/dL).   Medical History: Past Medical History:  Diagnosis Date  . Depression   . Hyperlipidemia   . Migraines   . Squamous cell carcinoma 09/20/2017   chest     [name] presents with shortness of breath.  CT scan confirms the presence of a PE and pharmacy has been asked to initiate IV heparin.    LABS Reviewed:  D-Dimer elevated 3.2, H/H and platelets are stable and within normal ranges.  No noted bleeding  HOME Meds Reviewed:  No noted anticoagulant or DOAC.  Has prn Ibuprofen.  Of note - she is on TOPAMAX  Goal of Therapy:  Heparin level 0.3-0.7 units/ml Monitor platelets by anticoagulation protocol: Yes   Plan:  - Heparin 5000 units IV bolus - Heparin infusion at 1100 units/hr - Obtain a heparin level in six hours and adjust as needed - Daily heparin level/CBC - F/U plans for transition to oral medication  Rober Minion, PharmD., MS Clinical Pharmacist Pager:  [phone] Thank you for allowing pharmacy to be part of this patients care team. 07/18/2018,11:07 AM

## 2018-07-18 NOTE — ED Notes (Signed)
Report given to Jeff RN with [facility]  

## 2018-07-18 NOTE — Progress Notes (Signed)
  Echocardiogram 2D Echocardiogram has been performed.  Kenna Kirn T Classie Weng 07/18/2018, 3:18 PM

## 2018-07-18 NOTE — ED Notes (Signed)
Wallet given to daughter, will kept pants, glasses, cell phone and tennis shoes.

## 2018-07-18 NOTE — ED Notes (Signed)
Attempted to given report to receiving nurse, unavailable and will call back

## 2018-07-18 NOTE — ED Provider Notes (Signed)
[facility] EMERGENCY DEPARTMENT Provider Note   CSN: [id number] Arrival date & time: 07/18/18  6010     History   Chief Complaint Chief Complaint  Patient presents with  . Chest Pain    HPI ALFRIEDA TARRY is a 67 y.o. female.  HPI  Patient presents with shortness of breath and some chest tightness.  States she had some low back pain which is not unusual for her.  States however she was feeling more short of breath.  States she took Xanax and it helped some but still feels more short of breath.  Some dull anterior left chest pain also.  Pain in the back is dull.  No swelling in her legs.  No coughing. Past Medical History:  Diagnosis Date  . Depression   . Hyperlipidemia   . Migraines   . Squamous cell carcinoma 09/20/2017   chest    Patient Active Problem List   Diagnosis Date Noted  . Acute pulmonary embolus ([place]) 07/18/2018  . Hypothyroidism 04/03/2016  . Hypoglycemia 06/12/2014  . Migraine   . Occipital neuralgia 09/19/2012  . General medical examination 02/23/2011  . SHINGLES 10/27/2010  . MENISCUS TEAR 05/16/2010  . CERUMEN IMPACTION, LEFT 12/14/2008  . PHLEBITIS&THROMBOPHLEB SUP VESSELS LOWER EXTREM 12/14/2008  . Hyperlipidemia 09/30/2008  . Depression 09/30/2008    Past Surgical History:  Procedure Laterality Date  . DILATION AND CURETTAGE OF UTERUS     x2  . laser vein surgery    . TONSILLECTOMY       OB History    Gravida  2   Para  2   Term      Preterm      AB      Living        SAB      TAB      Ectopic      Multiple      Live Births               Home Medications    Prior to Admission medications   Medication Sig Start Date End Date Taking? Authorizing Provider  ALPRAZolam Duanne Moron) 0.5 MG tablet Take 1/2 tablet by mouth 2 times daily , and 1 tablet at bedtime.   Yes [provider]  aspirin 81 MG tablet Take 81 mg by mouth daily.      [provider]  Biotin 1000 MCG tablet Take 1,000  mcg by mouth daily.      [provider]  Calcium Carbonate (CALCIUM 600) 1500 MG TABS Take by mouth 2 (two) times daily.      [provider]  citalopram (CELEXA) 40 MG tablet Take 1 tablet (40 mg total) by mouth daily. 03/15/12   Midge Minium, MD  Ibuprofen 200 MG CAPS Take by mouth 3 (three) times daily.     [provider]  levothyroxine (SYNTHROID, LEVOTHROID) 50 MCG tablet TAKE 1 TABLET (50 MCG TOTAL) BY MOUTH DAILY. 04/26/18   Midge Minium, MD  magnesium oxide (MAG-OX) 400 MG tablet Take 400 mg by mouth daily.    [provider]  Multiple Vitamin (MULTIVITAMIN) tablet Take 1 tablet by mouth daily.      [provider]  riboflavin (VITAMIN B-2) 100 MG TABS tablet Take 100 mg by mouth 2 (two) times daily.    [provider]  simvastatin (ZOCOR) 40 MG tablet TAKE 1 TABLET BY MOUTH AT BEDTIME 05/23/18   Midge Minium, MD  topiramate (  TOPAMAX) 50 MG tablet TAKE 1 TABLET BY MOUTH EVERY MORNING AND TAKE 2 TABLETS EVERY EVENING 06/17/18   Dennie Bible, NP    Family History Family History  Problem Relation Age of Onset  . Coronary artery disease Father   . Stroke Father   . Parkinson's disease Father   . Hypertension Mother   . Diabetes Mother   . Stroke Mother   . Coronary artery disease Brother     Social History Social History   Tobacco Use  . Smoking status: Never Smoker  . Smokeless tobacco: Never Used  Substance Use Topics  . Alcohol use: Yes    Alcohol/week: 1.0 standard drinks    Types: 1 Glasses of wine per week    Comment: 1 glass week  . Drug use: No     Allergies   Patient has no known allergies.   Review of Systems Review of Systems  Constitutional: Negative for appetite change.  HENT: Negative for congestion.   Respiratory: Positive for shortness of breath.   Cardiovascular: Positive for chest pain.  Gastrointestinal: Negative for abdominal pain.  Genitourinary: Negative for  flank pain.  Musculoskeletal: Positive for back pain.  Neurological: Negative for speech difficulty.  Hematological: Negative for adenopathy.  Psychiatric/Behavioral: The patient is nervous/anxious.      Physical Exam Updated Vital Signs BP 116/69 (BP Location: Left Arm)   Pulse 68   Temp 98 F (36.7 C) (Oral)   Resp 16   Ht 5\' 4"  (1.626 m)   Wt 67.1 kg   SpO2 92%   BMI 25.40 kg/m   Physical Exam  Constitutional: She appears well-developed.  HENT:  Head: Atraumatic.  Neck: Normal range of motion.  Cardiovascular: Normal rate and regular rhythm.  Pulmonary/Chest:  Tenderness to left anterior lower chest wall.  Abdominal: There is no tenderness.  Musculoskeletal:       Right lower leg: She exhibits no edema.       Left lower leg: She exhibits no edema.  No lumbar tenderness.  Neurological: She is alert.  Skin: Skin is warm. Capillary refill takes less than 2 seconds.     ED Treatments / Results  Labs (all labs ordered are listed, but only abnormal results are displayed) Labs Reviewed  URINALYSIS, ROUTINE W REFLEX MICROSCOPIC - Abnormal; Notable for the following components:      Result Value   Specific Gravity, Urine <1.005 (*)    All other components within normal limits  BASIC METABOLIC PANEL - Abnormal; Notable for the following components:   Chloride 113 (*)    All other components within normal limits  D-DIMER, QUANTITATIVE (NOT AT [facility]) - Abnormal; Notable for the following components:   D-Dimer, Quant 3.21 (*)    All other components within normal limits  CBC WITH DIFFERENTIAL/PLATELET  TROPONIN I  ANTITHROMBIN III  PROTEIN C ACTIVITY  PROTEIN C, TOTAL  PROTEIN S ACTIVITY  PROTEIN S, TOTAL  LUPUS ANTICOAGULANT PANEL  BETA-2-GLYCOPROTEIN I ABS, IGG/M/A  HOMOCYSTEINE  FACTOR 5 LEIDEN  PROTHROMBIN GENE MUTATION  CARDIOLIPIN ANTIBODIES, IGG, IGM, IGA  HEPARIN LEVEL (UNFRACTIONATED)    EKG EKG Interpretation  Date/Time:  Thursday July 18 2018 08:50:47 EST Ventricular Rate:  79 PR Interval:    QRS Duration: 107 QT Interval:  390 QTC Calculation: 448 R Axis:   31 Text Interpretation:  Sinus rhythm Confirmed by Davonna Belling [phone]) on 07/18/2018 9:05:01 AM   Radiology Dg Chest 2 View  Result Date: 07/18/2018 CLINICAL DATA:  Mid chest pain and mild shortness of breath today. EXAM: CHEST - 2 VIEW COMPARISON:  None. FINDINGS: Heart size and mediastinal contours are within normal limits. Small ill-defined opacity overlying the LEFT hemidiaphragm, suspicious for LEFT lower lobe pneumonia. Lungs otherwise clear. No pleural effusion or pneumothorax seen. Osseous structures about the chest are unremarkable. IMPRESSION: Ill-defined opacity overlying the LEFT hemidiaphragm, suspicious for LEFT lower lobe pneumonia, alternatively asymmetric edema or atelectasis. Electronically Signed   By: Franki Cabot M.D.   On: 07/18/2018 09:44   Ct Angio Chest Pe W And/or Wo Contrast  Result Date: 07/18/2018 CLINICAL DATA:  Woke with shortness of breath and low back pain, chest tightness with deep breath. PE suspected, intermediate probability, positive D-dimer. EXAM: CT ANGIOGRAPHY CHEST WITH CONTRAST TECHNIQUE: Multidetector CT imaging of the chest was performed using the standard protocol during bolus administration of intravenous contrast. Multiplanar CT image reconstructions and MIPs were obtained to evaluate the vascular anatomy. CONTRAST:  170mL ISOVUE-370 IOPAMIDOL (ISOVUE-370) INJECTION 76% COMPARISON:  None. FINDINGS: Cardiovascular: Moderate burden of pulmonary emboli are present within the RIGHT intralobar pulmonary artery extending into multiple proximal segmental branches to the RIGHT lower lobe. Additional nonocclusive pulmonary emboli within segmental pulmonary artery branches to the RIGHT middle lobe, LEFT upper lobe and lingula. Heart size is normal. No evidence of RIGHT heart failure. No thoracic aortic aneurysm or evidence of aortic  dissection. Mediastinum/Nodes: No mass or enlarged lymph nodes seen within the mediastinum or perihilar regions. Esophagus is unremarkable. Trachea and central bronchi are unremarkable. Lungs/Pleura: Patchy consolidations at the LEFT lung base, likely atelectasis, sequela of pulmonary infarct not excluded. Small LEFT pleural effusion. Mild atelectasis at the RIGHT lung base. Upper Abdomen: Limited images of the upper abdomen are unremarkable. Musculoskeletal: No acute or suspicious osseous finding. Review of the MIP images confirms the above findings. IMPRESSION: 1. Moderate burden of pulmonary emboli within the RIGHT interlobar pulmonary artery extending into multiple proximal segmental pulmonary artery branches to the RIGHT lower lobe. 2. Additional pulmonary emboli within segmental pulmonary artery branches to the RIGHT middle lobe, LEFT upper lobe and lingula, some of which appear occlusive. 3. No evidence of RIGHT heart strain it at this point. 4. Patchy consolidations at the LEFT lung base, suspicious for sequela of pulmonary infarct, alternatively atelectasis. 5. Small LEFT pleural effusion. These results were called by telephone at the time of interpretation on 07/18/2018 at 10:45 am to Dr. Davonna Belling , who verbally acknowledged these results. Electronically Signed   By: Franki Cabot M.D.   On: 07/18/2018 10:46    Procedures Procedures (including critical care time)  Medications Ordered in ED Medications  heparin ADULT infusion 100 units/mL (25000 units/272mL sodium chloride 0.45%) (1,100 Units/hr Intravenous Transfusing/Transfer 07/18/18 1214)  simvastatin (ZOCOR) tablet 40 mg (has no administration in time range)  ALPRAZolam (XANAX) tablet 0.5-1 mg (has no administration in time range)  citalopram (CELEXA) tablet 40 mg (has no administration in time range)  levothyroxine (SYNTHROID, LEVOTHROID) tablet 50 mcg (has no administration in time range)  sodium chloride flush (NS) 0.9 %  injection 3 mL (has no administration in time range)  acetaminophen (TYLENOL) tablet 650 mg (has no administration in time range)    Or  acetaminophen (TYLENOL) suppository 650 mg (has no administration in time range)  ibuprofen (ADVIL,MOTRIN) tablet 400 mg (has no administration in time range)  ondansetron (ZOFRAN) tablet 4 mg (has no administration in time range)    Or  ondansetron (ZOFRAN) injection 4 mg (has no administration  in time range)  topiramate (TOPAMAX) tablet 50 mg (has no administration in time range)    And  topiramate (TOPAMAX) tablet 100 mg (has no administration in time range)  iopamidol (ISOVUE-370) 76 % injection 100 mL (100 mLs Intravenous Contrast Given 07/18/18 1019)  heparin bolus via infusion 5,000 Units (5,000 Units Intravenous Bolus from Bag 07/18/18 1125)  fentaNYL (SUBLIMAZE) injection 50 mcg (50 mcg Intravenous Given 07/18/18 1232)     Initial Impression / Assessment and Plan / ED Course  I have reviewed the triage vital signs and the nursing notes.  Pertinent labs & imaging results that were available during my care of the patient were reviewed by me and considered in my medical decision making (see chart for details).     Patient with acute pulmonary embolism.  Daughter has factor V Leyden and apparently factor II deficiency which I later found out.  Bilateral PEs.  Will admit to hospitalist.  Hypercoagulable panel done here.  Final Clinical Impressions(s) / ED Diagnoses   Final diagnoses:  Acute pulmonary embolism without acute cor pulmonale, unspecified pulmonary embolism type [facility])    ED Discharge Orders    None       Davonna Belling, MD 07/18/18 1525

## 2018-07-18 NOTE — ED Notes (Signed)
Family at bedside. 

## 2018-07-18 NOTE — ED Notes (Signed)
Patient transported to CT 

## 2018-07-18 NOTE — H&P (Signed)
History and Physical   NYARI OLSSON PJK:[id number] DOB: Jan 08, 1951 DOA: 07/18/2018  Referring MD/NP/PA: Dr. Alvino Chapel, [place] PCP: Midge Minium, MD  Patient coming from: Home by way of [facility]  Chief Complaint: Dyspnea, pleuritic chest pain  HPI: Amy Perkins is a 67 y.o. female with a history of HLD, migraine, depression, and anxiety who presented to [facility] ED 11/28 with abrupt onset of dyspnea and chest pain. She awoke with symptoms described as chest tightness when taking a deep breath, not associated with exertion, and shortness of breath at rest which was moderate, constant, and not improved by taking home aspirin, so she drove to the ED. She also had lower back pain which she's had for several weeks attributed to standing for long periods of time at work. She usually walks an hour on the treadmill several times per week and walks her dog every day for exercise without dyspnea.   Evaluation in the ED revealed stable vital signs with tachypnea but no hypoxia. D-dimer elevated and subsequent CTA chest demonstrated bilateral pulmonary emboli.    She has had no leg swelling, tenderness, surgery/immobility, or long distance travel in the past couple weeks (though did fly to and from [place] in October). Her daughter was diagnosed with factor V Leiden as a teenager and has been on eliquis since that time. She's never had major surgery or blood clots or hypercoagulability work up.    Review of Systems: No unusual bleeding or bruising, hematuria, GI bleeding, fever, chills, weight loss, changes in vision or hearing, headache, cough, sore throat, palpitations, abdominal pain, nausea, vomiting, changes in bowel habits, blood in stool, change in bladder habits, myalgias, arthralgias, rash, and per HPI. All others reviewed and are negative.   Past Medical History:  Diagnosis Date  . Depression   . Hyperlipidemia   . Migraines   . Squamous cell carcinoma 09/20/2017   chest   Past Surgical  History:  Procedure Laterality Date  . DILATION AND CURETTAGE OF UTERUS     x2  . laser vein surgery    . 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   No Known Allergies Family History  Problem Relation Age of Onset  . Coronary artery disease Father   . Stroke Father   . Parkinson's disease Father   . Hypertension Mother   . Diabetes Mother   . Stroke Mother   . Coronary artery disease Brother    - Family history otherwise reviewed and not pertinent. Daughter +F5L, no other pertinent history.  Prior to Admission medications   Medication Sig Start Date End Date Taking? Authorizing Provider  ALPRAZolam Duanne Moron) 0.5 MG tablet Take 1/2 tablet by mouth 2 times daily , and 1 tablet at bedtime.   Yes [provider]  aspirin 81 MG tablet Take 81 mg by mouth daily.      [provider]  Biotin 1000 MCG tablet Take 1,000 mcg by mouth daily.      [provider]  Calcium Carbonate (CALCIUM 600) 1500 MG TABS Take by mouth 2 (two) times daily.      [provider]  citalopram (CELEXA) 40 MG tablet Take 1 tablet (40 mg total) by mouth daily. 03/15/12   Midge Minium, MD  Ibuprofen 200 MG CAPS Take by mouth 3 (three) times daily.     [provider]  levothyroxine (SYNTHROID, LEVOTHROID) 50 MCG tablet TAKE 1 TABLET (50 MCG TOTAL) BY MOUTH DAILY. 04/26/18   Midge Minium, MD  magnesium oxide (MAG-OX) 400 MG tablet Take 400 mg by mouth daily.    [provider]  Multiple Vitamin (MULTIVITAMIN) tablet Take 1 tablet by mouth daily.      [provider]  riboflavin (VITAMIN B-2) 100 MG TABS tablet Take 100 mg by mouth 2 (two) times daily.    [provider]  simvastatin (ZOCOR) 40 MG tablet TAKE 1  TABLET BY MOUTH AT BEDTIME 05/23/18   Midge Minium, MD  topiramate (TOPAMAX) 50 MG tablet TAKE 1 TABLET BY MOUTH EVERY MORNING AND TAKE 2 TABLETS EVERY EVENING 06/17/18   Dennie Bible, NP    Physical Exam: Vitals:   07/18/18 1100 07/18/18 1200 07/18/18 1230 07/18/18 1404  BP: 115/66 112/76 118/72 116/69  Pulse: 66 64 65 68  Resp: 15 17 16    Temp:    98 F (36.7 C)  TempSrc:    Oral  SpO2: 98% 96% 95% 92%  Weight:      Height:       Constitutional: 67 y.o. female in no distress, calm demeanor Eyes: Lids and conjunctivae normal, PERRL ENMT: Mucous membranes are moist. Posterior pharynx clear of any exudate or lesions. Fair dentition.  Neck: normal, supple, no masses, no thyromegaly Respiratory: Non-labored breathing 2LPM without accessory muscle use. Clear breath sounds to auscultation bilaterally Cardiovascular: Regular rate and rhythm, no murmurs, rubs, or gallops. No carotid bruits. No JVD. No LE edema. 2+ pedal pulses. Abdomen: Normoactive bowel sounds. No tenderness, non-distended, and no masses palpated. No hepatosplenomegaly. GU: No indwelling catheter Musculoskeletal: No clubbing / cyanosis. No joint deformity upper and lower extremities. Good ROM, no contractures. Normal muscle tone.  Skin: Warm, dry. No rashes, wounds, or ulcers. No significant lesions noted.  Neurologic: CN II-XII grossly intact. Gait not assessed. Speech normal. No focal deficits in motor strength or sensation in all extremities.  Psychiatric: Alert and oriented x3. Normal judgment and insight. Mood anxious with broad affect.   Labs on Admission: I have personally reviewed following labs and imaging studies  CBC: Recent Labs  Lab 07/18/18 0859  WBC 7.6  NEUTROABS 5.3  HGB 13.8  HCT 41.2  MCV 90.0  PLT 287   Basic Metabolic Panel: Recent Labs  Lab 07/18/18 0859  NA 142  K 3.7  CL 113*  CO2 24  GLUCOSE 98  BUN 18  CREATININE 0.67  CALCIUM 9.1   GFR: Estimated Creatinine  Clearance: 63.4 mL/min (by C-G formula based on SCr of 0.67 mg/dL). Liver Function Tests: No results for input(s): AST, ALT, ALKPHOS, BILITOT, PROT, ALBUMIN in the last 168 hours. No results for input(s): LIPASE, AMYLASE in the last 168 hours. No results for input(s): AMMONIA in the last 168 hours. Coagulation Profile: No results for input(s): INR, PROTIME in the last 168 hours. Cardiac Enzymes: Recent Labs  Lab 07/18/18 0859  TROPONINI <0.03   BNP (last 3 results) No results for input(s): PROBNP in the last 8760 hours. HbA1C: No results for input(s): HGBA1C in the last 72 hours. CBG: No results for input(s): GLUCAP in the last 168 hours. Lipid Profile: No results for input(s): CHOL, HDL, LDLCALC, TRIG, CHOLHDL, LDLDIRECT in the last 72 hours. Thyroid Function Tests: No results for input(s): TSH, T4TOTAL, FREET4, T3FREE, THYROIDAB  in the last 72 hours. Anemia Panel: No results for input(s): VITAMINB12, FOLATE, FERRITIN, TIBC, IRON, RETICCTPCT in the last 72 hours. Urine analysis:    Component Value Date/Time   COLORURINE YELLOW 07/18/2018 0912   APPEARANCEUR CLEAR 07/18/2018 0912   LABSPEC <1.005 (L) 07/18/2018 0912   PHURINE 7.0 07/18/2018 0912   GLUCOSEU NEGATIVE 07/18/2018 0912   HGBUR NEGATIVE 07/18/2018 0912   BILIRUBINUR NEGATIVE 07/18/2018 0912   KETONESUR NEGATIVE 07/18/2018 0912   PROTEINUR NEGATIVE 07/18/2018 0912   NITRITE NEGATIVE 07/18/2018 0912   LEUKOCYTESUR NEGATIVE 07/18/2018 0912    No results found for this or any previous visit (from the past 240 hour(s)).   Radiological Exams on Admission: Dg Chest 2 View  Result Date: 07/18/2018 CLINICAL DATA:  Mid chest pain and mild shortness of breath today. EXAM: CHEST - 2 VIEW COMPARISON:  None. FINDINGS: Heart size and mediastinal contours are within normal limits. Small ill-defined opacity overlying the LEFT hemidiaphragm, suspicious for LEFT lower lobe pneumonia. Lungs otherwise clear. No pleural effusion  or pneumothorax seen. Osseous structures about the chest are unremarkable. IMPRESSION: Ill-defined opacity overlying the LEFT hemidiaphragm, suspicious for LEFT lower lobe pneumonia, alternatively asymmetric edema or atelectasis. Electronically Signed   By: Franki Cabot M.D.   On: 07/18/2018 09:44   Ct Angio Chest Pe W And/or Wo Contrast  Result Date: 07/18/2018 CLINICAL DATA:  Woke with shortness of breath and low back pain, chest tightness with deep breath. PE suspected, intermediate probability, positive D-dimer. EXAM: CT ANGIOGRAPHY CHEST WITH CONTRAST TECHNIQUE: Multidetector CT imaging of the chest was performed using the standard protocol during bolus administration of intravenous contrast. Multiplanar CT image reconstructions and MIPs were obtained to evaluate the vascular anatomy. CONTRAST:  180mL ISOVUE-370 IOPAMIDOL (ISOVUE-370) INJECTION 76% COMPARISON:  None. FINDINGS: Cardiovascular: Moderate burden of pulmonary emboli are present within the RIGHT intralobar pulmonary artery extending into multiple proximal segmental branches to the RIGHT lower lobe. Additional nonocclusive pulmonary emboli within segmental pulmonary artery branches to the RIGHT middle lobe, LEFT upper lobe and lingula. Heart size is normal. No evidence of RIGHT heart failure. No thoracic aortic aneurysm or evidence of aortic dissection. Mediastinum/Nodes: No mass or enlarged lymph nodes seen within the mediastinum or perihilar regions. Esophagus is unremarkable. Trachea and central bronchi are unremarkable. Lungs/Pleura: Patchy consolidations at the LEFT lung base, likely atelectasis, sequela of pulmonary infarct not excluded. Small LEFT pleural effusion. Mild atelectasis at the RIGHT lung base. Upper Abdomen: Limited images of the upper abdomen are unremarkable. Musculoskeletal: No acute or suspicious osseous finding. Review of the MIP images confirms the above findings. IMPRESSION: 1. Moderate burden of pulmonary emboli within  the RIGHT interlobar pulmonary artery extending into multiple proximal segmental pulmonary artery branches to the RIGHT lower lobe. 2. Additional pulmonary emboli within segmental pulmonary artery branches to the RIGHT middle lobe, LEFT upper lobe and lingula, some of which appear occlusive. 3. No evidence of RIGHT heart strain it at this point. 4. Patchy consolidations at the LEFT lung base, suspicious for sequela of pulmonary infarct, alternatively atelectasis. 5. Small LEFT pleural effusion. These results were called by telephone at the time of interpretation on 07/18/2018 at 10:45 am to Dr. Davonna Belling , who verbally acknowledged these results. Electronically Signed   By: Franki Cabot M.D.   On: 07/18/2018 10:46    EKG: Independently reviewed. NSR, no ischemic changes or strain.  Assessment/Plan Principal Problem:   Acute pulmonary embolus (HCC) Active Problems:   Hyperlipidemia   Depression  Migraine   Hypothyroidism  Bilateral PE: No CT evidence of RV strain. Per report, had increased dyspnea and currently on 2LPM O2. Will observe on heparin overnight.  - Continue heparin gtt per pharmacy, anticipate DC on DOAC, pt particularly interested in eliquis.  - Echocardiogram ordered, pending. Troponin negative, chest pain resolved. - Hypercoagulability panel sent and pending, including factor V leiden. Suggest hematology follow up based on these results.  - Continue telemetry for now  Hyperlipidemia:  - Continue statin  Migraine: No current sxs.  - Continue topamax and prn's as ordered  Hypothyroidism: No sxs of abnormal function and stable dose synthroid.  - Continue synthroid.   Depression/anxiety: Stable - Continue home SSRI, benzodiazepine.  DVT prophylaxis: Heparin gtt  Code Status: Full  Family Communication: None at bedside Disposition Plan: Home if remains stable 11/29.  Consults called: None  Admission status: Observation    Patrecia Pour, MD [facility] [email or web address] Password [email or web address] 07/18/2018, 2:42 PM

## 2018-07-19 ENCOUNTER — Observation Stay (HOSPITAL_BASED_OUTPATIENT_CLINIC_OR_DEPARTMENT_OTHER): Payer: Medicare Other

## 2018-07-19 DIAGNOSIS — I2699 Other pulmonary embolism without acute cor pulmonale: Secondary | ICD-10-CM | POA: Diagnosis not present

## 2018-07-19 LAB — CBC
HCT: 42.7 % (ref 36.0–46.0)
Hemoglobin: 13.8 g/dL (ref 12.0–15.0)
MCH: 29.1 pg (ref 26.0–34.0)
MCHC: 32.3 g/dL (ref 30.0–36.0)
MCV: 90.1 fL (ref 80.0–100.0)
Platelets: 217 10*3/uL (ref 150–400)
RBC: 4.74 MIL/uL (ref 3.87–5.11)
RDW: 12.3 % (ref 11.5–15.5)
WBC: 7.6 10*3/uL (ref 4.0–10.5)
nRBC: 0 % (ref 0.0–0.2)

## 2018-07-19 LAB — BASIC METABOLIC PANEL
Anion gap: 6 (ref 5–15)
BUN: 13 mg/dL (ref 8–23)
CO2: 24 mmol/L (ref 22–32)
Calcium: 9.2 mg/dL (ref 8.9–10.3)
Chloride: 110 mmol/L (ref 98–111)
Creatinine, Ser: 0.81 mg/dL (ref 0.44–1.00)
GFR calc Af Amer: >60 mL/min (ref >60)
GFR calc non Af Amer: >60 mL/min (ref >60)
Glucose, Bld: 116 mg/dL — ABNORMAL HIGH (ref 70–99)
Potassium: 3.5 mmol/L (ref 3.5–5.1)
Sodium: 140 mmol/L (ref 135–145)

## 2018-07-19 LAB — HEPARIN LEVEL (UNFRACTIONATED): Heparin Unfractionated: 0.52 IU/mL (ref 0.30–0.70)

## 2018-07-19 LAB — HOMOCYSTEINE: Homocysteine: 7.4 umol/L (ref 0.0–15.0)

## 2018-07-19 LAB — HIV ANTIBODY (ROUTINE TESTING W REFLEX): HIV Screen 4th Generation wRfx: NONREACTIVE

## 2018-07-19 MED ORDER — APIXABAN 5 MG PO TABS: 10.0000 mg | ORAL_TABLET | Freq: Two times a day (BID) | ORAL | 0 refills | Status: DC

## 2018-07-19 MED ORDER — APIXABAN 5 MG PO TABS
10.0000 mg | ORAL_TABLET | Freq: Two times a day (BID) | ORAL | Status: DC
  Administered 2018-07-19: 10 mg via ORAL
  Filled 2018-07-19: qty 2

## 2018-07-19 MED ORDER — APIXABAN 5 MG PO TABS: 5.0000 mg | ORAL_TABLET | Freq: Two times a day (BID) | ORAL | 0 refills | Status: DC

## 2018-07-19 MED ORDER — APIXABAN 5 MG PO TABS: 5.0000 mg | ORAL_TABLET | Freq: Two times a day (BID) | ORAL | Status: DC

## 2018-07-19 MED FILL — ELIQUIS STARTER PACK 5 MG T: 5 | 30 days supply | Qty: 74 | Fill #0

## 2018-07-19 NOTE — Care Management (Signed)
Xarelto and Eliquis benefits check sent and pending. CM team will f/u and provide the appropriate DOAC card and discuss est monthly co-pay cost once determined.  Midge Minium RN, BSN, NCM-BC, ACM-RN [phone]

## 2018-07-19 NOTE — Discharge Instructions (Addendum)
It has been a pleasure taking care of you! You were admitted due to shortness of breath and chest pain, which was likely due to blood clots in your lungs. We have treated you with blood thinner. With that your your symptoms improved to the point we think it is safe to let you go home and follow up with your primary care doctor. We are discharging you on blood thinner medication and your home medications that you need to continue taking after you leave the hospital. There could be some changes made to your home medications during this hospitalization. Please, make sure to read the directions before you take them. The names and directions on how to take these medications are found on this discharge paper under medication section.  Please follow up at your primary care doctor's office in 1 week.  Once you are discharged, your primary care physician will handle any further medical issues. Please note that NO REFILLS for any discharge medications will be authorized once you are discharged, as it is imperative that you return to your primary care physician (or establish a relationship with a primary care physician if you do not have one) for your aftercare needs so that they can reassess your need for medications and monitor your lab values. Take care,   Information on my medicine - ELIQUIS (apixaban)  This medication education was reviewed with me or my healthcare representative as part of my discharge preparation.    Why was Eliquis prescribed for you? Eliquis was prescribed to treat blood clots that may have been found in the veins of your legs (deep vein thrombosis) or in your lungs (pulmonary embolism) and to reduce the risk of them occurring again.  What do You need to know about Eliquis ? The starting dose is 10 mg (two 5 mg tablets) taken TWICE daily for the FIRST SEVEN (7) DAYS, then on (enter date)  07/26/18  the dose is reduced to ONE 5 mg tablet taken TWICE daily.  Eliquis may be taken  with or without food.   Try to take the dose about the same time in the morning and in the evening. If you have difficulty swallowing the tablet whole please discuss with your pharmacist how to take the medication safely.  Take Eliquis exactly as prescribed and DO NOT stop taking Eliquis without talking to the doctor who prescribed the medication.  Stopping may increase your risk of developing a new blood clot.  Refill your prescription before you run out.  After discharge, you should have regular check-up appointments with your healthcare provider that is prescribing your Eliquis.    What do you do if you miss a dose? If a dose of ELIQUIS is not taken at the scheduled time, take it as soon as possible on the same day and twice-daily administration should be resumed. The dose should not be doubled to make up for a missed dose.  Important Safety Information A possible side effect of Eliquis is bleeding. You should call your healthcare provider right away if you experience any of the following: ? Bleeding from an injury or your nose that does not stop. ? Unusual colored urine (red or dark brown) or unusual colored stools (red or black). ? Unusual bruising for unknown reasons. ? A serious fall or if you hit your head (even if there is no bleeding).  Some medicines may interact with Eliquis and might increase your risk of bleeding or clotting while on Eliquis. To help avoid this, consult  your healthcare provider or pharmacist prior to using any new prescription or non-prescription medications, including herbals, vitamins, non-steroidal anti-inflammatory drugs (NSAIDs) and supplements.  This website has more information on Eliquis (apixaban): http://[email or web address]

## 2018-07-19 NOTE — Progress Notes (Signed)
Bilateral lower extremity vnous duplex completed - Preliminary results - There is no evidence of a DVT or Baker's cyst Amy Perkins,RVS 07/19/2018 2:00 pm

## 2018-07-19 NOTE — Discharge Summary (Signed)
Physician Discharge Summary  Amy Perkins LPF:[id number] DOB: 10/27/1950 DOA: 07/18/2018  PCP: Midge Minium, MD  Admit date: 07/18/2018 Discharge date: 07/19/2018  Admitted From: Home Disposition: Home  Recommendations for Outpatient Follow-up:  1. Follow up with PCP in 1-2 weeks 2. Please obtain CBC in one week 3. Please follow up on the following pending results: Coagulopathy labs  Home Health: None Equipment/Devices: None  Discharge Condition: Stable CODE STATUS: Full code Diet recommendation: Regular  HPI: Per Dr. Kelby Aline is a 67 y.o. female with a history of HLD, migraine, depression, and anxiety who presented to [facility] ED 11/28 with abrupt onset of dyspnea and chest pain. She awoke with symptoms described as chest tightness when taking a deep breath, not associated with exertion, and shortness of breath at rest which was moderate, constant, and not improved by taking home aspirin, so she drove to the ED. She also had lower back pain which she's had for several weeks attributed to standing for long periods of time at work. She usually walks an hour on the treadmill several times per week and walks her dog every day for exercise without dyspnea.   Evaluation in the ED revealed stable vital signs with tachypnea but no hypoxia. D-dimer elevated and subsequent CTA chest demonstrated bilateral pulmonary emboli.    She has had no leg swelling, tenderness, surgery/immobility, or long distance travel in the past couple weeks (though did fly to and from [place] in October). Her daughter was diagnosed with factor V Leiden as a teenager and has been on eliquis since that time. She's never had major surgery or blood clots or hypercoagulability work up.    Hospital Course: CTA chest demonstrated bilateral pulmonary embolism without right heart strain.  Started on heparin drip.  Echocardiogram with EF of 65 to 70% and G1DD but no other significant structural function  abnormalities.  Hypercoagulable labs drawn and pending at time of discharge.  Venous Doppler negative for DVT in both legs.  Patient was transitioned to oral Eliquis 10 mg twice daily for 7 days followed by 5 mg twice daily.  She was ambulated in the hallway on room air and maintained good oxygen saturation without significant cardiopulmonary symptoms. Discharge home to follow-up with primary care doctor in 1 week.  Aspirin and NSAIDs discontinued on discharge.  Discharge Diagnoses:  Principal Problem:   Acute pulmonary embolus [facility]) Active Problems:   Hyperlipidemia   Depression   Migraine   Hypothyroidism  Discharge Instructions It has been a pleasure taking care of you! You were admitted due to shortness of breath and chest pain, which was likely due to blood clots in your lungs. We have treated you with blood thinner. With that your your symptoms improved to the point we think it is safe to let you go home and follow up with your primary care doctor. We are discharging you on blood thinner medication and your home medications that you need to continue taking after you leave the hospital. There could be some changes made to your home medications during this hospitalization. Please, make sure to read the directions before you take them. The names and directions on how to take these medications are found on this discharge paper under medication section.  Please follow up at your primary care doctor's office in 1 week.  Once you are discharged, your primary care physician will handle any further medical issues. Please note that NO REFILLS for any discharge medications will be authorized  once you are discharged, as it is imperative that you return to your primary care physician (or establish a relationship with a primary care physician if you do not have one) for your aftercare needs so that they can reassess your need for medications and monitor your lab values. Take care,   Information on my  medicine - ELIQUIS (apixaban)  This medication education was reviewed with me or my healthcare representative as part of my discharge preparation.    Why was Eliquis prescribed for you? Eliquis was prescribed to treat blood clots that may have been found in the veins of your legs (deep vein thrombosis) or in your lungs (pulmonary embolism) and to reduce the risk of them occurring again.  What do You need to know about Eliquis ? The starting dose is 10 mg (two 5 mg tablets) taken TWICE daily for the FIRST SEVEN (7) DAYS, then on (enter date)  07/26/18  the dose is reduced to ONE 5 mg tablet taken TWICE daily.  Eliquis may be taken with or without food.   Try to take the dose about the same time in the morning and in the evening. If you have difficulty swallowing the tablet whole please discuss with your pharmacist how to take the medication safely.  Take Eliquis exactly as prescribed and DO NOT stop taking Eliquis without talking to the doctor who prescribed the medication.  Stopping may increase your risk of developing a new blood clot.  Refill your prescription before you run out.  After discharge, you should have regular check-up appointments with your healthcare provider that is prescribing your Eliquis.    What do you do if you miss a dose? If a dose of ELIQUIS is not taken at the scheduled time, take it as soon as possible on the same day and twice-daily administration should be resumed. The dose should not be doubled to make up for a missed dose.  Important Safety Information A possible side effect of Eliquis is bleeding. You should call your healthcare provider right away if you experience any of the following: ? Bleeding from an injury or your nose that does not stop. ? Unusual colored urine (red or dark brown) or unusual colored stools (red or black). ? Unusual bruising for unknown reasons. ? A serious fall or if you hit your head (even if there is no bleeding).  Some  medicines may interact with Eliquis and might increase your risk of bleeding or clotting while on Eliquis. To help avoid this, consult your healthcare provider or pharmacist prior to using any new prescription or non-prescription medications, including herbals, vitamins, non-steroidal anti-inflammatory drugs (NSAIDs) and supplements.  This website has more information on Eliquis (apixaban): [email or web address]   Discharge Instructions    Diet - low sodium heart healthy   Complete by:  As directed    Increase activity slowly   Complete by:  As directed      Allergies as of 07/19/2018   No Known Allergies     Medication List    STOP taking these medications   aspirin 81 MG tablet   ibuprofen 200 MG tablet Commonly known as:  ADVIL,MOTRIN     TAKE these medications   ALPRAZolam 0.5 MG tablet Commonly known as:  XANAX Take 0.5 mg by mouth See admin instructions. Take 1/2 tablet by mouth 2 times daily (morning and 3PM) , and 1 tablet at bedtime.   apixaban 5 MG Tabs tablet Commonly known as:  ELIQUIS Take 2 tablets (10  mg total) by mouth 2 (two) times daily. Started the evening of 07/19/2018   apixaban 5 MG Tabs tablet Commonly known as:  ELIQUIS Take 1 tablet (5 mg total) by mouth 2 (two) times daily. Start on 07/26/2018 Start taking on:  07/26/2018   citalopram 40 MG tablet Commonly known as:  CELEXA Take 1 tablet (40 mg total) by mouth daily.   levothyroxine 50 MCG tablet Commonly known as:  SYNTHROID, LEVOTHROID TAKE 1 TABLET (50 MCG TOTAL) BY MOUTH DAILY.   multivitamin tablet Take 1 tablet by mouth every evening.   simvastatin 40 MG tablet Commonly known as:  ZOCOR TAKE 1 TABLET BY MOUTH AT BEDTIME   topiramate 50 MG tablet Commonly known as:  TOPAMAX TAKE 1 TABLET BY MOUTH EVERY MORNING AND TAKE 2 TABLETS EVERY EVENING What changed:    how much to take  how to take this  when to take this      Follow-up Information    Midge Minium, MD Follow up in 1 week(s).   Specialty:  Family Medicine Contact information: [address] [place] [address] [phone]           Consultations:  None  Procedures/Studies:  2D echo  Study Conclusions  - Left ventricle: The cavity size was normal. Wall thickness was   increased in a pattern of mild LVH. Systolic function was   vigorous. The estimated ejection fraction was in the range of 65%   to 70%. Wall motion was normal; there were no regional wall   motion abnormalities. Doppler parameters are consistent with   abnormal left ventricular relaxation (grade 1 diastolic   dysfunction). The E/e&' ratio is <8, suggesting normal LV filling   pressure. - Aortic valve: Sclerosis without stenosis. There was trivial   regurgitation. Valve area (Vmax): 2.78 cm^2. - Mitral valve: Mildly thickened leaflets . There was trivial   regurgitation. - Left atrium: The atrium was normal in size. - Right ventricle: The cavity size was normal. Wall thickness was   normal. Systolic function was normal. - Right atrium: The atrium was normal in size. - Tricuspid valve: There was trivial regurgitation. - Pulmonary arteries: PA peak pressure: 20 mm Hg (S). - Inferior vena cava: The vessel was normal in size. The   respirophasic diameter changes were in the normal range (>= 50%),   consistent with normal central venous pressure.  Impressions:  - LVEF 65-70%, mild LVH, normal wall motion, grade 1 DD, normal LV   filling pressure, aortic valve sclerosis with trivial AI, trivial   MR, and trivial TR, RVSP 20 mmHg, normal IVC. No RV strain.  Dg Chest 2 View  Result Date: 07/18/2018 CLINICAL DATA:  Mid chest pain and mild shortness of breath today. EXAM: CHEST - 2 VIEW COMPARISON:  None. FINDINGS: Heart size and mediastinal contours are within normal limits. Small ill-defined opacity overlying the LEFT hemidiaphragm, suspicious for LEFT lower lobe pneumonia. Lungs  otherwise clear. No pleural effusion or pneumothorax seen. Osseous structures about the chest are unremarkable. IMPRESSION: Ill-defined opacity overlying the LEFT hemidiaphragm, suspicious for LEFT lower lobe pneumonia, alternatively asymmetric edema or atelectasis. Electronically Signed   By: Franki Cabot M.D.   On: 07/18/2018 09:44   Ct Angio Chest Pe W And/or Wo Contrast  Result Date: 07/18/2018 CLINICAL DATA:  Woke with shortness of breath and low back pain, chest tightness with deep breath. PE suspected, intermediate probability, positive D-dimer. EXAM: CT ANGIOGRAPHY CHEST WITH CONTRAST TECHNIQUE:  Multidetector CT imaging of the chest was performed using the standard protocol during bolus administration of intravenous contrast. Multiplanar CT image reconstructions and MIPs were obtained to evaluate the vascular anatomy. CONTRAST:  1110mL ISOVUE-370 IOPAMIDOL (ISOVUE-370) INJECTION 76% COMPARISON:  None. FINDINGS: Cardiovascular: Moderate burden of pulmonary emboli are present within the RIGHT intralobar pulmonary artery extending into multiple proximal segmental branches to the RIGHT lower lobe. Additional nonocclusive pulmonary emboli within segmental pulmonary artery branches to the RIGHT middle lobe, LEFT upper lobe and lingula. Heart size is normal. No evidence of RIGHT heart failure. No thoracic aortic aneurysm or evidence of aortic dissection. Mediastinum/Nodes: No mass or enlarged lymph nodes seen within the mediastinum or perihilar regions. Esophagus is unremarkable. Trachea and central bronchi are unremarkable. Lungs/Pleura: Patchy consolidations at the LEFT lung base, likely atelectasis, sequela of pulmonary infarct not excluded. Small LEFT pleural effusion. Mild atelectasis at the RIGHT lung base. Upper Abdomen: Limited images of the upper abdomen are unremarkable. Musculoskeletal: No acute or suspicious osseous finding. Review of the MIP images confirms the above findings. IMPRESSION: 1.  Moderate burden of pulmonary emboli within the RIGHT interlobar pulmonary artery extending into multiple proximal segmental pulmonary artery branches to the RIGHT lower lobe. 2. Additional pulmonary emboli within segmental pulmonary artery branches to the RIGHT middle lobe, LEFT upper lobe and lingula, some of which appear occlusive. 3. No evidence of RIGHT heart strain it at this point. 4. Patchy consolidations at the LEFT lung base, suspicious for sequela of pulmonary infarct, alternatively atelectasis. 5. Small LEFT pleural effusion. These results were called by telephone at the time of interpretation on 07/18/2018 at 10:45 am to Dr. Davonna Belling , who verbally acknowledged these results. Electronically Signed   By: Franki Cabot M.D.   On: 07/18/2018 10:46     Subjective: No complaints this morning.  Denies chest pain, shortness of breath or palpitation.  Denies leg swelling or pain.  Denies dizziness or cardiopulmonary symptoms with ambulation to the bathroom.  Ambulated in the hallway on room air without symptoms.  Maintain good oxygen saturation.  Discharge Exam: Vitals:   07/19/18 0600 07/19/18 1209  BP: 120/75 103/74  Pulse: (!) 58 70  Resp: 16 18  Temp: 98 F (36.7 C) 98.6 F (37 C)  SpO2: 96% 95%    GENERAL: Appears well. No acute distress.  HEENT: MMM.  Vision and Hearing grossly intact.  NECK: Supple.  No JVD.  LUNGS:  No IWOB. Good air movement. CTAB.  HEART:  RRR. Heart sounds normal.  ABD: Bowel sounds present. Soft. Non tender.  EXT:   no edema bilaterally.  SKIN: no apparent skin lesion.  NEURO: Awake, alert and oriented appropriately.  No gross deficit.  PSYCH: Calm. Normal affect.  The results of significant diagnostics from this hospitalization (including imaging, microbiology, ancillary and laboratory) are listed below for reference.     Microbiology: No results found for this or any previous visit (from the past 240 hour(s)).   Labs: BNP (last 3  results) No results for input(s): BNP in the last 8760 hours. Basic Metabolic Panel: Recent Labs  Lab 07/18/18 0859 07/19/18 0407  NA 142 140  K 3.7 3.5  CL 113* 110  CO2 24 24  GLUCOSE 98 116*  BUN 18 13  CREATININE 0.67 0.81  CALCIUM 9.1 9.2   Liver Function Tests: No results for input(s): AST, ALT, ALKPHOS, BILITOT, PROT, ALBUMIN in the last 168 hours. No results for input(s): LIPASE, AMYLASE in the last 168 hours.  No results for input(s): AMMONIA in the last 168 hours. CBC: Recent Labs  Lab 07/18/18 0859 07/19/18 0407  WBC 7.6 7.6  NEUTROABS 5.3  --   HGB 13.8 13.8  HCT 41.2 42.7  MCV 90.0 90.1  PLT 194 217   Cardiac Enzymes: Recent Labs  Lab 07/18/18 0859  TROPONINI <0.03   BNP: Invalid input(s): POCBNP CBG: No results for input(s): GLUCAP in the last 168 hours. D-Dimer Recent Labs    07/18/18 0859  DDIMER 3.21*   Hgb A1c No results for input(s): HGBA1C in the last 72 hours. Lipid Profile No results for input(s): CHOL, HDL, LDLCALC, TRIG, CHOLHDL, LDLDIRECT in the last 72 hours. Thyroid function studies No results for input(s): TSH, T4TOTAL, T3FREE, THYROIDAB in the last 72 hours.  Invalid input(s): FREET3 Anemia work up No results for input(s): VITAMINB12, FOLATE, FERRITIN, TIBC, IRON, RETICCTPCT in the last 72 hours. Urinalysis    Component Value Date/Time   COLORURINE YELLOW 07/18/2018 0912   APPEARANCEUR CLEAR 07/18/2018 0912   LABSPEC <1.005 (L) 07/18/2018 0912   PHURINE 7.0 07/18/2018 0912   GLUCOSEU NEGATIVE 07/18/2018 0912   HGBUR NEGATIVE 07/18/2018 0912   BILIRUBINUR NEGATIVE 07/18/2018 0912   KETONESUR NEGATIVE 07/18/2018 0912   PROTEINUR NEGATIVE 07/18/2018 0912   NITRITE NEGATIVE 07/18/2018 0912   LEUKOCYTESUR NEGATIVE 07/18/2018 0912   Sepsis Labs Invalid input(s): PROCALCITONIN,  WBC,  LACTICIDVEN   Time coordinating discharge: 30 minutes  SIGNED:  Mercy Riding, MD  [facility] 07/19/2018, 12:39 PM Pager  [phone]  If 7PM-7AM, please contact night-coverage [email or web address] Password [email or web address]

## 2018-07-19 NOTE — Care Management Obs Status (Signed)
[place] NOTIFICATION   Patient Details  Name: Amy Perkins MRN: [id number] Date of Birth: 1951-01-11   Medicare Observation Status Notification Given:  Yes    Midge Minium RN, BSN, NCM-BC, ACM-RN [phone] 07/19/2018, 1:20 PM

## 2018-07-19 NOTE — Progress Notes (Signed)
ANTICOAGULATION CONSULT NOTE - Follow Up Consult  Pharmacy Consult for Heparin Indication: pulmonary embolus  No Known Allergies  Patient Measurements: Height: 5\' 4"  (162.6 cm) Weight: 148 lb (67.1 kg) IBW/kg (Calculated) : 54.7 Heparin Dosing Weight: 67 kg  Vital Signs: Temp: 98 F (36.7 C) (11/29 0600) Temp Source: Oral (11/29 0600) BP: 120/75 (11/29 0600) Pulse Rate: 58 (11/29 0600)  Labs: Recent Labs    07/18/18 0859 07/18/18 1927 07/19/18 0407  HGB 13.8  --  13.8  HCT 41.2  --  42.7  PLT 194  --  217  HEPARINUNFRC  --  0.43 0.52  CREATININE 0.67  --  0.81  TROPONINI <0.03  --   --     Estimated Creatinine Clearance: 62.6 mL/min (by C-G formula based on SCr of 0.81 mg/dL).  Assessment:  67 yr old female continues on IV heparin for bilateral pulmonary emboli.    Heparin level remains therapeutic (0.52) on 1100 units/hr. CBC stable.  Goal of Therapy:  Heparin level 0.3-0.7 units/ml Monitor platelets by anticoagulation protocol: Yes   Plan:   Continue heparin drip at 1100 units/hr  Daily heparin level and CBC while on heparin.  Follow up plans for oral anticoagulation.  Arty Baumgartner, [place] Pager: [phone] or phone: [phone] 07/19/2018,9:21 AM

## 2018-07-20 LAB — PROTEIN C ACTIVITY: Protein C Activity: 104 % (ref 73–180)

## 2018-07-20 LAB — PROTEIN S ACTIVITY: Protein S Activity: 61 % — ABNORMAL LOW (ref 63–140)

## 2018-07-20 LAB — PROTEIN S, TOTAL: Protein S Ag, Total: 110 % (ref 60–150)

## 2018-07-20 LAB — LUPUS ANTICOAGULANT PANEL
DRVVT: 36.3 s (ref 0.0–47.0)
PTT Lupus Anticoagulant: 23.7 s (ref 0.0–51.9)

## 2018-07-21 LAB — BETA-2-GLYCOPROTEIN I ABS, IGG/M/A
Beta-2 Glyco I IgG: <9 GPI IgG units (ref 0–20)
Beta-2-Glycoprotein I IgA: <9 GPI IgA units (ref 0–25)
Beta-2-Glycoprotein I IgM: <9 GPI IgM units (ref 0–32)

## 2018-07-21 LAB — CARDIOLIPIN ANTIBODIES, IGG, IGM, IGA
Anticardiolipin IgA: <9 APL U/mL (ref 0–11)
Anticardiolipin IgG: <9 GPL U/mL (ref 0–14)
Anticardiolipin IgM: <9 MPL U/mL (ref 0–12)

## 2018-07-22 ENCOUNTER — Telehealth: Payer: Self-pay | Admitting: Student

## 2018-07-22 ENCOUNTER — Encounter: Payer: Self-pay | Admitting: Student

## 2018-07-22 ENCOUNTER — Telehealth: Payer: Self-pay

## 2018-07-22 DIAGNOSIS — D6851 Activated protein C resistance: Secondary | ICD-10-CM | POA: Insufficient documentation

## 2018-07-22 LAB — FACTOR 5 LEIDEN

## 2018-07-22 NOTE — Telephone Encounter (Signed)
Transition Care Management Follow-up Telephone Call  Admit date: 07/18/2018 Discharge date: 07/19/2018 Principal Problem:  Acute pulmonary embolus    How have you been since you were released from the hospital? "I'm much better"   Do you understand why you were in the hospital? yes, clots in both my lungs   Do you understand the discharge instructions? yes   Where were you discharged to? Home.    Items Reviewed:  Medications reviewed: yes. Started on Eliquis BID.   Allergies reviewed: yes  Dietary changes reviewed: yes  Referrals reviewed: yes   Functional Questionnaire:   Activities of Daily Living (ADLs):   She states they are independent in the following: ambulation, bathing and hygiene, feeding, continence, grooming, toileting and dressing States they require assistance with the following: None.    Any transportation issues/concerns?: no   Any patient concerns? no   Confirmed importance and date/time of follow-up visits scheduled yes  Provider Appointment booked with PCP 07/29/18.   Confirmed with patient if condition begins to worsen call PCP or go to the ER.  Patient was given the office number and encouraged to call back with question or concerns.  : yes

## 2018-07-22 NOTE — Telephone Encounter (Signed)
Called and notified patient about her lab result particularly Factor V Leiden heterogeneity. This will increase her risk of clot formation by 4-8 fold per report. So she may need indefinite anticoagulation. I also encouraged her to discuss this with her PCP. She is appreciative about the call.

## 2018-07-23 ENCOUNTER — Other Ambulatory Visit: Payer: Self-pay | Admitting: Family Medicine

## 2018-07-23 DIAGNOSIS — D6851 Activated protein C resistance: Secondary | ICD-10-CM

## 2018-07-23 LAB — PROTEIN C, TOTAL: Protein C, Total: 88 % (ref 60–150)

## 2018-07-23 LAB — PROTHROMBIN GENE MUTATION

## 2018-07-29 ENCOUNTER — Ambulatory Visit: Payer: Medicare Other | Admitting: Family Medicine

## 2018-07-29 ENCOUNTER — Other Ambulatory Visit: Payer: Self-pay

## 2018-07-29 ENCOUNTER — Encounter: Payer: Self-pay | Admitting: Family Medicine

## 2018-07-29 ENCOUNTER — Telehealth: Payer: Self-pay | Admitting: Hematology

## 2018-07-29 VITALS — BP 110/83 | HR 56 | Temp 98.1°F | Resp 16 | Ht 63.0 in | Wt 156.5 lb

## 2018-07-29 DIAGNOSIS — I2699 Other pulmonary embolism without acute cor pulmonale: Secondary | ICD-10-CM

## 2018-07-29 DIAGNOSIS — D6852 Prothrombin gene mutation: Secondary | ICD-10-CM | POA: Diagnosis not present

## 2018-07-29 DIAGNOSIS — D6851 Activated protein C resistance: Secondary | ICD-10-CM

## 2018-07-29 NOTE — Telephone Encounter (Signed)
Appointments scheduled , patient notified of date/time/location

## 2018-07-29 NOTE — Progress Notes (Signed)
   Subjective:    Patient ID: Amy Perkins, female    DOB: 16-Sep-1950, 67 y.o.   MRN: [id number]  [facility] f/u- pt was admitted 11/28-29 due to SOB, severe back pain.  D dimer was elevated and she was found to have bilateral PE on CTA.  (-) for DVT.  Coagulation studies show she is heterozygous for Factor V, Prothrombin.  Pt reports SOB is improved.  Continues to have some fatigue.  Pt has been able to resume walking.  No hx of miscarriages.     Review of Systems For ROS see HPI     Objective:   Physical Exam  Constitutional: She is oriented to person, place, and time. She appears well-developed and well-nourished. No distress.  HENT:  Head: Normocephalic and atraumatic.  Eyes: Pupils are equal, round, and reactive to light. Conjunctivae and EOM are normal.  Neck: Normal range of motion. Neck supple. No thyromegaly present.  Cardiovascular: Normal rate, regular rhythm, normal heart sounds and intact distal pulses.  No murmur heard. Pulmonary/Chest: Effort normal and breath sounds normal. No respiratory distress.  Musculoskeletal: She exhibits no edema or tenderness.  Lymphadenopathy:    She has no cervical adenopathy.  Neurological: She is alert and oriented to person, place, and time.  Skin: Skin is warm and dry.  Psychiatric: She has a normal mood and affect. Her behavior is normal.  Vitals reviewed.         Assessment & Plan:

## 2018-07-29 NOTE — Telephone Encounter (Signed)
Letter/calendar mailed as well

## 2018-07-29 NOTE — Assessment & Plan Note (Signed)
New.  Referral to hematology placed to determine next steps.  I suspect that pt will need life long anticoagulation.  Will follow.

## 2018-07-29 NOTE — Assessment & Plan Note (Signed)
New.  Pt is improving from her recent hospitalization.  SOB is improving, no longer having pleuritic CP.  Taking her Eliquis twice daily as directed.  No abnormal bleeding.

## 2018-07-29 NOTE — Patient Instructions (Signed)
Follow up as scheduled or as needed We'll call you with your Hematology appt CONTINUE the Eliquis twice daily Call with any questions or concerns Happy Holidays!!!

## 2018-08-09 ENCOUNTER — Other Ambulatory Visit: Payer: Self-pay | Admitting: Family Medicine

## 2018-08-09 MED ORDER — APIXABAN 5 MG PO TABS: 5.0000 mg | ORAL_TABLET | Freq: Two times a day (BID) | ORAL | 2 refills | Status: DC

## 2018-08-09 NOTE — Telephone Encounter (Signed)
Phone call to pt. To discuss Eliquis.  Confirmed with pt. that she is taking Eliquis 5 mg., 1 tab. BID.  Also confirmed that she [facility] [address].  Discussed with pt. Plan to only order qty. 60, with 3 refills, as opposed to 90 day supply, since she has been referred to Hematology.  Pt. verb. understanding; agreed with plan.

## 2018-08-09 NOTE — Telephone Encounter (Signed)
Copied from [place] [id number]. Topic: Quick Communication - Rx Refill/Question >> Aug 09, 2018 10:12 AM Rayann Heman wrote: Medication: apixaban (ELIQUIS) 5 MG TABS tablet [[id number]]   Has the patient contacted their pharmacy?no Preferred Pharmacy (with phone number or street name): [id number] [place], [place] - [address] [phone] (Phone) [phone] (Fax)   Agent: Please be advised that RX refills may take up to 3 business days. We ask that you follow-up with your pharmacy.

## 2018-08-19 ENCOUNTER — Other Ambulatory Visit: Payer: Self-pay | Admitting: Hematology

## 2018-08-19 DIAGNOSIS — I2699 Other pulmonary embolism without acute cor pulmonale: Secondary | ICD-10-CM

## 2018-08-19 NOTE — Progress Notes (Signed)
[place] NOTE  Patient Care Team: Midge Minium, MD as PCP - General [name], MD as Consulting Physician (Gastroenterology) Marcial Pacas, MD as Consulting Physician (Neurology) Dian Queen, MD as Consulting Physician (Obstetrics and Gynecology) Center, Skin Surgery Luberta Mutter, MD as Consulting Physician (Ophthalmology) Earley Favor (Dentistry) Cindie Crumbly (Dermatology)  HEME/ONC OVERVIEW: 1. Unprovoked bilateral PTE -End of 06/2018: CTA chest (for sudden onset CP, dyspnea) showed moderate burden PTE in the right interlobar pulmonary artery and additional bilateral PTE, no RV strain; normal cardiac function on echo; no DVT in lower extremities -07/2018 - present: Eliquis   2. Heterozygous prothrombin gene mutation and heterozygous Factor V leiden   TREATMENT REGIMEN:  Eliquis, 07/2018 - present  ASSESSMENT & PLAN:   Unprovoked bilateral PTE -I reviewed the patient's records in detail, including recent hospitalization notes -I also independently reviewed the radiologic images of recent CTA chest, agree with the findings as documented -In summary, patient presented to ER for sudden onset of chest pain and dyspnea in the end of 06/2018, and CTA chest showed bilateral PTE with moderate burden in the right interlobar pulmonary artery; doppler negative for DVT in the LE's  -She underwent hypercoagulable work-up while inpatient, which showed heterozygous prothrombin gene mutation and heterozygous factor V Leiden -She was treated with heparin drip and discharged home with Eliquis -I reviewed with the patient about the plan for care for PTE. -This last episode of blood clot appeared to be unprovoked. The goal of anticoagulation is lifelong, with or without hypercoagulable work-up, and the presence of heterozygous prothrombin gene mutation and Factor V Leiden does not change the management  -Patient is currently tolerating Eliquis well without any  abnormal bleeding or bruising -Once the patient completes at least 6 months of therapeutic anticoagulation (end of 12/2018 to early 01/2019), if there is no clinical evidence of recurrent or worsening VTE, then we can consider reducing the dose of Eliquis by half for secondary prophylaxis  -I also counseled the patient on the importance of preventive strategies such as avoiding hormonal supplement, avoiding cigarette smoking, keeping up-to-date with screening programs for early cancer detection, frequent ambulation for long distance travel and aggressive DVT prophylaxis in all surgical settings. -Should she need any interruption of the anticoagulation for elective procedures in the future, feel free to contact me regarding peri-operative management.  Heterozygous prothrombin gene mutation and heterozygous Factor V leiden  -Noted incidentally on hypercoagulable work-up in 07/2018 -Interestingly, the patient's daughter was diagnosed with DVT in the right lower extremity in her early 58s, and hypercoagulable work-up also demonstrated prothrombin gene mutation of factor V Leiden -As discussed above, the goal of anticoagulation is lifelong for unprovoked PTE, and the presence of the heterozygous gene mutations do not change the management  Orders Placed This Encounter  Procedures  . CBC with Differential (Cancer Center Only)    Standing Status:   Future    Standing Expiration Date:   09/27/2019  . CMP ([facility] only)    Standing Status:   Future    Standing Expiration Date:   09/27/2019  . D-dimer, quantitative (not at [facility])    Standing Status:   Future    Standing Expiration Date:   09/27/2019   All questions were answered. The patient knows to call the clinic with any problems, questions or concerns.  Return in 3 months for labs and clinic follow-up.  Tish Men, MD 08/23/2018 12:55 PM   CHIEF COMPLAINTS/PURPOSE OF CONSULTATION:  "I am  here for blood clot in my lungs"  HISTORY OF PRESENTING  ILLNESS:  Amy Perkins 67 y.o. female is here because of recently diagnosed unprovoked bilateral PTE and heterozygous prothrombin gene mutation/factor V Leiden.  Amy Perkins presented to the ER in end of 06/2018 for sudden onset chest pain dyspnea that woke her from her sleep.  In the ER, d-dimer was elevated, and CTA chest showed bilateral PTE, with moderate clot burden in the right interlobar artery.  Patient was started on heparin drip and admitted for further work-up.  Echocardiogram showed normal cardiac function.  Doppler of lower extremity did not show any DVT.  She was transitioned to Eliquis prior to discharge.  Since then, she has tolerated Eliquis well without any abnormal bleeding or bruising.  She reports occasional dyspnea, but otherwise denies any fever, chill, night sweats, lymphadenopathy, weight loss, chest pain, dyspnea at rest or with exertion, abdominal pain, nausea, vomiting, diarrhea, abnormal bleeding or bruising.  She is up-to-date with age-appropriate cancer screening.  MEDICAL HISTORY:  Past Medical History:  Diagnosis Date  . Depression   . Hyperlipidemia   . Migraines   . Squamous cell carcinoma 09/20/2017   chest    SURGICAL HISTORY: Past Surgical History:  Procedure Laterality Date  . DILATION AND CURETTAGE OF UTERUS     x2  . laser vein surgery    . TONSILLECTOMY      SOCIAL HISTORY: Social History   Socioeconomic History  . Marital status: Divorced    Spouse name: Not on file  . Number of children: 2  . Years of education: college  . Highest education level: Not on file  Occupational History  . Occupation: Product manager: [employer]    Comment: [employer]  Social Needs  . Financial resource strain: Not on file  . Food insecurity:    Worry: Not on file    Inability: Not on file  . Transportation needs:    Medical: Not on file    Non-medical: Not on file  Tobacco Use  . Smoking status: Never Smoker  . Smokeless tobacco: Never Used   Substance and Sexual Activity  . Alcohol use: Yes    Alcohol/week: 1.0 standard drinks    Types: 1 Glasses of wine per week    Comment: 1 glass week  . Drug use: No  . Sexual activity: Never  Lifestyle  . Physical activity:    Days per week: Not on file    Minutes per session: Not on file  . Stress: Not on file  Relationships  . Social connections:    Talks on phone: Not on file    Gets together: Not on file    Attends religious service: Not on file    Active member of club or organization: Not on file    Attends meetings of clubs or organizations: Not on file    Relationship status: Not on file  . Intimate partner violence:    Fear of current or ex partner: Not on file    Emotionally abused: Not on file    Physically abused: Not on file    Forced sexual activity: Not on file  Other Topics Concern  . Not on file  Social History Narrative   Divorced, both children live locally.   Patient works part time at [employer]. College education.   Left handed.   Caffeine- two cups coffee daily.    FAMILY HISTORY: Family History  Problem Relation Age of Onset  .  Coronary artery disease Father   . Stroke Father   . Parkinson's disease Father   . Hypertension Mother   . Diabetes Mother   . Stroke Mother   . Coronary artery disease Brother     ALLERGIES:  has No Known Allergies.  MEDICATIONS:  Current Outpatient Medications  Medication Sig Dispense Refill  . ALPRAZolam (XANAX) 0.5 MG tablet Take 0.5 mg by mouth See admin instructions. Take 1/2 tablet by mouth 2 times daily (morning and 3PM) , and 1 tablet at bedtime.    Marland Kitchen apixaban (ELIQUIS) 5 MG TABS tablet Take 1 tablet (5 mg total) by mouth 2 (two) times daily. Start on 07/26/2018 60 tablet 2  . citalopram (CELEXA) 40 MG tablet Take 1 tablet (40 mg total) by mouth daily. 30 tablet 5  . levothyroxine (SYNTHROID, LEVOTHROID) 50 MCG tablet TAKE 1 TABLET (50 MCG TOTAL) BY MOUTH DAILY. 90 tablet 1  . Multiple Vitamin  (MULTIVITAMIN) tablet Take 1 tablet by mouth every evening.     . simvastatin (ZOCOR) 40 MG tablet TAKE 1 TABLET BY MOUTH AT BEDTIME (Patient taking differently: Take 40 mg by mouth at bedtime. ) 90 tablet 1  . topiramate (TOPAMAX) 50 MG tablet TAKE 1 TABLET BY MOUTH EVERY MORNING AND TAKE 2 TABLETS EVERY EVENING (Patient taking differently: Take 50-100 mg by mouth See admin instructions. TAKE 1 TABLET BY MOUTH EVERY MORNING AND TAKE 2 TABLETS EVERY EVENING) 270 tablet 3   No current facility-administered medications for this visit.     REVIEW OF SYSTEMS:   Constitutional: ( - ) fevers, ( - )  chills , ( - ) night sweats Eyes: ( - ) blurriness of vision, ( - ) double vision, ( - ) watery eyes Ears, nose, mouth, throat, and face: ( - ) mucositis, ( - ) sore throat Respiratory: ( - ) cough, ( - ) dyspnea, ( - ) wheezes Cardiovascular: ( - ) palpitation, ( - ) chest discomfort, ( - ) lower extremity swelling Gastrointestinal:  ( - ) nausea, ( - ) heartburn, ( - ) change in bowel habits Skin: ( - ) abnormal skin rashes Lymphatics: ( - ) new lymphadenopathy, ( - ) easy bruising Neurological: ( - ) numbness, ( - ) tingling, ( - ) new weaknesses Behavioral/Psych: ( - ) mood change, ( - ) new changes  All other systems were reviewed with the patient and are negative.  PHYSICAL EXAMINATION: ECOG PERFORMANCE STATUS: 0 - Asymptomatic  Vitals:   08/23/18 1217  BP: 108/68  Pulse: 62  Resp: 16  Temp: 98.1 F (36.7 C)  SpO2: 98%   Filed Weights   08/23/18 1217  Weight: 159 lb (72.1 kg)    GENERAL: alert, no distress and comfortable, well appearing  SKIN: skin color, texture, turgor are normal, no rashes or significant lesions EYES: conjunctiva are pink and non-injected, sclera clear OROPHARYNX: no exudate, no erythema; lips, buccal mucosa, and tongue normal  NECK: supple, non-tender LYMPH:  no palpable lymphadenopathy in the cervical or axillary LUNGS: clear to auscultation and percussion  with normal breathing effort HEART: regular rate & rhythm, no murmurs, no lower extremity edema ABDOMEN: soft, non-tender, non-distended, normal bowel sounds Musculoskeletal: no cyanosis of digits and no clubbing  PSYCH: alert & oriented x 3, fluent speech NEURO: no focal motor/sensory deficits  LABORATORY DATA:  I have reviewed the data as listed Lab Results  Component Value Date   WBC 6.6 08/23/2018   HGB 14.6 08/23/2018  HCT 44.7 08/23/2018   MCV 91.0 08/23/2018   PLT 228 08/23/2018   Lab Results  Component Value Date   NA 139 08/23/2018   K 4.0 08/23/2018   CL 107 08/23/2018   CO2 26 08/23/2018    RADIOGRAPHIC STUDIES: I have personally reviewed the radiological images as listed and agreed with the findings in the report.  CTA chest (07/18/2018) IMPRESSION: 1. Moderate burden of pulmonary emboli within the RIGHT interlobar pulmonary artery extending into multiple proximal segmental pulmonary artery branches to the RIGHT lower lobe. 2. Additional pulmonary emboli within segmental pulmonary artery branches to the RIGHT middle lobe, LEFT upper lobe and lingula, some of which appear occlusive. 3. No evidence of RIGHT heart strain it at this point. 4. Patchy consolidations at the LEFT lung base, suspicious for sequela of pulmonary infarct, alternatively atelectasis. 5. Small LEFT pleural effusion.

## 2018-08-23 ENCOUNTER — Inpatient Hospital Stay: Payer: Medicare Other | Attending: Hematology

## 2018-08-23 ENCOUNTER — Inpatient Hospital Stay (HOSPITAL_BASED_OUTPATIENT_CLINIC_OR_DEPARTMENT_OTHER): Payer: Medicare Other | Admitting: Hematology

## 2018-08-23 ENCOUNTER — Encounter: Payer: Self-pay | Admitting: Hematology

## 2018-08-23 VITALS — BP 108/68 | HR 62 | Temp 98.1°F | Resp 16 | Ht 63.75 in | Wt 159.0 lb

## 2018-08-23 DIAGNOSIS — Z79899 Other long term (current) drug therapy: Secondary | ICD-10-CM | POA: Diagnosis not present

## 2018-08-23 DIAGNOSIS — F329 Major depressive disorder, single episode, unspecified: Secondary | ICD-10-CM

## 2018-08-23 DIAGNOSIS — D6851 Activated protein C resistance: Secondary | ICD-10-CM | POA: Insufficient documentation

## 2018-08-23 DIAGNOSIS — D6852 Prothrombin gene mutation: Secondary | ICD-10-CM | POA: Insufficient documentation

## 2018-08-23 DIAGNOSIS — I2699 Other pulmonary embolism without acute cor pulmonale: Secondary | ICD-10-CM | POA: Diagnosis present

## 2018-08-23 DIAGNOSIS — J90 Pleural effusion, not elsewhere classified: Secondary | ICD-10-CM | POA: Insufficient documentation

## 2018-08-23 DIAGNOSIS — E785 Hyperlipidemia, unspecified: Secondary | ICD-10-CM

## 2018-08-23 DIAGNOSIS — Z7901 Long term (current) use of anticoagulants: Secondary | ICD-10-CM | POA: Diagnosis not present

## 2018-08-23 LAB — CBC WITH DIFFERENTIAL (CANCER CENTER ONLY)
Abs Immature Granulocytes: 0.01 10*3/uL (ref 0.00–0.07)
Basophils Absolute: 0 10*3/uL (ref 0.0–0.1)
Basophils Relative: 1 %
Eosinophils Absolute: 0.2 10*3/uL (ref 0.0–0.5)
Eosinophils Relative: 3 %
HCT: 44.7 % (ref 36.0–46.0)
Hemoglobin: 14.6 g/dL (ref 12.0–15.0)
Immature Granulocytes: 0 %
Lymphocytes Relative: 36 %
Lymphs Abs: 2.3 10*3/uL (ref 0.7–4.0)
MCH: 29.7 pg (ref 26.0–34.0)
MCHC: 32.7 g/dL (ref 30.0–36.0)
MCV: 91 fL (ref 80.0–100.0)
Monocytes Absolute: 0.6 10*3/uL (ref 0.1–1.0)
Monocytes Relative: 9 %
Neutro Abs: 3.4 10*3/uL (ref 1.7–7.7)
Neutrophils Relative %: 51 %
Platelet Count: 228 10*3/uL (ref 150–400)
RBC: 4.91 MIL/uL (ref 3.87–5.11)
RDW: 12.6 % (ref 11.5–15.5)
WBC Count: 6.6 10*3/uL (ref 4.0–10.5)
nRBC: 0 % (ref 0.0–0.2)

## 2018-08-23 LAB — CMP (CANCER CENTER ONLY)
ALT: 51 U/L — ABNORMAL HIGH (ref 0–44)
AST: 23 U/L (ref 15–41)
Albumin: 4.4 g/dL (ref 3.5–5.0)
Alkaline Phosphatase: 63 U/L (ref 38–126)
Anion gap: 6 (ref 5–15)
BUN: 23 mg/dL (ref 8–23)
CO2: 26 mmol/L (ref 22–32)
Calcium: 9.5 mg/dL (ref 8.9–10.3)
Chloride: 107 mmol/L (ref 98–111)
Creatinine: 0.8 mg/dL (ref 0.44–1.00)
GFR, Est AFR Am: >60 mL/min (ref >60)
GFR, Estimated: >60 mL/min (ref >60)
Glucose, Bld: 83 mg/dL (ref 70–99)
Potassium: 4 mmol/L (ref 3.5–5.1)
Sodium: 139 mmol/L (ref 135–145)
Total Bilirubin: 0.4 mg/dL (ref 0.3–1.2)
Total Protein: 6.5 g/dL (ref 6.5–8.1)

## 2018-08-23 LAB — D-DIMER, QUANTITATIVE: D-Dimer, Quant: 0.39 ug/mL-FEU (ref 0.00–0.50)

## 2018-10-15 NOTE — Progress Notes (Addendum)
Subjective:   Amy Perkins is a 68 y.o. female who presents for Medicare Annual (Subsequent) preventive examination.  Review of Systems:  No ROS.  Medicare Wellness Visit. Additional risk factors are reflected in the social history.  Cardiac Risk Factors include: advanced age (>56men, >46 women);dyslipidemia;family history of premature cardiovascular disease   Sleep patterns: Sleeps 6-7 hours.  Home Safety/Smoke Alarms: Feels safe in home. Smoke alarms in place.  Living environment; residence and Firearm Safety: Son lives with pt in 1 story home.  Seat Belt Safety/Bike Helmet: Wears seat belt.   Female:   Pap-N/A       Mammo-08/21/2017, Negative. Scheduled in 11/2018 Helane Rima). Dexa scan-10/11/2016, Normal. Scheduled in 11/2018 Helane Rima). CCS-02/18/2010, normal.       Objective:     Vitals: BP 110/62 (BP Location: Left Arm, Patient Position: Sitting, Cuff Size: Normal)   Pulse (!) 58   Temp 98.6 F (37 C) (Temporal)   Resp 16   Ht 5\' 4"  (1.626 m)   Wt 157 lb 2 oz (71.3 kg)   SpO2 98%   BMI 26.97 kg/m   Body mass index is 26.97 kg/m.  Advanced Directives 10/16/2018 08/23/2018 07/18/2018 07/18/2018 10/17/2017 10/10/2017 07/29/2015  Does Patient Have a Medical Advance Directive? Yes Yes Yes Yes Yes Yes Yes  Type of Advance Directive Living will;Healthcare Power of [place];Living will Healthcare Power of [place];Living will [place];Living will [place];Living will [place];Living will  Does patient want to make changes to medical advance directive? - No - Patient declined No - Patient declined - Yes (MAU/Ambulatory/Procedural Areas - Information given) - -  Copy of [place] in Chart? No - copy requested No - copy requested No - copy requested No - copy requested Yes No - copy requested No - copy requested    Tobacco Social History    Tobacco Use  Smoking Status Never Smoker  Smokeless Tobacco Never Used     Counseling given: Not Answered   Past Medical History:  Diagnosis Date  . Depression   . Hyperlipidemia   . Migraines   . Squamous cell carcinoma 09/20/2017   chest   Past Surgical History:  Procedure Laterality Date  . DILATION AND CURETTAGE OF UTERUS     x2  . laser vein surgery    . TONSILLECTOMY     Family History  Problem Relation Age of Onset  . Coronary artery disease Father   . Stroke Father   . Parkinson's disease Father   . Hypertension Mother   . Diabetes Mother   . Stroke Mother   . Coronary artery disease Brother   . Factor V Leiden deficiency Daughter    Social History   Socioeconomic History  . Marital status: Divorced    Spouse name: Not on file  . Number of children: 2  . Years of education: college  . Highest education level: Not on file  Occupational History  . Occupation: Product manager: [employer]    Comment: [employer]  Social Needs  . Financial resource strain: Not on file  . Food insecurity:    Worry: Not on file    Inability: Not on file  . Transportation needs:    Medical: Not on file    Non-medical: Not on file  Tobacco Use  . Smoking status: Never Smoker  . Smokeless tobacco: Never Used  Substance and Sexual Activity  .  Alcohol use: Yes    Alcohol/week: 4.0 standard drinks    Types: 4 Glasses of wine per week    Comment: 4 glass week  . Drug use: No  . Sexual activity: Never  Lifestyle  . Physical activity:    Days per week: Not on file    Minutes per session: Not on file  . Stress: Not on file  Relationships  . Social connections:    Talks on phone: Not on file    Gets together: Not on file    Attends religious service: Not on file    Active member of club or organization: Not on file    Attends meetings of clubs or organizations: Not on file    Relationship status: Not on file  Other Topics Concern  . Not on file  Social History  Narrative   Divorced, both children live locally.   Patient works part time at [employer]. College education.   Left handed.   Caffeine- two cups coffee daily.    Outpatient Encounter Medications as of 10/16/2018  Medication Sig  . ALPRAZolam (XANAX) 0.5 MG tablet Take 0.5 mg by mouth See admin instructions. Take 1/2 tablet by mouth 2 times daily (morning and 3PM) , and 1 tablet at bedtime.  Marland Kitchen apixaban (ELIQUIS) 5 MG TABS tablet Take 1 tablet (5 mg total) by mouth 2 (two) times daily. Start on 07/26/2018  . Biotin 1000 MCG CHEW Chew by mouth.  . citalopram (CELEXA) 40 MG tablet Take 1 tablet (40 mg total) by mouth daily.  Marland Kitchen levothyroxine (SYNTHROID, LEVOTHROID) 50 MCG tablet TAKE 1 TABLET (50 MCG TOTAL) BY MOUTH DAILY.  . Multiple Vitamin (MULTIVITAMIN) tablet Take 1 tablet by mouth every evening.   . simvastatin (ZOCOR) 40 MG tablet TAKE 1 TABLET BY MOUTH AT BEDTIME (Patient taking differently: Take 40 mg by mouth at bedtime. )  . topiramate (TOPAMAX) 50 MG tablet TAKE 1 TABLET BY MOUTH EVERY MORNING AND TAKE 2 TABLETS EVERY EVENING (Patient taking differently: Take 50-100 mg by mouth See admin instructions. TAKE 1 TABLET BY MOUTH EVERY MORNING AND TAKE 2 TABLETS EVERY EVENING)  . Zoster Vaccine Adjuvanted [facility]) injection Inject 0.5 mLs into the muscle once for 1 dose.   No facility-administered encounter medications on file as of 10/16/2018.     Activities of Daily Living In your present state of health, do you have any difficulty performing the following activities: 10/16/2018 07/29/2018  Hearing? Y N  Vision? N N  Difficulty concentrating or making decisions? N N  Walking or climbing stairs? N N  Dressing or bathing? N N  Doing errands, shopping? N N  Preparing Food and eating ? N -  Using the Toilet? N -  In the past six months, have you accidently leaked urine? N -  Do you have problems with loss of bowel control? N -  Managing your Medications? N -  Managing your  Finances? N -  Housekeeping or managing your Housekeeping? N -  Some recent data might be hidden    Patient Care Team: Midge Minium, MD as PCP - General [name], MD as Consulting Physician (Gastroenterology) Marcial Pacas, MD as Consulting Physician (Neurology) Dian Queen, MD as Consulting Physician (Obstetrics and Gynecology) Center, Skin Surgery Luberta Mutter, MD as Consulting Physician (Ophthalmology) Earley Favor (Dentistry) Cindie Crumbly (Dermatology) Tish Men, MD as Consulting Physician (Hematology) Milly Jakob, MD as Consulting Physician (Orthopedic Surgery)    Assessment:   This is a routine wellness  examination for [place].  Exercise Activities and Dietary recommendations Current Exercise Habits: Structured exercise class, Type of exercise: walking;treadmill(walks dog 2-3/day; gym 1/week), Frequency (Times/Week): 1, Exercise limited by: None identified   Diet (meal preparation, eat out, water intake, caffeinated beverages, dairy products, fruits and vegetables): Drinks water, milk, coffee  Breakfast: cereal, juice, bagel, coffee Lunch: sandwich, fruit, yogurt, milk Dinner: frozen dinner, fruit, milk  Goals    . Weight (lb) < 145 lb (65.8 kg)     Lose weight by increasing activity and watching caloric intake.     . Weight (lb) < 145 lb (65.8 kg)     Lose weight by increasing activity and continuing to watching diet.        Fall Risk Fall Risk  10/16/2018 07/29/2018 07/29/2018 10/17/2017 10/10/2017  Falls in the past year? 0 1 0 Yes Yes  Number falls in past yr: - 0 - 1 1  Injury with Fall? - 0 - No No  Follow up - - - Falls prevention discussed Falls prevention discussed    Depression Screen PHQ 2/9 Scores 10/16/2018 07/29/2018 10/17/2017 10/10/2017  PHQ - 2 Score 2 0 1 2  PHQ- 9 Score 3 0 1 3  Exception Documentation - - - -  Not completed - - - -     Cognitive Function MMSE - Mini Mental State Exam 10/16/2018 10/10/2017  Orientation to time 5  5  Orientation to Place 5 5  Registration 3 3  Attention/ Calculation 5 5  Recall 3 3  Language- name 2 objects 2 2  Language- repeat 1 1  Language- follow 3 step command 3 3  Language- read & follow direction 1 1  Write a sentence 1 1  Copy design 1 1  Total score 30 30        Immunization History  Administered Date(s) Administered  . H1N1 09/30/2008  . Influenza Split 06/28/2011  . Influenza Whole 05/06/2010  . Influenza,inj,Quad PF,6+ Mos 07/08/2013, 07/03/2014, 06/05/2016, 04/13/2017, 04/08/2018  . Influenza-Unspecified 06/22/2015  . Pneumococcal Conjugate-13 04/01/2015  . Pneumococcal Polysaccharide-23 04/03/2016  . Td 05/22/2005  . Tdap 06/28/2011  . Zoster 01/14/2014    Screening Tests Health Maintenance  Topic Date Due  . Hepatitis C Screening  [birth year]  . MAMMOGRAM  10/11/2018  . COLONOSCOPY  02/19/2020  . TETANUS/TDAP  06/27/2021  . INFLUENZA VACCINE  Completed  . DEXA SCAN  Completed  . PNA vac Low Risk Adult  Completed        Plan:     Shingles vaccine at pharmacy.   Bring a copy of your living will and/or healthcare power of attorney to your next office visit.  Continue doing brain stimulating activities (puzzles, reading, adult coloring books, staying active) to keep memory sharp.   I have personally reviewed and noted the following in the patient's chart:   . Medical and social history . Use of alcohol, tobacco or illicit drugs  . Current medications and supplements . Functional ability and status . Nutritional status . Physical activity . Advanced directives . List of other physicians . Hospitalizations, surgeries, and ER visits in previous 12 months . Vitals . Screenings to include cognitive, depression, and falls . Referrals and appointments  In addition, I have reviewed and discussed with patient certain preventive protocols, quality metrics, and best practice recommendations. A written personalized care plan for preventive  services as well as general preventive health recommendations were provided to patient.     Gerilyn Nestle, RN  10/16/2018  Reviewed documentation provided by RN and agree w/ above.  Annye Asa, MD

## 2018-10-16 ENCOUNTER — Ambulatory Visit (INDEPENDENT_AMBULATORY_CARE_PROVIDER_SITE_OTHER): Payer: Medicare Other

## 2018-10-16 ENCOUNTER — Other Ambulatory Visit: Payer: Self-pay

## 2018-10-16 ENCOUNTER — Ambulatory Visit (INDEPENDENT_AMBULATORY_CARE_PROVIDER_SITE_OTHER): Payer: Medicare Other | Admitting: Family Medicine

## 2018-10-16 ENCOUNTER — Encounter: Payer: Self-pay | Admitting: Family Medicine

## 2018-10-16 VITALS — BP 110/62 | HR 58 | Temp 98.6°F | Resp 16 | Ht 64.0 in | Wt 157.1 lb

## 2018-10-16 DIAGNOSIS — Z01118 Encounter for examination of ears and hearing with other abnormal findings: Secondary | ICD-10-CM | POA: Diagnosis not present

## 2018-10-16 DIAGNOSIS — R94128 Abnormal results of other function studies of ear and other special senses: Secondary | ICD-10-CM

## 2018-10-16 DIAGNOSIS — Z0000 Encounter for general adult medical examination without abnormal findings: Secondary | ICD-10-CM

## 2018-10-16 DIAGNOSIS — Z23 Encounter for immunization: Secondary | ICD-10-CM

## 2018-10-16 DIAGNOSIS — Z1159 Encounter for screening for other viral diseases: Secondary | ICD-10-CM

## 2018-10-16 DIAGNOSIS — G43009 Migraine without aura, not intractable, without status migrainosus: Secondary | ICD-10-CM | POA: Diagnosis not present

## 2018-10-16 DIAGNOSIS — E785 Hyperlipidemia, unspecified: Secondary | ICD-10-CM

## 2018-10-16 DIAGNOSIS — Z9189 Other specified personal risk factors, not elsewhere classified: Secondary | ICD-10-CM

## 2018-10-16 LAB — CBC WITH DIFFERENTIAL/PLATELET
Basophils Absolute: 0 10*3/uL (ref 0.0–0.1)
Basophils Relative: 0.7 % (ref 0.0–3.0)
Eosinophils Absolute: 0.2 10*3/uL (ref 0.0–0.7)
Eosinophils Relative: 3.3 % (ref 0.0–5.0)
HCT: 45.8 % (ref 36.0–46.0)
Hemoglobin: 15.7 g/dL — ABNORMAL HIGH (ref 12.0–15.0)
Lymphocytes Relative: 35.4 % (ref 12.0–46.0)
Lymphs Abs: 1.9 10*3/uL (ref 0.7–4.0)
MCHC: 34.3 g/dL (ref 30.0–36.0)
MCV: 89.2 fl (ref 78.0–100.0)
Monocytes Absolute: 0.3 10*3/uL (ref 0.1–1.0)
Monocytes Relative: 6.7 % (ref 3.0–12.0)
Neutro Abs: 2.8 10*3/uL (ref 1.4–7.7)
Neutrophils Relative %: 53.9 % (ref 43.0–77.0)
Platelets: 200 10*3/uL (ref 150.0–400.0)
RBC: 5.13 Mil/uL — ABNORMAL HIGH (ref 3.87–5.11)
RDW: 13.2 % (ref 11.5–15.5)
WBC: 5.2 10*3/uL (ref 4.0–10.5)

## 2018-10-16 LAB — LIPID PANEL
Cholesterol: 179 mg/dL (ref 0–200)
HDL: 55.4 mg/dL (ref >39.00)
LDL Cholesterol: 103 mg/dL — ABNORMAL HIGH (ref 0–99)
NonHDL: 123.38
Total CHOL/HDL Ratio: 3
Triglycerides: 103 mg/dL (ref 0.0–149.0)
VLDL: 20.6 mg/dL (ref 0.0–40.0)

## 2018-10-16 LAB — HEPATIC FUNCTION PANEL
ALT: 20 U/L (ref 0–35)
AST: 15 U/L (ref 0–37)
Albumin: 4.6 g/dL (ref 3.5–5.2)
Alkaline Phosphatase: 63 U/L (ref 39–117)
Bilirubin, Direct: 0.1 mg/dL (ref 0.0–0.3)
Total Bilirubin: 0.4 mg/dL (ref 0.2–1.2)
Total Protein: 6.5 g/dL (ref 6.0–8.3)

## 2018-10-16 LAB — BASIC METABOLIC PANEL
BUN: 20 mg/dL (ref 6–23)
CO2: 27 mEq/L (ref 19–32)
Calcium: 9.7 mg/dL (ref 8.4–10.5)
Chloride: 107 mEq/L (ref 96–112)
Creatinine, Ser: 0.76 mg/dL (ref 0.40–1.20)
GFR: 75.53 mL/min (ref >60.00)
Glucose, Bld: 106 mg/dL — ABNORMAL HIGH (ref 70–99)
Potassium: 4 mEq/L (ref 3.5–5.1)
Sodium: 142 mEq/L (ref 135–145)

## 2018-10-16 MED ORDER — ZOSTER VAC RECOMB ADJUVANTED 50 MCG/0.5ML IM SUSR: 0.5000 mL | Freq: Once | INTRAMUSCULAR | 1 refills | Status: AC

## 2018-10-16 NOTE — Patient Instructions (Signed)
Follow up in 6 months to recheck cholesterol We'll notify you of your lab results and make any changes if needed Keep up the good work on healthy diet and regular exercise We'll call you with your Neuro and ENT appts Please have GYN send me a copy of their note/results Call with any questions or concerns Happy [place]!!

## 2018-10-16 NOTE — Progress Notes (Signed)
   Subjective:    Patient ID: Amy Perkins, female    DOB: Aug 22, 1950, 68 y.o.   MRN: [id number]  HPI CPE- UTD on pap, mammo.     Review of Systems Patient reports no vision changes, adenopathy,fever, weight change,  persistant/recurrent hoarseness , swallowing issues, chest pain, palpitations, edema, persistant/recurrent cough, hemoptysis, dyspnea (rest/exertional/paroxysmal nocturnal), gastrointestinal bleeding (melena, rectal bleeding), abdominal pain, significant heartburn, bowel changes, GU symptoms (dysuria, hematuria, incontinence), Gyn symptoms (abnormal  bleeding, pain),  syncope, focal weakness, memory loss, numbness & tingling, skin/hair/nail changes, abnormal bruising or bleeding, anxiety, or depression.   + hearing loss- pt failed hearing test and wants ENT referral + HA- hx of migraines, now occurring daily.  Has previously seen Neuro.  Taking ibuprofen daily.    Objective:   Physical Exam General Appearance:    Alert, cooperative, no distress, appears stated age  Head:    Normocephalic, without obvious abnormality, atraumatic  Eyes:    PERRL, conjunctiva/corneas clear, EOM's intact, fundi    benign, both eyes  Ears:    L TM obscured by wax, R TM WNL  Nose:   Nares normal, septum midline, mucosa normal, no drainage    or sinus tenderness  Throat:   Lips, mucosa, and tongue normal; teeth and gums normal  Neck:   Supple, symmetrical, trachea midline, no adenopathy;    Thyroid: no enlargement/tenderness/nodules  Back:     Symmetric, no curvature, ROM normal, no CVA tenderness  Lungs:     Clear to auscultation bilaterally, respirations unlabored  Chest Wall:    No tenderness or deformity   Heart:    Regular rate and rhythm, S1 and S2 normal, no murmur, rub   or gallop  Breast Exam:    Deferred to GYN  Abdomen:     Soft, non-tender, bowel sounds active all four quadrants,    no masses, no organomegaly  Genitalia:    Deferred to GYN  Rectal:    Extremities:   Extremities  normal, atraumatic, no cyanosis or edema  Pulses:   2+ and symmetric all extremities  Skin:   Skin color, texture, turgor normal, no rashes or lesions  Lymph nodes:   Cervical, supraclavicular, and axillary nodes normal  Neurologic:   CNII-XII intact, normal strength, sensation and reflexes    throughout          Assessment & Plan:

## 2018-10-16 NOTE — Patient Instructions (Addendum)
Shingles vaccine at pharmacy.   Bring a copy of your living will and/or healthcare power of attorney to your next office visit.  Continue doing brain stimulating activities (puzzles, reading, adult coloring books, staying active) to keep memory sharp.   Health Maintenance, Female Adopting a healthy lifestyle and getting preventive care can go a long way to promote health and wellness. Talk with your health care provider about what schedule of regular examinations is right for you. This is a good chance for you to check in with your provider about disease prevention and staying healthy. In between checkups, there are plenty of things you can do on your own. Experts have done a lot of research about which lifestyle changes and preventive measures are most likely to keep you healthy. Ask your health care provider for more information. Weight and diet Eat a healthy diet  Be sure to include plenty of vegetables, fruits, low-fat dairy products, and lean protein.  Do not eat a lot of foods high in solid fats, added sugars, or salt.  Get regular exercise. This is one of the most important things you can do for your health. ? Most adults should exercise for at least 150 minutes each week. The exercise should increase your heart rate and make you sweat (moderate-intensity exercise). ? Most adults should also do strengthening exercises at least twice a week. This is in addition to the moderate-intensity exercise. Maintain a healthy weight  Body mass index (BMI) is a measurement that can be used to identify possible weight problems. It estimates body fat based on height and weight. Your health care provider can help determine your BMI and help you achieve or maintain a healthy weight.  For females 69 years of age and older: ? A BMI below 18.5 is considered underweight. ? A BMI of 18.5 to 24.9 is normal. ? A BMI of 25 to 29.9 is considered overweight. ? A BMI of 30 and above is considered obese. Watch  levels of cholesterol and blood lipids  You should start having your blood tested for lipids and cholesterol at 69 years of age, then have this test every 5 years.  You may need to have your cholesterol levels checked more often if: ? Your lipid or cholesterol levels are high. ? You are older than 69 years of age. ? You are at high risk for heart disease. Cancer screening Lung Cancer  Lung cancer screening is recommended for adults 69-76 years old who are at high risk for lung cancer because of a history of smoking.  A yearly low-dose CT scan of the lungs is recommended for people who: ? Currently smoke. ? Have quit within the past 15 years. ? Have at least a 30-pack-year history of smoking. A pack year is smoking an average of one pack of cigarettes a day for 1 year.  Yearly screening should continue until it has been 15 years since you quit.  Yearly screening should stop if you develop a health problem that would prevent you from having lung cancer treatment. Breast Cancer  Practice breast self-awareness. This means understanding how your breasts normally appear and feel.  It also means doing regular breast self-exams. Let your health care provider know about any changes, no matter how small.  If you are in your 20s or 30s, you should have a clinical breast exam (CBE) by a health care provider every 1-3 years as part of a regular health exam.  If you are 49 or older, have a  CBE every year. Also consider having a breast X-ray (mammogram) every year.  If you have a family history of breast cancer, talk to your health care provider about genetic screening.  If you are at high risk for breast cancer, talk to your health care provider about having an MRI and a mammogram every year.  Breast cancer gene (BRCA) assessment is recommended for women who have family members with BRCA-related cancers. BRCA-related cancers include: ? Breast. ? Ovarian. ? Tubal. ? Peritoneal  cancers.  Results of the assessment will determine the need for genetic counseling and BRCA1 and BRCA2 testing. Cervical Cancer Your health care provider may recommend that you be screened regularly for cancer of the pelvic organs (ovaries, uterus, and vagina). This screening involves a pelvic examination, including checking for microscopic changes to the surface of your cervix (Pap test). You may be encouraged to have this screening done every 3 years, beginning at age 69.  For women ages 69-65, health care providers may recommend pelvic exams and Pap testing every 3 years, or they may recommend the Pap and pelvic exam, combined with testing for human papilloma virus (HPV), every 5 years. Some types of HPV increase your risk of cervical cancer. Testing for HPV may also be done on women of any age with unclear Pap test results.  Other health care providers may not recommend any screening for nonpregnant women who are considered low risk for pelvic cancer and who do not have symptoms. Ask your health care provider if a screening pelvic exam is right for you.  If you have had past treatment for cervical cancer or a condition that could lead to cancer, you need Pap tests and screening for cancer for at least 20 years after your treatment. If Pap tests have been discontinued, your risk factors (such as having a new sexual partner) need to be reassessed to determine if screening should resume. Some women have medical problems that increase the chance of getting cervical cancer. In these cases, your health care provider may recommend more frequent screening and Pap tests. Colorectal Cancer  This type of cancer can be detected and often prevented.  Routine colorectal cancer screening usually begins at 69 years of age and continues through 68 years of age.  Your health care provider may recommend screening at an earlier age if you have risk factors for colon cancer.  Your health care provider may also  recommend using home test kits to check for hidden blood in the stool.  A small camera at the end of a tube can be used to examine your colon directly (sigmoidoscopy or colonoscopy). This is done to check for the earliest forms of colorectal cancer.  Routine screening usually begins at age 24.  Direct examination of the colon should be repeated every 5-10 years through 69 years of age. However, you may need to be screened more often if early forms of precancerous polyps or small growths are found. Skin Cancer  Check your skin from head to toe regularly.  Tell your health care provider about any new moles or changes in moles, especially if there is a change in a mole's shape or color.  Also tell your health care provider if you have a mole that is larger than the size of a pencil eraser.  Always use sunscreen. Apply sunscreen liberally and repeatedly throughout the day.  Protect yourself by wearing long sleeves, pants, a wide-brimmed hat, and sunglasses whenever you are outside. Heart disease, diabetes, and high blood  pressure  High blood pressure causes heart disease and increases the risk of stroke. High blood pressure is more likely to develop in: ? People who have blood pressure in the high end of the normal range (130-139/85-89 mm Hg). ? People who are overweight or obese. ? People who are African American.  If you are 83-21 years of age, have your blood pressure checked every 3-5 years. If you are 7 years of age or older, have your blood pressure checked every year. You should have your blood pressure measured twice-once when you are at a hospital or clinic, and once when you are not at a hospital or clinic. Record the average of the two measurements. To check your blood pressure when you are not at a hospital or clinic, you can use: ? An automated blood pressure machine at a pharmacy. ? A home blood pressure monitor.  If you are between 11 years and 80 years old, ask your health  care provider if you should take aspirin to prevent strokes.  Have regular diabetes screenings. This involves taking a blood sample to check your fasting blood sugar level. ? If you are at a normal weight and have a low risk for diabetes, have this test once every three years after 69 years of age. ? If you are overweight and have a high risk for diabetes, consider being tested at a younger age or more often. Preventing infection Hepatitis B  If you have a higher risk for hepatitis B, you should be screened for this virus. You are considered at high risk for hepatitis B if: ? You were born in a country where hepatitis B is common. Ask your health care provider which countries are considered high risk. ? Your parents were born in a high-risk country, and you have not been immunized against hepatitis B (hepatitis B vaccine). ? You have HIV or AIDS. ? You use needles to inject street drugs. ? You live with someone who has hepatitis B. ? You have had sex with someone who has hepatitis B. ? You get hemodialysis treatment. ? You take certain medicines for conditions, including cancer, organ transplantation, and autoimmune conditions. Hepatitis C  Blood testing is recommended for: ? Everyone born from [birth year] through 1966. ? Anyone with known risk factors for hepatitis C. Sexually transmitted infections (STIs)  You should be screened for sexually transmitted infections (STIs) including gonorrhea and chlamydia if: ? You are sexually active and are younger than 69 years of age. ? You are older than 69 years of age and your health care provider tells you that you are at risk for this type of infection. ? Your sexual activity has changed since you were last screened and you are at an increased risk for chlamydia or gonorrhea. Ask your health care provider if you are at risk.  If you do not have HIV, but are at risk, it may be recommended that you take a prescription medicine daily to prevent HIV  infection. This is called pre-exposure prophylaxis (PrEP). You are considered at risk if: ? You are sexually active and do not regularly use condoms or know the HIV status of your partner(s). ? You take drugs by injection. ? You are sexually active with a partner who has HIV. Talk with your health care provider about whether you are at high risk of being infected with HIV. If you choose to begin PrEP, you should first be tested for HIV. You should then be tested every 3 months for  as long as you are taking PrEP. Pregnancy  If you are premenopausal and you may become pregnant, ask your health care provider about preconception counseling.  If you may become pregnant, take 400 to 800 micrograms (mcg) of folic acid every day.  If you want to prevent pregnancy, talk to your health care provider about birth control (contraception). Osteoporosis and menopause  Osteoporosis is a disease in which the bones lose minerals and strength with aging. This can result in serious bone fractures. Your risk for osteoporosis can be identified using a bone density scan.  If you are 32 years of age or older, or if you are at risk for osteoporosis and fractures, ask your health care provider if you should be screened.  Ask your health care provider whether you should take a calcium or vitamin D supplement to lower your risk for osteoporosis.  Menopause may have certain physical symptoms and risks.  Hormone replacement therapy may reduce some of these symptoms and risks. Talk to your health care provider about whether hormone replacement therapy is right for you. Follow these instructions at home:  Schedule regular health, dental, and eye exams.  Stay current with your immunizations.  Do not use any tobacco products including cigarettes, chewing tobacco, or electronic cigarettes.  If you are pregnant, do not drink alcohol.  If you are breastfeeding, limit how much and how often you drink alcohol.  Limit  alcohol intake to no more than 1 drink per day for nonpregnant women. One drink equals 12 ounces of beer, 5 ounces of wine, or 1 ounces of hard liquor.  Do not use street drugs.  Do not share needles.  Ask your health care provider for help if you need support or information about quitting drugs.  Tell your health care provider if you often feel depressed.  Tell your health care provider if you have ever been abused or do not feel safe at home. This information is not intended to replace advice given to you by your health care provider. Make sure you discuss any questions you have with your health care provider. Document Released: 02/20/2011 Document Revised: 01/13/2016 Document Reviewed: 05/11/2015 Elsevier Interactive Patient Education  2019 [employer].

## 2018-10-16 NOTE — Assessment & Plan Note (Signed)
Pt's PE WNL w/ exception of being mildly overweight.  UTD on colonoscopy, GYN, immunizations.  Check labs.  Anticipatory guidance provided.

## 2018-10-16 NOTE — Assessment & Plan Note (Signed)
Increased frequency recently.  Had previously seen Neuro- will refer back.

## 2018-10-16 NOTE — Assessment & Plan Note (Signed)
Chronic problem.  Tolerating statin w/o difficulty.  Check labs.  Adjust meds prn  

## 2018-10-17 LAB — TSH: TSH: 2.39 u[IU]/mL (ref 0.35–4.50)

## 2018-10-17 LAB — HEPATITIS C ANTIBODY
Hepatitis C Ab: NONREACTIVE
SIGNAL TO CUT-OFF: 0.01 (ref <1.00)

## 2018-11-05 ENCOUNTER — Other Ambulatory Visit: Payer: Self-pay | Admitting: Family Medicine

## 2018-11-19 ENCOUNTER — Telehealth: Payer: Self-pay | Admitting: Hematology

## 2018-11-19 NOTE — Telephone Encounter (Signed)
Appointments r/s and LMVM for patient per 3/31 sch msg

## 2018-11-22 ENCOUNTER — Ambulatory Visit: Payer: Medicare Other | Admitting: Hematology

## 2018-11-22 ENCOUNTER — Other Ambulatory Visit: Payer: Medicare Other

## 2018-11-27 ENCOUNTER — Other Ambulatory Visit: Payer: Self-pay | Admitting: Family Medicine

## 2018-12-12 ENCOUNTER — Telehealth: Payer: Self-pay | Admitting: *Deleted

## 2018-12-12 NOTE — Telephone Encounter (Signed)
Patient c/o episodes of low blood sugar. She is wanting to know if her Eliquis can cause this side effect. Reviewed with patient that this is unlikely. Instructed patient to follow up with her PCP regarding continued symptoms. She agreed.

## 2018-12-17 ENCOUNTER — Other Ambulatory Visit: Payer: Self-pay | Admitting: Family Medicine

## 2018-12-25 ENCOUNTER — Telehealth: Payer: Self-pay | Admitting: Hematology

## 2018-12-25 NOTE — Telephone Encounter (Signed)
Patient LMVM to confirm appt for 5/8

## 2018-12-27 ENCOUNTER — Encounter: Payer: Self-pay | Admitting: Hematology

## 2018-12-27 ENCOUNTER — Other Ambulatory Visit: Payer: Self-pay

## 2018-12-27 ENCOUNTER — Inpatient Hospital Stay (HOSPITAL_BASED_OUTPATIENT_CLINIC_OR_DEPARTMENT_OTHER): Payer: Medicare Other | Admitting: Hematology

## 2018-12-27 ENCOUNTER — Inpatient Hospital Stay: Payer: Medicare Other | Attending: Hematology

## 2018-12-27 VITALS — BP 109/78 | HR 66 | Temp 97.9°F | Resp 18 | Ht 64.0 in | Wt 159.0 lb

## 2018-12-27 DIAGNOSIS — D6851 Activated protein C resistance: Secondary | ICD-10-CM

## 2018-12-27 DIAGNOSIS — Z7901 Long term (current) use of anticoagulants: Secondary | ICD-10-CM | POA: Insufficient documentation

## 2018-12-27 DIAGNOSIS — D6852 Prothrombin gene mutation: Secondary | ICD-10-CM

## 2018-12-27 DIAGNOSIS — Z79899 Other long term (current) drug therapy: Secondary | ICD-10-CM

## 2018-12-27 DIAGNOSIS — I2699 Other pulmonary embolism without acute cor pulmonale: Secondary | ICD-10-CM | POA: Diagnosis present

## 2018-12-27 LAB — CBC WITH DIFFERENTIAL (CANCER CENTER ONLY)
Abs Immature Granulocytes: 0.02 10*3/uL (ref 0.00–0.07)
Basophils Absolute: 0 10*3/uL (ref 0.0–0.1)
Basophils Relative: 1 %
Eosinophils Absolute: 0.2 10*3/uL (ref 0.0–0.5)
Eosinophils Relative: 3 %
HCT: 44 % (ref 36.0–46.0)
Hemoglobin: 14.7 g/dL (ref 12.0–15.0)
Immature Granulocytes: 0 %
Lymphocytes Relative: 33 %
Lymphs Abs: 1.9 10*3/uL (ref 0.7–4.0)
MCH: 30.1 pg (ref 26.0–34.0)
MCHC: 33.4 g/dL (ref 30.0–36.0)
MCV: 90.2 fL (ref 80.0–100.0)
Monocytes Absolute: 0.5 10*3/uL (ref 0.1–1.0)
Monocytes Relative: 8 %
Neutro Abs: 3.3 10*3/uL (ref 1.7–7.7)
Neutrophils Relative %: 55 %
Platelet Count: 196 10*3/uL (ref 150–400)
RBC: 4.88 MIL/uL (ref 3.87–5.11)
RDW: 12.3 % (ref 11.5–15.5)
WBC Count: 5.8 10*3/uL (ref 4.0–10.5)
nRBC: 0 % (ref 0.0–0.2)

## 2018-12-27 LAB — CMP (CANCER CENTER ONLY)
ALT: 26 U/L (ref 0–44)
AST: 16 U/L (ref 15–41)
Albumin: 4.4 g/dL (ref 3.5–5.0)
Alkaline Phosphatase: 67 U/L (ref 38–126)
Anion gap: 6 (ref 5–15)
BUN: 18 mg/dL (ref 8–23)
CO2: 28 mmol/L (ref 22–32)
Calcium: 10 mg/dL (ref 8.9–10.3)
Chloride: 107 mmol/L (ref 98–111)
Creatinine: 0.84 mg/dL (ref 0.44–1.00)
GFR, Est AFR Am: >60 mL/min (ref >60)
GFR, Estimated: >60 mL/min (ref >60)
Glucose, Bld: 79 mg/dL (ref 70–99)
Potassium: 4.3 mmol/L (ref 3.5–5.1)
Sodium: 141 mmol/L (ref 135–145)
Total Bilirubin: 0.4 mg/dL (ref 0.3–1.2)
Total Protein: 6.4 g/dL — ABNORMAL LOW (ref 6.5–8.1)

## 2018-12-27 LAB — D-DIMER, QUANTITATIVE: D-Dimer, Quant: 0.36 ug/mL-FEU (ref 0.00–0.50)

## 2018-12-27 NOTE — Progress Notes (Signed)
[facility] OFFICE PROGRESS NOTE  Patient Care Team: Midge Minium, MD as PCP - General Amy Craver, MD as Consulting Physician (Gastroenterology) Amy Pacas, MD as Consulting Physician (Neurology) Amy Queen, MD as Consulting Physician (Obstetrics and Gynecology) [facility] Amy Mutter, MD as Consulting Physician (Ophthalmology) Amy Perkins (Dentistry) Amy Perkins (Dermatology) Tish Men, MD as Consulting Physician (Hematology) Milly Jakob, MD as Consulting Physician (Orthopedic Surgery)  HEME/ONC OVERVIEW: 1. Unprovoked bilateral PTE -End of 06/2018: CTA chest (for sudden onset CP, dyspnea) showed moderate burden PTE in the right interlobar pulmonary artery and additional bilateral PTE, no RV strain; normal cardiac function on echo; no DVT in lower extremities -07/2018 - present: Eliquis 5mg  BID  2. Heterozygous prothrombin gene mutation and heterozygous Factor V leiden   TREATMENT REGIMEN:  07/2018 - present: Eliquis 5mg  BID   ASSESSMENT & PLAN:   Unprovoked bilateral PTE -Patient is tolerating Eliquis well without any abnormal bleeding or bruising -I reviewed the guidelines on the management of VTE with the patient -Given the moderate burden of pulmonary embolism involving bilateral lungs, I discussed with the patient that I am in Perkins of continuing Eliquis 5mg  BID indefinitely for now rather than lowering the dose after she completes at least 6 months of anticoagulation  -Continue Eliquis 5mg  BID for now  -I reinforced the importance of preventive strategies such as avoiding hormonal supplement, avoiding cigarette smoking, keeping up-to-date with screening programs for early cancer detection, frequent ambulation for long distance travel and aggressive DVT prophylaxis in all surgical settings. -Should she need any interruption of the anticoagulation for elective procedures in the future, feel free to contact me regarding  peri-operative management.  Heterozygous prothrombin gene mutation and heterozygous Factor V leiden  -Noted during hypercoagulable work-up in 07/2018 -Patient's daughter also has been tested positive for prothrombin gene mutation and Factor V Leiden, and is on anticoagulation for hx of DVT in the RLE  -Continue anticoagulation as above   Orders Placed This Encounter  Procedures  . CBC with Differential (Cancer Center Only)    Standing Status:   Future    Standing Expiration Date:   01/31/2020  . CMP ([facility] only)    Standing Status:   Future    Standing Expiration Date:   01/31/2020   All questions were answered. The patient knows to call the clinic with any problems, questions or concerns. No barriers to learning was detected.  A total of more than 25 minutes were spent face-to-face with the patient during this encounter and over half of that time was spent on counseling and coordination of care as outlined above.   Return in 3 months for labs and clinic follow-up. If she continues to do well, we can see her q53months.   Tish Men, MD 12/27/2018 12:15 PM  CHIEF COMPLAINT: "I am doing fine"  INTERVAL HISTORY: Amy Perkins returns to clinic for follow-up of bilateral PTE.  Patient reports that since last visit, she has been doing well, and is tolerating Eliquis 5 mg twice daily without any abnormal bleeding or bruising.  She has been mostly confined to her home due to the COVID-19 outbreak.  She denies any other complaint today.  REVIEW OF SYSTEMS:   Constitutional: ( - ) fevers, ( - )  chills , ( - ) night sweats Eyes: ( - ) blurriness of vision, ( - ) double vision, ( - ) watery eyes Ears, nose, mouth, throat, and face: ( - ) mucositis, ( - )  sore throat Respiratory: ( - ) cough, ( - ) dyspnea, ( - ) wheezes Cardiovascular: ( - ) palpitation, ( - ) chest discomfort, ( - ) lower extremity swelling Gastrointestinal:  ( - ) nausea, ( - ) heartburn, ( - ) change in bowel  habits Skin: ( - ) abnormal skin rashes Lymphatics: ( - ) new lymphadenopathy, ( - ) easy bruising Neurological: ( - ) numbness, ( - ) tingling, ( - ) new weaknesses Behavioral/Psych: ( - ) mood change, ( - ) new changes  All other systems were reviewed with the patient and are negative.  I have reviewed the past medical history, past surgical history, social history and family history with the patient and they are unchanged from previous note.  ALLERGIES:  has No Known Allergies.  MEDICATIONS:  Current Outpatient Medications  Medication Sig Dispense Refill  . ALPRAZolam (XANAX) 0.5 MG tablet Take 0.5 mg by mouth See admin instructions. Take 1/2 tablet by mouth 2 times daily (morning and 3PM) , and 1 tablet at bedtime.    Marland Kitchen apixaban (ELIQUIS) 5 MG TABS tablet Take 5 mg by mouth daily.     . Biotin 1000 MCG CHEW Chew by mouth.    . citalopram (CELEXA) 40 MG tablet Take 1 tablet (40 mg total) by mouth daily. 30 tablet 5  . levothyroxine (SYNTHROID, LEVOTHROID) 50 MCG tablet TAKE 1 TABLET (50 MCG TOTAL) BY MOUTH DAILY. 90 tablet 1  . Multiple Vitamin (MULTIVITAMIN) tablet Take 1 tablet by mouth every evening.     . simvastatin (ZOCOR) 40 MG tablet TAKE 1 TABLET BY MOUTH AT BEDTIME 90 tablet 1  . topiramate (TOPAMAX) 50 MG tablet TAKE 1 TABLET BY MOUTH EVERY MORNING AND TAKE 2 TABLETS EVERY EVENING (Patient taking differently: Take 50-100 mg by mouth See admin instructions. TAKE 1 TABLET BY MOUTH EVERY MORNING AND TAKE 2 TABLETS EVERY EVENING) 270 tablet 3   No current facility-administered medications for this visit.     PHYSICAL EXAMINATION: ECOG PERFORMANCE STATUS: 0 - Asymptomatic  Today's Vitals   12/27/18 1058  BP: 109/78  Pulse: 66  Resp: 18  Temp: 97.9 F (36.6 C)  TempSrc: Oral  SpO2: 98%  Weight: 159 lb (72.1 kg)  Height: 5\' 4"  (1.626 m)  PainSc: 0-No pain   Body mass index is 27.29 kg/m.  Filed Weights   12/27/18 1058  Weight: 159 lb (72.1 kg)    GENERAL:  alert, no distress and comfortable SKIN: skin color, texture, turgor are normal, no rashes or significant lesions EYES: conjunctiva are pink and non-injected, sclera clear OROPHARYNX: no exudate, no erythema; lips, buccal mucosa, and tongue normal  NECK: supple, non-tender LUNGS: clear to auscultation with normal breathing effort HEART: regular rate & rhythm and no murmurs and no lower extremity edema ABDOMEN: soft, non-tender, non-distended, normal bowel sounds Musculoskeletal: no cyanosis of digits and no clubbing  PSYCH: alert & oriented x 3, fluent speech NEURO: no focal motor/sensory deficits  LABORATORY DATA:  I have reviewed the data as listed    Component Value Date/Time   NA 141 12/27/2018 1034   K 4.3 12/27/2018 1034   CL 107 12/27/2018 1034   CO2 28 12/27/2018 1034   GLUCOSE 79 12/27/2018 1034   GLUCOSE 56 02/08/2010   BUN 18 12/27/2018 1034   CREATININE 0.84 12/27/2018 1034   CALCIUM 10.0 12/27/2018 1034   PROT 6.4 (L) 12/27/2018 1034   ALBUMIN 4.4 12/27/2018 1034   AST 16 12/27/2018 1034  ALT 26 12/27/2018 1034   ALKPHOS 67 12/27/2018 1034   BILITOT 0.4 12/27/2018 1034   GFRNONAA >60 12/27/2018 1034   GFRAA >60 12/27/2018 1034    No results found for: SPEP, UPEP  Lab Results  Component Value Date   WBC 5.8 12/27/2018   NEUTROABS 3.3 12/27/2018   HGB 14.7 12/27/2018   HCT 44.0 12/27/2018   MCV 90.2 12/27/2018   PLT 196 12/27/2018      Chemistry      Component Value Date/Time   NA 141 12/27/2018 1034   K 4.3 12/27/2018 1034   CL 107 12/27/2018 1034   CO2 28 12/27/2018 1034   BUN 18 12/27/2018 1034   CREATININE 0.84 12/27/2018 1034      Component Value Date/Time   CALCIUM 10.0 12/27/2018 1034   ALKPHOS 67 12/27/2018 1034   AST 16 12/27/2018 1034   ALT 26 12/27/2018 1034   BILITOT 0.4 12/27/2018 1034

## 2018-12-31 ENCOUNTER — Telehealth: Payer: Self-pay | Admitting: Hematology

## 2018-12-31 NOTE — Telephone Encounter (Signed)
Appts scheduled letter/calendar mailed per 5/8 los

## 2019-01-01 ENCOUNTER — Telehealth: Payer: Self-pay | Admitting: Hematology

## 2019-01-01 NOTE — Telephone Encounter (Signed)
Appts scheduled letter/calendar mailed per 5/12 los °

## 2019-03-05 LAB — HM PAP SMEAR

## 2019-03-05 LAB — HM DEXA SCAN: HM Dexa Scan: NORMAL

## 2019-03-05 LAB — HM MAMMOGRAPHY

## 2019-03-28 ENCOUNTER — Encounter: Payer: Self-pay | Admitting: Hematology

## 2019-03-28 ENCOUNTER — Inpatient Hospital Stay: Payer: Medicare Other | Attending: Hematology

## 2019-03-28 ENCOUNTER — Other Ambulatory Visit: Payer: Self-pay

## 2019-03-28 ENCOUNTER — Telehealth: Payer: Self-pay | Admitting: Hematology

## 2019-03-28 ENCOUNTER — Inpatient Hospital Stay (HOSPITAL_BASED_OUTPATIENT_CLINIC_OR_DEPARTMENT_OTHER): Payer: Medicare Other | Admitting: Hematology

## 2019-03-28 VITALS — BP 100/57 | HR 71 | Temp 98.1°F | Resp 18 | Wt 158.0 lb

## 2019-03-28 DIAGNOSIS — Z79899 Other long term (current) drug therapy: Secondary | ICD-10-CM | POA: Insufficient documentation

## 2019-03-28 DIAGNOSIS — I2699 Other pulmonary embolism without acute cor pulmonale: Secondary | ICD-10-CM

## 2019-03-28 DIAGNOSIS — Z7901 Long term (current) use of anticoagulants: Secondary | ICD-10-CM | POA: Insufficient documentation

## 2019-03-28 DIAGNOSIS — D6851 Activated protein C resistance: Secondary | ICD-10-CM | POA: Insufficient documentation

## 2019-03-28 DIAGNOSIS — D6852 Prothrombin gene mutation: Secondary | ICD-10-CM | POA: Diagnosis not present

## 2019-03-28 LAB — CMP (CANCER CENTER ONLY)
ALT: 22 U/L (ref 0–44)
AST: 14 U/L — ABNORMAL LOW (ref 15–41)
Albumin: 4.1 g/dL (ref 3.5–5.0)
Alkaline Phosphatase: 59 U/L (ref 38–126)
Anion gap: 7 (ref 5–15)
BUN: 18 mg/dL (ref 8–23)
CO2: 26 mmol/L (ref 22–32)
Calcium: 8.8 mg/dL — ABNORMAL LOW (ref 8.9–10.3)
Chloride: 108 mmol/L (ref 98–111)
Creatinine: 0.72 mg/dL (ref 0.44–1.00)
GFR, Est AFR Am: >60 mL/min (ref >60)
GFR, Estimated: >60 mL/min (ref >60)
Glucose, Bld: 73 mg/dL (ref 70–99)
Potassium: 3.8 mmol/L (ref 3.5–5.1)
Sodium: 141 mmol/L (ref 135–145)
Total Bilirubin: 0.4 mg/dL (ref 0.3–1.2)
Total Protein: 6 g/dL — ABNORMAL LOW (ref 6.5–8.1)

## 2019-03-28 LAB — CBC WITH DIFFERENTIAL (CANCER CENTER ONLY)
Abs Immature Granulocytes: 0.02 10*3/uL (ref 0.00–0.07)
Basophils Absolute: 0 10*3/uL (ref 0.0–0.1)
Basophils Relative: 1 %
Eosinophils Absolute: 0.2 10*3/uL (ref 0.0–0.5)
Eosinophils Relative: 3 %
HCT: 43.5 % (ref 36.0–46.0)
Hemoglobin: 14.8 g/dL (ref 12.0–15.0)
Immature Granulocytes: 0 %
Lymphocytes Relative: 30 %
Lymphs Abs: 1.9 10*3/uL (ref 0.7–4.0)
MCH: 30.4 pg (ref 26.0–34.0)
MCHC: 34 g/dL (ref 30.0–36.0)
MCV: 89.3 fL (ref 80.0–100.0)
Monocytes Absolute: 0.5 10*3/uL (ref 0.1–1.0)
Monocytes Relative: 7 %
Neutro Abs: 3.8 10*3/uL (ref 1.7–7.7)
Neutrophils Relative %: 59 %
Platelet Count: 196 10*3/uL (ref 150–400)
RBC: 4.87 MIL/uL (ref 3.87–5.11)
RDW: 12.2 % (ref 11.5–15.5)
WBC Count: 6.3 10*3/uL (ref 4.0–10.5)
nRBC: 0 % (ref 0.0–0.2)

## 2019-03-28 NOTE — Progress Notes (Signed)
[facility] OFFICE PROGRESS NOTE  Patient Care Team: Midge Minium, MD as PCP - General [name], MD as Consulting Physician (Gastroenterology) Marcial Pacas, MD as Consulting Physician (Neurology) Dian Queen, MD as Consulting Physician (Obstetrics and Gynecology) [facility] Luberta Mutter, MD as Consulting Physician (Ophthalmology) Earley Favor (Dentistry) Cindie Crumbly (Dermatology) Tish Men, MD as Consulting Physician (Hematology) Milly Jakob, MD as Consulting Physician (Orthopedic Surgery)  HEME/ONC OVERVIEW: 1. Unprovoked bilateral PTE -End of 06/2018: CTA chest (for sudden onset CP, dyspnea) showed moderate burden PTE in the right interlobar pulmonary artery and additional bilateral PTE, no RV strain; normal cardiac function on echo; no DVT in lower extremities -07/2018 - present: Eliquis 5mg  BID  2. Heterozygous prothrombin gene mutation and heterozygous Factor V leiden   TREATMENT REGIMEN:  07/2018 - present: Eliquis 5mg  BID   ASSESSMENT & PLAN:   Unprovoked bilateral PTE -On Eliquis 5mg  BID without any abnormal bleeding or bruising -Goal of anticoagulation is lifelong, given the unprovoked nature of PTE, moderate clot burden and underlying heterozygous combined PT gene mutation/Factor V Leiden -Continue Eliquis 5mg  BID -I reinforced the importance of preventive strategies such as avoiding hormonal supplement, avoiding cigarette smoking, keeping up-to-date with screening programs for early cancer detection, frequent ambulation for long distance travel and aggressive DVT prophylaxis in all surgical settings. -Should she need any interruption of the anticoagulation for elective procedures in the future, feel free to contact me regarding peri-operative management.  Heterozygous prothrombin gene mutation and heterozygous Factor V leiden  -Noted during hypercoagulable work-up in 07/2018 -Continue anticoagulation as above   Orders  Placed This Encounter  Procedures  . CBC with Differential (Cancer Center Only)    Standing Status:   Future    Standing Expiration Date:   05/01/2020  . CMP ([facility] only)    Standing Status:   Future    Standing Expiration Date:   05/01/2020    All questions were answered. The patient knows to call the clinic with any problems, questions or concerns. No barriers to learning was detected.  A total of more than 25 minutes were spent face-to-face with the patient during this encounter and over half of that time was spent on counseling and coordination of care as outlined above.   Return in 6 months for labs and clinic appt.   Tish Men, MD 03/28/2019 11:54 AM  CHIEF COMPLAINT: "I am doing fine"  INTERVAL HISTORY: Ms. Pitcher returns to clinic for follow-up of bilateral PTE on Eliquis.  Patient reports that she has been doing well since last visit, and denies any abnormal bleeding or bruising on Eliquis.  She remains very active and denies pain today.   REVIEW OF SYSTEMS:   Constitutional: ( - ) fevers, ( - )  chills , ( - ) night sweats Eyes: ( - ) blurriness of vision, ( - ) double vision, ( - ) watery eyes Ears, nose, mouth, throat, and face: ( - ) mucositis, ( - ) sore throat Respiratory: ( - ) cough, ( - ) dyspnea, ( - ) wheezes Cardiovascular: ( - ) palpitation, ( - ) chest discomfort, ( - ) lower extremity swelling Gastrointestinal:  ( - ) nausea, ( - ) heartburn, ( - ) change in bowel habits Skin: ( - ) abnormal skin rashes Lymphatics: ( - ) new lymphadenopathy, ( - ) easy bruising Neurological: ( - ) numbness, ( - ) tingling, ( - ) new weaknesses Behavioral/Psych: ( - ) mood change, ( - )  new changes  All other systems were reviewed with the patient and are negative.  I have reviewed the past medical history, past surgical history, social history and family history with the patient and they are unchanged from previous note.  ALLERGIES:  is allergic to  other.  MEDICATIONS:  Current Outpatient Medications  Medication Sig Dispense Refill  . ALPRAZolam (XANAX) 0.5 MG tablet Take 0.5 mg by mouth See admin instructions. Take 1/2 tablet by mouth 2 times daily (morning and 3PM) , and 1 tablet at bedtime.    Marland Kitchen apixaban (ELIQUIS) 5 MG TABS tablet Take 5 mg by mouth daily.     . Biotin 1000 MCG CHEW Chew by mouth.    . citalopram (CELEXA) 40 MG tablet Take 1 tablet (40 mg total) by mouth daily. 30 tablet 5  . levothyroxine (SYNTHROID, LEVOTHROID) 50 MCG tablet TAKE 1 TABLET (50 MCG TOTAL) BY MOUTH DAILY. 90 tablet 1  . Multiple Vitamin (MULTIVITAMIN) tablet Take 1 tablet by mouth every evening.     . simvastatin (ZOCOR) 40 MG tablet TAKE 1 TABLET BY MOUTH AT BEDTIME 90 tablet 1  . topiramate (TOPAMAX) 50 MG tablet TAKE 1 TABLET BY MOUTH EVERY MORNING AND TAKE 2 TABLETS EVERY EVENING (Patient taking differently: Take 50-100 mg by mouth See admin instructions. TAKE 1 TABLET BY MOUTH EVERY MORNING AND TAKE 2 TABLETS EVERY EVENING) 270 tablet 3   No current facility-administered medications for this visit.     PHYSICAL EXAMINATION: ECOG PERFORMANCE STATUS: 1 - Symptomatic but completely ambulatory  Today's Vitals   03/28/19 1101  BP: (!) 100/57  Pulse: 71  Resp: 18  Temp: 98.1 F (36.7 C)  TempSrc: Oral  SpO2: 98%  Weight: 158 lb (71.7 kg)   Body mass index is 27.12 kg/m.  Filed Weights   03/28/19 1101  Weight: 158 lb (71.7 kg)    GENERAL: alert, no distress and comfortable SKIN: skin color, texture, turgor are normal, no rashes or significant lesions EYES: conjunctiva are pink and non-injected, sclera clear OROPHARYNX: no exudate, no erythema; lips, buccal mucosa, and tongue normal  NECK: supple, non-tender LUNGS: clear to auscultation with normal breathing effort HEART: regular rate & rhythm and no murmurs and no lower extremity edema ABDOMEN: soft, non-tender, non-distended, normal bowel sounds Musculoskeletal: no cyanosis of  digits and no clubbing  PSYCH: alert & oriented x 3, fluent speech NEURO: no focal motor/sensory deficits  LABORATORY DATA:  I have reviewed the data as listed    Component Value Date/Time   NA 141 03/28/2019 1035   K 3.8 03/28/2019 1035   CL 108 03/28/2019 1035   CO2 26 03/28/2019 1035   GLUCOSE 73 03/28/2019 1035   GLUCOSE 56 02/08/2010   BUN 18 03/28/2019 1035   CREATININE 0.72 03/28/2019 1035   CALCIUM 8.8 (L) 03/28/2019 1035   PROT 6.0 (L) 03/28/2019 1035   ALBUMIN 4.1 03/28/2019 1035   AST 14 (L) 03/28/2019 1035   ALT 22 03/28/2019 1035   ALKPHOS 59 03/28/2019 1035   BILITOT 0.4 03/28/2019 1035   GFRNONAA >60 03/28/2019 1035   GFRAA >60 03/28/2019 1035    No results found for: SPEP, UPEP  Lab Results  Component Value Date   WBC 6.3 03/28/2019   NEUTROABS 3.8 03/28/2019   HGB 14.8 03/28/2019   HCT 43.5 03/28/2019   MCV 89.3 03/28/2019   PLT 196 03/28/2019      Chemistry      Component Value Date/Time   NA 141  03/28/2019 1035   K 3.8 03/28/2019 1035   CL 108 03/28/2019 1035   CO2 26 03/28/2019 1035   BUN 18 03/28/2019 1035   CREATININE 0.72 03/28/2019 1035      Component Value Date/Time   CALCIUM 8.8 (L) 03/28/2019 1035   ALKPHOS 59 03/28/2019 1035   AST 14 (L) 03/28/2019 1035   ALT 22 03/28/2019 1035   BILITOT 0.4 03/28/2019 1035       RADIOGRAPHIC STUDIES: I have personally reviewed the radiological images as listed below and agreed with the findings in the report. No results found.

## 2019-03-28 NOTE — Telephone Encounter (Signed)
Called spoke with patient regarding appointments added per 8/7 los

## 2019-04-15 ENCOUNTER — Encounter: Payer: Self-pay | Admitting: Family Medicine

## 2019-04-15 ENCOUNTER — Ambulatory Visit: Payer: Medicare Other | Admitting: Family Medicine

## 2019-04-15 ENCOUNTER — Other Ambulatory Visit: Payer: Self-pay

## 2019-04-15 VITALS — BP 110/82 | HR 66 | Temp 98.1°F | Resp 16 | Ht 64.0 in | Wt 158.0 lb

## 2019-04-15 DIAGNOSIS — K5900 Constipation, unspecified: Secondary | ICD-10-CM | POA: Diagnosis not present

## 2019-04-15 DIAGNOSIS — E785 Hyperlipidemia, unspecified: Secondary | ICD-10-CM | POA: Diagnosis not present

## 2019-04-15 DIAGNOSIS — E038 Other specified hypothyroidism: Secondary | ICD-10-CM | POA: Diagnosis not present

## 2019-04-15 DIAGNOSIS — Z23 Encounter for immunization: Secondary | ICD-10-CM | POA: Diagnosis not present

## 2019-04-15 LAB — TSH: TSH: 2.44 u[IU]/mL (ref 0.35–4.50)

## 2019-04-15 LAB — HEPATIC FUNCTION PANEL
ALT: 20 U/L (ref 0–35)
AST: 14 U/L (ref 0–37)
Albumin: 4.5 g/dL (ref 3.5–5.2)
Alkaline Phosphatase: 64 U/L (ref 39–117)
Bilirubin, Direct: 0.1 mg/dL (ref 0.0–0.3)
Total Bilirubin: 0.4 mg/dL (ref 0.2–1.2)
Total Protein: 6.2 g/dL (ref 6.0–8.3)

## 2019-04-15 LAB — LIPID PANEL
Cholesterol: 188 mg/dL (ref 0–200)
HDL: 45.9 mg/dL (ref >39.00)
LDL Cholesterol: 108 mg/dL — ABNORMAL HIGH (ref 0–99)
NonHDL: 141.87
Total CHOL/HDL Ratio: 4
Triglycerides: 171 mg/dL — ABNORMAL HIGH (ref 0.0–149.0)
VLDL: 34.2 mg/dL (ref 0.0–40.0)

## 2019-04-15 LAB — CBC WITH DIFFERENTIAL/PLATELET
Basophils Absolute: 0 10*3/uL (ref 0.0–0.1)
Basophils Relative: 0.4 % (ref 0.0–3.0)
Eosinophils Absolute: 0.2 10*3/uL (ref 0.0–0.7)
Eosinophils Relative: 3.1 % (ref 0.0–5.0)
HCT: 45.8 % (ref 36.0–46.0)
Hemoglobin: 15.3 g/dL — ABNORMAL HIGH (ref 12.0–15.0)
Lymphocytes Relative: 28.9 % (ref 12.0–46.0)
Lymphs Abs: 1.8 10*3/uL (ref 0.7–4.0)
MCHC: 33.5 g/dL (ref 30.0–36.0)
MCV: 91.3 fl (ref 78.0–100.0)
Monocytes Absolute: 0.4 10*3/uL (ref 0.1–1.0)
Monocytes Relative: 7 % (ref 3.0–12.0)
Neutro Abs: 3.7 10*3/uL (ref 1.4–7.7)
Neutrophils Relative %: 60.6 % (ref 43.0–77.0)
Platelets: 207 10*3/uL (ref 150.0–400.0)
RBC: 5.02 Mil/uL (ref 3.87–5.11)
RDW: 13.2 % (ref 11.5–15.5)
WBC: 6.2 10*3/uL (ref 4.0–10.5)

## 2019-04-15 LAB — BASIC METABOLIC PANEL
BUN: 16 mg/dL (ref 6–23)
CO2: 27 mEq/L (ref 19–32)
Calcium: 9.6 mg/dL (ref 8.4–10.5)
Chloride: 108 mEq/L (ref 96–112)
Creatinine, Ser: 0.78 mg/dL (ref 0.40–1.20)
GFR: 73.19 mL/min (ref >60.00)
Glucose, Bld: 114 mg/dL — ABNORMAL HIGH (ref 70–99)
Potassium: 4.2 mEq/L (ref 3.5–5.1)
Sodium: 142 mEq/L (ref 135–145)

## 2019-04-15 NOTE — Assessment & Plan Note (Signed)
Chronic problem.  Tolerating statin w/o difficulty.  Check labs.  Adjust meds prn  

## 2019-04-15 NOTE — Progress Notes (Signed)
   Subjective:    Patient ID: Amy Perkins, female    DOB: 11-25-1950, 68 y.o.   MRN: [id number]  HPI Hyperlipidemia- chronic problem, on Simvastatin 40mg  daily.  Exercising regularly.  Denies CP, SOB, HAs, visual changes, abd pain, N/V.  Hypothyroid- pt is currently on Levothyroxine 72mcg daily.  Denies excessive fatigue.  Frustrated by lack of weight loss.  Constipation- intermittent, denies hard stools or excessive straining.  'I just don't go every day like i'm supposed to'   Review of Systems For ROS see HPI     Objective:   Physical Exam Vitals signs reviewed.  Constitutional:      General: She is not in acute distress.    Appearance: She is well-developed.  HENT:     Head: Normocephalic and atraumatic.  Eyes:     Conjunctiva/sclera: Conjunctivae normal.     Pupils: Pupils are equal, round, and reactive to light.  Neck:     Musculoskeletal: Normal range of motion and neck supple.     Thyroid: No thyromegaly.  Cardiovascular:     Rate and Rhythm: Normal rate and regular rhythm.     Heart sounds: Normal heart sounds. No murmur.  Pulmonary:     Effort: Pulmonary effort is normal. No respiratory distress.     Breath sounds: Normal breath sounds.  Abdominal:     General: There is no distension.     Palpations: Abdomen is soft.     Tenderness: There is no abdominal tenderness.  Lymphadenopathy:     Cervical: No cervical adenopathy.  Skin:    General: Skin is warm and dry.  Neurological:     Mental Status: She is alert and oriented to person, place, and time.  Psychiatric:        Behavior: Behavior normal.           Assessment & Plan:  Constipation- new.  Intermittent.  Discussed addition of fiber, miralax prn.  Applauded her regular exercise efforts.  Will follow.

## 2019-04-15 NOTE — Patient Instructions (Addendum)
Schedule your complete physical in 6 months We'll notify you of your lab results and make any changes if needed Keep up the good work on healthy diet and regular exercise- you look great! Call with any questions or concerns Stay Safe!!! 

## 2019-04-15 NOTE — Assessment & Plan Note (Signed)
Chronic problem.  Currently asymptomatic.  Check labs.  Adjust meds prn  

## 2019-04-16 ENCOUNTER — Other Ambulatory Visit (INDEPENDENT_AMBULATORY_CARE_PROVIDER_SITE_OTHER): Payer: Medicare Other

## 2019-04-16 DIAGNOSIS — R7309 Other abnormal glucose: Secondary | ICD-10-CM

## 2019-04-16 LAB — HEMOGLOBIN A1C: Hgb A1c MFr Bld: 6.1 % (ref 4.6–6.5)

## 2019-05-10 ENCOUNTER — Other Ambulatory Visit: Payer: Self-pay | Admitting: Family Medicine

## 2019-05-30 ENCOUNTER — Other Ambulatory Visit: Payer: Self-pay | Admitting: Family Medicine

## 2019-06-19 ENCOUNTER — Ambulatory Visit: Payer: Medicare Other | Admitting: Neurology

## 2019-07-15 ENCOUNTER — Other Ambulatory Visit: Payer: Self-pay | Admitting: Family Medicine

## 2019-07-16 NOTE — Telephone Encounter (Signed)
Please advise Medication previously prescribed by Dr. Tish Men

## 2019-07-21 ENCOUNTER — Other Ambulatory Visit: Payer: Self-pay

## 2019-07-21 ENCOUNTER — Telehealth: Payer: Self-pay | Admitting: Family Medicine

## 2019-07-21 MED ORDER — APIXABAN 5 MG PO TABS: ORAL_TABLET | ORAL | 0 refills | Status: DC

## 2019-07-21 NOTE — Telephone Encounter (Signed)
I will give #60, no refills but this needs to come from Dr Maylon Peppers in the future

## 2019-07-21 NOTE — Telephone Encounter (Signed)
Called and left a detailed message to advise pt of PCP recommendation to have refill come from hematology.

## 2019-07-21 NOTE — Telephone Encounter (Signed)
This medication is filled by Dr Maylon Peppers at hematology.  This is likely why it was not refilled last week by our office

## 2019-07-21 NOTE — Telephone Encounter (Signed)
Patient called to follow up on refill request for eliquis. She said that she did call last week about this and she said that she will be taking her last two pill tomorrow. She is aware that we were closed for the holiday but she said that she called about this refill on last Tuesday.

## 2019-07-21 NOTE — Telephone Encounter (Signed)
Please advise it appears a phone note was sent to you on  11/25

## 2019-08-11 ENCOUNTER — Other Ambulatory Visit: Payer: Self-pay | Admitting: *Deleted

## 2019-08-11 NOTE — Telephone Encounter (Signed)
Noted removal from her medlist as her migraines improved.  Will not refill at this time unless pt calls.  Has appt in January with Dr. Krista Blue.

## 2019-08-13 ENCOUNTER — Other Ambulatory Visit: Payer: Self-pay | Admitting: *Deleted

## 2019-08-13 MED ORDER — TOPIRAMATE 50 MG PO TABS: ORAL_TABLET | ORAL | 0 refills | Status: DC

## 2019-08-16 ENCOUNTER — Other Ambulatory Visit: Payer: Self-pay | Admitting: Neurology

## 2019-08-20 ENCOUNTER — Ambulatory Visit: Payer: Medicare Other | Admitting: Neurology

## 2019-08-26 ENCOUNTER — Ambulatory Visit: Payer: Medicare PPO | Admitting: Neurology

## 2019-08-26 ENCOUNTER — Encounter: Payer: Self-pay | Admitting: Neurology

## 2019-08-26 ENCOUNTER — Other Ambulatory Visit: Payer: Self-pay

## 2019-08-26 VITALS — BP 130/88 | HR 64 | Temp 96.9°F | Ht 64.0 in | Wt 164.2 lb

## 2019-08-26 DIAGNOSIS — G43009 Migraine without aura, not intractable, without status migrainosus: Secondary | ICD-10-CM

## 2019-08-26 NOTE — Progress Notes (Signed)
[facility]  PATIENT: Amy Perkins DOB: 1950/11/13  HISTORY OF PRESENT ILLNESS: She has past medical history of migraine , anxiety and hyperlipidemia and depression, she is a retired Education officer, museum. Since January 2014, without clear trigger event, she began to have constant low grade 2 out of 10 throbbing headaches, started at left parietal region, also involve both parietal region, during last months, she also had 2 episodes of her typical migraine, blurry vision, followed by light sensitivity, noise sensitivity, mild nausea, low-grade headache, worsening by movement, relieved by resting, and take ibuprofen.   She denies jaw claudication, no chewing difficulty, no diffuse muscle achy pain   She had 3 episodes of migraine headache since last visit in September 27 2012, related to her sinus infection, relieved by ibuprofen, Tylenol in 30 minutes, while she was taking topiramate 25 mg 2 tablets twice a day, her headache has much improved, only intermittent mild left parietal area throbbing low-grade headaches, no significant side effect  She has a history of migraines that are in good control with Topamax. She has not had to use for Maxalt very much. She has a new complaint today of daytime sleepiness. She snores at night but is not sure if she quits breathing or not. She has a recent injury to her left hand for which she received a steroid injection last month and continues to have pain over the left thumb area. She returns for reevaluation   UPDATE Jan 5th 2021: She is doing well, has rare short lasting headaches, has tapered off Topamax since Nov 2020, there is no significant increase of her migraine headaches, she is taking ibuprofen once every couple weeks as needed  REVIEW OF SYSTEMS: Full 14 system review of systems performed and notable only for those listed, all others are neg:      ALLERGIES: Allergies  Allergen Reactions  . Other     HOME  MEDICATIONS: Outpatient Medications Prior to Visit  Medication Sig Dispense Refill  . ALPRAZolam (XANAX) 0.5 MG tablet Take 0.5 mg by mouth See admin instructions. Take 1/2 tablet by mouth 2 times daily (morning and 3PM) , and 1 tablet at bedtime.    Marland Kitchen apixaban (ELIQUIS) 5 MG TABS tablet TAKE 1 TABLET (5 MG TOTAL) BY MOUTH 2 (TWO) TIMES DAILY. 60 tablet 0  . Biotin 1000 MCG CHEW Chew by mouth.    . citalopram (CELEXA) 40 MG tablet Take 1 tablet (40 mg total) by mouth daily. 30 tablet 5  . levothyroxine (SYNTHROID) 50 MCG tablet TAKE 1 TABLET BY MOUTH EVERY DAY 90 tablet 1  . Multiple Vitamin (MULTIVITAMIN) tablet Take 1 tablet by mouth every evening.     . simvastatin (ZOCOR) 40 MG tablet TAKE 1 TABLET BY MOUTH EVERYDAY AT BEDTIME 90 tablet 1  . topiramate (TOPAMAX) 50 MG tablet TAKE 1 TABLET BY MOUTH EVERY MORNING AND TAKE 2 TABLETS EVERY EVENING (Patient not taking: Reported on 08/26/2019) 270 tablet 0   No facility-administered medications prior to visit.    PAST MEDICAL HISTORY: Past Medical History:  Diagnosis Date  . Depression   . Hyperlipidemia   . Migraines   . Squamous cell carcinoma 09/20/2017   chest    PAST SURGICAL HISTORY: Past Surgical History:  Procedure Laterality Date  . DILATION AND CURETTAGE OF UTERUS     x2  . laser vein surgery    . TONSILLECTOMY      FAMILY HISTORY: Family History  Problem Relation Age of Onset  .  Coronary artery disease Father   . Stroke Father   . Parkinson's disease Father   . Hypertension Mother   . Diabetes Mother   . Stroke Mother   . Coronary artery disease Brother   . Factor V Leiden deficiency Daughter     SOCIAL HISTORY: Social History   Socioeconomic History  . Marital status: Divorced    Spouse name: Not on file  . Number of children: 2  . Years of education: college  . Highest education level: Not on file  Occupational History  . Occupation: Product manager: [employer]    Comment: [employer]  Tobacco Use   . Smoking status: Never Smoker  . Smokeless tobacco: Never Used  Substance and Sexual Activity  . Alcohol use: Yes    Alcohol/week: 4.0 standard drinks    Types: 4 Glasses of wine per week    Comment: 4 glass week  . Drug use: No  . Sexual activity: Never  Other Topics Concern  . Not on file  Social History Narrative   Divorced, both children live locally.   Patient works part time at [employer]. College education.   Left handed.   Caffeine- two cups coffee daily.   Social Determinants of Health   Financial Resource Strain:   . Difficulty of Paying Living Expenses: Not on file  Food Insecurity:   . Worried About Charity fundraiser in the Last Year: Not on file  . Ran Out of Food in the Last Year: Not on file  Transportation Needs:   . Lack of Transportation (Medical): Not on file  . Lack of Transportation (Non-Medical): Not on file  Physical Activity:   . Days of Exercise per Week: Not on file  . Minutes of Exercise per Session: Not on file  Stress:   . Feeling of Stress : Not on file  Social Connections:   . Frequency of Communication with Friends and Family: Not on file  . Frequency of Social Gatherings with Friends and Family: Not on file  . Attends Religious Services: Not on file  . Active Member of Clubs or Organizations: Not on file  . Attends Archivist Meetings: Not on file  . Marital Status: Not on file  Intimate Partner Violence:   . Fear of Current or Ex-Partner: Not on file  . Emotionally Abused: Not on file  . Physically Abused: Not on file  . Sexually Abused: Not on file     PHYSICAL EXAM  Vitals:   08/26/19 1254  BP: 130/88  Pulse: 64  Temp: (!) 96.9 F (36.1 C)  Weight: 164 lb 3.2 oz (74.5 kg)  Height: 5\' 4"  (1.626 m)   Body mass index is 28.18 kg/m. Generalized: Well developed, in no acute distress  Head: normocephalic and atraumatic,. Oropharynx benign  Neck: Supple, Musculoskeletal: No deformity   Neurological  examination   Mentation: Alert oriented to time, place, history taking. Attention span and concentration appropriate. Recent and remote memory intact. Follows all commands speech and language fluent.   Cranial nerve II-XII: Pupils were equal round reactive to light extraocular movements were full, visual field were full on confrontational test. Facial sensation and strength were normal. hearing was intact to casual conversation.  Head turning and shoulder shrug were normal and symmetric. Motor: normal bulk and tone, full strength in the BUE, BLE,  Coordination: finger-nose-finger, heel-to-shin bilaterally, no dysmetria Reflexes: Symmetric upper and lower, plantar responses were flexor bilaterally. Gait and Station: Rising up from  seated position without assistance, normal stance, moderate stride, good arm swing, smooth turning  DIAGNOSTIC DATA (LABS, IMAGING, TESTING) - I reviewed patient records, labs, notes, testing and imaging myself where available.      Component Value Date/Time   NA 142 04/15/2019 1003   K 4.2 04/15/2019 1003   CL 108 04/15/2019 1003   CO2 27 04/15/2019 1003   GLUCOSE 114 (H) 04/15/2019 1003   GLUCOSE 56 02/08/2010 0000   BUN 16 04/15/2019 1003   CREATININE 0.78 04/15/2019 1003   CREATININE 0.72 03/28/2019 1035   CALCIUM 9.6 04/15/2019 1003   PROT 6.2 04/15/2019 1003   ALBUMIN 4.5 04/15/2019 1003   AST 14 04/15/2019 1003   AST 14 (L) 03/28/2019 1035   ALT 20 04/15/2019 1003   ALT 22 03/28/2019 1035   ALKPHOS 64 04/15/2019 1003   BILITOT 0.4 04/15/2019 1003   BILITOT 0.4 03/28/2019 1035   GFRNONAA >60 03/28/2019 1035   GFRAA >60 03/28/2019 1035   Lab Results  Component Value Date   CHOL 188 04/15/2019   HDL 45.90 04/15/2019   LDLCALC 108 (H) 04/15/2019   TRIG 171.0 (H) 04/15/2019   CHOLHDL 4 04/15/2019    ASSESSMENT AND PLAN  69 y.o. year old female   Chronic migraine headaches  Overall has much improved  Has tapered off preventive  medication Topamax  Only taking ibuprofen occasionally  Return to clinic if new issues arise  Marcial Pacas, M.D. Ph.D.  [facility] [place], [place] [address] Phone: [phone] Fax:      [phone]

## 2019-09-08 ENCOUNTER — Other Ambulatory Visit: Payer: Self-pay | Admitting: *Deleted

## 2019-09-08 MED ORDER — APIXABAN 5 MG PO TABS: ORAL_TABLET | ORAL | 1 refills | Status: DC

## 2019-09-18 ENCOUNTER — Ambulatory Visit: Payer: Medicare Other

## 2019-09-26 ENCOUNTER — Inpatient Hospital Stay (HOSPITAL_BASED_OUTPATIENT_CLINIC_OR_DEPARTMENT_OTHER): Payer: Medicare PPO | Admitting: Hematology

## 2019-09-26 ENCOUNTER — Encounter: Payer: Self-pay | Admitting: Hematology

## 2019-09-26 ENCOUNTER — Other Ambulatory Visit: Payer: Self-pay

## 2019-09-26 ENCOUNTER — Inpatient Hospital Stay: Payer: Medicare PPO | Attending: Hematology

## 2019-09-26 VITALS — BP 123/71 | HR 63 | Temp 97.3°F | Resp 18 | Wt 161.0 lb

## 2019-09-26 DIAGNOSIS — I2699 Other pulmonary embolism without acute cor pulmonale: Secondary | ICD-10-CM

## 2019-09-26 DIAGNOSIS — D6851 Activated protein C resistance: Secondary | ICD-10-CM | POA: Insufficient documentation

## 2019-09-26 DIAGNOSIS — Z7901 Long term (current) use of anticoagulants: Secondary | ICD-10-CM | POA: Insufficient documentation

## 2019-09-26 DIAGNOSIS — Z86718 Personal history of other venous thrombosis and embolism: Secondary | ICD-10-CM | POA: Insufficient documentation

## 2019-09-26 DIAGNOSIS — Z79899 Other long term (current) drug therapy: Secondary | ICD-10-CM | POA: Insufficient documentation

## 2019-09-26 DIAGNOSIS — E162 Hypoglycemia, unspecified: Secondary | ICD-10-CM | POA: Insufficient documentation

## 2019-09-26 DIAGNOSIS — D6852 Prothrombin gene mutation: Secondary | ICD-10-CM | POA: Insufficient documentation

## 2019-09-26 LAB — CMP (CANCER CENTER ONLY)
ALT: 21 U/L (ref 0–44)
AST: 15 U/L (ref 15–41)
Albumin: 4.3 g/dL (ref 3.5–5.0)
Alkaline Phosphatase: 57 U/L (ref 38–126)
Anion gap: 4 — ABNORMAL LOW (ref 5–15)
BUN: 16 mg/dL (ref 8–23)
CO2: 32 mmol/L (ref 22–32)
Calcium: 9.6 mg/dL (ref 8.9–10.3)
Chloride: 104 mmol/L (ref 98–111)
Creatinine: 0.73 mg/dL (ref 0.44–1.00)
GFR, Est AFR Am: >60 mL/min (ref >60)
GFR, Estimated: >60 mL/min (ref >60)
Glucose, Bld: 54 mg/dL — ABNORMAL LOW (ref 70–99)
Potassium: 4.5 mmol/L (ref 3.5–5.1)
Sodium: 140 mmol/L (ref 135–145)
Total Bilirubin: 0.4 mg/dL (ref 0.3–1.2)
Total Protein: 6.3 g/dL — ABNORMAL LOW (ref 6.5–8.1)

## 2019-09-26 LAB — CBC WITH DIFFERENTIAL (CANCER CENTER ONLY)
Abs Immature Granulocytes: 0.02 10*3/uL (ref 0.00–0.07)
Basophils Absolute: 0 10*3/uL (ref 0.0–0.1)
Basophils Relative: 1 %
Eosinophils Absolute: 0.2 10*3/uL (ref 0.0–0.5)
Eosinophils Relative: 3 %
HCT: 43.6 % (ref 36.0–46.0)
Hemoglobin: 14.8 g/dL (ref 12.0–15.0)
Immature Granulocytes: 0 %
Lymphocytes Relative: 31 %
Lymphs Abs: 1.9 10*3/uL (ref 0.7–4.0)
MCH: 30.2 pg (ref 26.0–34.0)
MCHC: 33.9 g/dL (ref 30.0–36.0)
MCV: 89 fL (ref 80.0–100.0)
Monocytes Absolute: 0.6 10*3/uL (ref 0.1–1.0)
Monocytes Relative: 9 %
Neutro Abs: 3.5 10*3/uL (ref 1.7–7.7)
Neutrophils Relative %: 56 %
Platelet Count: 212 10*3/uL (ref 150–400)
RBC: 4.9 MIL/uL (ref 3.87–5.11)
RDW: 12 % (ref 11.5–15.5)
WBC Count: 6.2 10*3/uL (ref 4.0–10.5)
nRBC: 0 % (ref 0.0–0.2)

## 2019-09-26 NOTE — Progress Notes (Signed)
[place] OFFICE PROGRESS NOTE  Patient Care Team: Midge Minium, MD as PCP - General [name], MD as Consulting Physician (Gastroenterology) Marcial Pacas, MD as Consulting Physician (Neurology) Dian Queen, MD as Consulting Physician (Obstetrics and Gynecology) Center, Skin Surgery Luberta Mutter, MD as Consulting Physician (Ophthalmology) Earley Favor (Dentistry) Cindie Crumbly (Dermatology) Tish Men, MD as Consulting Physician (Hematology) Milly Jakob, MD as Consulting Physician (Orthopedic Surgery)  HEME/ONC OVERVIEW: 1. Unprovoked bilateral PTE -End of 06/2018:   Moderate burden PTE in the right interlobar pulmonary artery and additional bilateral PTE, no RV strain  Normal cardiac function on echo  No DVT in lower extremities -07/2018 - present: Eliquis 5mg  BID; lifelong anticoagulation  2. Heterozygous prothrombin gene mutation and heterozygous Factor V leiden   TREATMENT REGIMEN:  07/2018 - present: Eliquis 5mg  BID; goal of anticoagulation lifelong   ASSESSMENT & PLAN:   Unprovoked bilateral PTE -On Eliquis 5mg  BID without any abnormal bleeding or bruising -Goal of anticoagulation is lifelong, given the unprovoked nature of PTE, moderate clot burden and underlying heterozygous combined PT gene mutation/Factor V Leiden.  No dose reduction for secondary prophylaxis due to the moderate PTE burden.  -Continue Eliquis 5mg  BID -I reinforced the importance of preventive strategies such as avoiding hormonal supplement, avoiding cigarette smoking, keeping up-to-date with screening programs for early cancer detection, frequent ambulation for long distance travel and aggressive DVT prophylaxis in all surgical settings. -Should she need any interruption of the anticoagulation for elective procedures in the future, feel free to contact me regarding peri-operative management.  Heterozygous prothrombin gene mutation and heterozygous Factor V leiden   -Noted during hypercoagulable work-up in 07/2018 -Continue anticoagulation as above   Borderline hypoglycemia -Patient reports chronic, intermittent, mild hypoglycemia for many years; no prior evaluation -Glucose 54 today, patient is asymptomatic -We provided the patient with apple juice, and discussed some of the preventive measures, including having protein bars with her in the event that she develops symptoms related to hypoglycemia -She has appt with her PCP next month, and I encouraged her to discuss with PCP regarding endocrinology referral for further evaluation of hypoglycemia   No orders of the defined types were placed in this encounter.  The total time spent in the encounter was 32 minutes, including face-to-face time with the patient, review of various tests results, order additional studies/medications, documentation, and coordination of care plan.   All questions were answered. The patient knows to call the clinic with any problems, questions or concerns. No barriers to learning was detected.  Return in 1 year for labs and clinic follow-up.   Tish Men, MD 2/5/202111:50 AM  CHIEF COMPLAINT: "I am doing fine"  INTERVAL HISTORY: Amy Perkins returns clinic for follow-up of history of unprovoked bilateral PTE on Eliquis.  Patient reports that she has been tolerating anticoagulation well, and the denies any abnormal bleeding or excess bruising.  Her glucose was 54 in clinic today, but she denies any symptoms associated with hypoglycemia, such as dizziness, lightheadedness, or diaphoresis.  Patient reports that she has had chronic, mild, intermittent hypoglycemia for "many years", but she has not had prior evaluation.  She is scheduled to see her PCP next month.  She is also due for her colonoscopy sometime this year.  She denies any other complaint today.  REVIEW OF SYSTEMS:   Constitutional: ( - ) fevers, ( - )  chills , ( - ) night sweats Eyes: ( - ) blurriness of vision, ( -  ) double  vision, ( - ) watery eyes Ears, nose, mouth, throat, and face: ( - ) mucositis, ( - ) sore throat Respiratory: ( - ) cough, ( - ) dyspnea, ( - ) wheezes Cardiovascular: ( - ) palpitation, ( - ) chest discomfort, ( - ) lower extremity swelling Gastrointestinal:  ( - ) nausea, ( - ) heartburn, ( - ) change in bowel habits Skin: ( - ) abnormal skin rashes Lymphatics: ( - ) new lymphadenopathy, ( - ) easy bruising Neurological: ( - ) numbness, ( - ) tingling, ( - ) new weaknesses Behavioral/Psych: ( - ) mood change, ( - ) new changes  All other systems were reviewed with the patient and are negative.  SUMMARY OF ONCOLOGIC HISTORY: Oncology History   No history exists.    I have reviewed the past medical history, past surgical history, social history and family history with the patient and they are unchanged from previous note.  ALLERGIES:  is allergic to other.  MEDICATIONS:  Current Outpatient Medications  Medication Sig Dispense Refill  . ALPRAZolam (XANAX) 0.5 MG tablet Take 0.5 mg by mouth See admin instructions. Take 1/2 tablet by mouth 2 times daily (morning and 3PM) , and 1 tablet at bedtime.    Marland Kitchen apixaban (ELIQUIS) 5 MG TABS tablet TAKE 1 TABLET (5 MG TOTAL) BY MOUTH 2 (TWO) TIMES DAILY. 60 tablet 1  . Biotin 1000 MCG CHEW Chew by mouth.    . citalopram (CELEXA) 40 MG tablet Take 1 tablet (40 mg total) by mouth daily. 30 tablet 5  . levothyroxine (SYNTHROID) 50 MCG tablet TAKE 1 TABLET BY MOUTH EVERY DAY 90 tablet 1  . Multiple Vitamin (MULTIVITAMIN) tablet Take 1 tablet by mouth every evening.     . simvastatin (ZOCOR) 40 MG tablet TAKE 1 TABLET BY MOUTH EVERYDAY AT BEDTIME 90 tablet 1  . topiramate (TOPAMAX) 50 MG tablet TAKE 1 TABLET BY MOUTH EVERY MORNING AND TAKE 2 TABLETS EVERY EVENING (Patient not taking: Reported on 08/26/2019) 270 tablet 0   No current facility-administered medications for this visit.    PHYSICAL EXAMINATION: ECOG PERFORMANCE STATUS: 1 -  Symptomatic but completely ambulatory  Today's Vitals   09/26/19 1131  BP: 123/71  Pulse: 63  Resp: 18  Temp: (!) 97.3 F (36.3 C)  TempSrc: Temporal  SpO2: 98%  Weight: 161 lb (73 kg)   Body mass index is 27.64 kg/m.  Filed Weights   09/26/19 1131  Weight: 161 lb (73 kg)    GENERAL: alert, no distress and comfortable SKIN: skin color, texture, turgor are normal, no rashes or significant lesions EYES: conjunctiva are pink and non-injected, sclera clear OROPHARYNX: no exudate, no erythema; lips, buccal mucosa, and tongue normal  NECK: supple, non-tender LYMPH:  no palpable lymphadenopathy in the cervical LUNGS: clear to auscultation with normal breathing effort HEART: regular rate & rhythm and no murmurs and no lower extremity edema ABDOMEN: soft, non-tender, non-distended, normal bowel sounds Musculoskeletal: no cyanosis of digits and no clubbing  PSYCH: alert & oriented x 3, fluent speech  LABORATORY DATA:  I have reviewed the data as listed    Component Value Date/Time   NA 140 09/26/2019 1101   K 4.5 09/26/2019 1101   CL 104 09/26/2019 1101   CO2 32 09/26/2019 1101   GLUCOSE 54 (L) 09/26/2019 1101   GLUCOSE 56 02/08/2010 0000   BUN 16 09/26/2019 1101   CREATININE 0.73 09/26/2019 1101   CALCIUM 9.6 09/26/2019 1101   PROT 6.3 (  L) 09/26/2019 1101   ALBUMIN 4.3 09/26/2019 1101   AST 15 09/26/2019 1101   ALT 21 09/26/2019 1101   ALKPHOS 57 09/26/2019 1101   BILITOT 0.4 09/26/2019 1101   GFRNONAA >60 09/26/2019 1101   GFRAA >60 09/26/2019 1101    No results found for: SPEP, UPEP  Lab Results  Component Value Date   WBC 6.2 09/26/2019   NEUTROABS 3.5 09/26/2019   HGB 14.8 09/26/2019   HCT 43.6 09/26/2019   MCV 89.0 09/26/2019   PLT 212 09/26/2019      Chemistry      Component Value Date/Time   NA 140 09/26/2019 1101   K 4.5 09/26/2019 1101   CL 104 09/26/2019 1101   CO2 32 09/26/2019 1101   BUN 16 09/26/2019 1101   CREATININE 0.73 09/26/2019 1101       Component Value Date/Time   CALCIUM 9.6 09/26/2019 1101   ALKPHOS 57 09/26/2019 1101   AST 15 09/26/2019 1101   ALT 21 09/26/2019 1101   BILITOT 0.4 09/26/2019 1101       RADIOGRAPHIC STUDIES: I have personally reviewed the radiological images as listed below and agreed with the findings in the report. No results found.

## 2019-09-27 ENCOUNTER — Ambulatory Visit: Payer: Medicare PPO | Attending: Internal Medicine

## 2019-09-27 DIAGNOSIS — Z23 Encounter for immunization: Secondary | ICD-10-CM | POA: Insufficient documentation

## 2019-09-27 NOTE — Progress Notes (Signed)
   Covid-19 Vaccination Clinic  Name:  NEIKA FRIEDRICHS    MRN: [id number] DOB: 09/21/1950  09/27/2019  Ms. Garrette was observed post Covid-19 immunization for 15 minutes without incidence. She was provided with Vaccine Information Sheet and instruction to access the V-Safe system.   Ms. Brunn was instructed to call 911 with any severe reactions post vaccine: Marland Kitchen Difficulty breathing  . Swelling of your face and throat  . A fast heartbeat  . A bad rash all over your body  . Dizziness and weakness    Immunizations Administered    Name Date Dose VIS Date Route   Pfizer COVID-19 Vaccine 09/27/2019  8:55 AM 0.3 mL 08/01/2019 Intramuscular   Manufacturer: [place]   Lot: [id number]   [place]: [id number]

## 2019-09-29 ENCOUNTER — Ambulatory Visit: Payer: Medicare Other

## 2019-10-22 ENCOUNTER — Ambulatory Visit: Payer: Medicare PPO | Attending: Internal Medicine

## 2019-10-22 DIAGNOSIS — Z23 Encounter for immunization: Secondary | ICD-10-CM | POA: Insufficient documentation

## 2019-10-22 NOTE — Progress Notes (Signed)
   Covid-19 Vaccination Clinic  Name:  Amy Perkins    MRN: [id number] DOB: 1951/06/01  10/22/2019  [name] was observed post Covid-19 immunization for 15 minutes without incident. She was provided with Vaccine Information Sheet and instruction to access the V-Safe system.   [name] was instructed to call 911 with any severe reactions post vaccine: Marland Kitchen Difficulty breathing  . Swelling of face and throat  . A fast heartbeat  . A bad rash all over body  . Dizziness and weakness   Immunizations Administered    Name Date Dose VIS Date Route   Pfizer COVID-19 Vaccine 10/22/2019  9:47 AM 0.3 mL 08/01/2019 Intramuscular   Manufacturer: [place]   Lot: [id number]   [place]: [id number]

## 2019-10-23 ENCOUNTER — Ambulatory Visit: Payer: Medicare Other

## 2019-10-23 ENCOUNTER — Encounter: Payer: Medicare Other | Admitting: Family Medicine

## 2019-10-27 DIAGNOSIS — F3342 Major depressive disorder, recurrent, in full remission: Secondary | ICD-10-CM | POA: Diagnosis not present

## 2019-10-27 DIAGNOSIS — F411 Generalized anxiety disorder: Secondary | ICD-10-CM | POA: Diagnosis not present

## 2019-11-10 ENCOUNTER — Other Ambulatory Visit: Payer: Self-pay | Admitting: Family Medicine

## 2019-11-11 ENCOUNTER — Other Ambulatory Visit: Payer: Self-pay | Admitting: Hematology

## 2019-11-13 ENCOUNTER — Other Ambulatory Visit: Payer: Self-pay | Admitting: Family Medicine

## 2019-11-19 ENCOUNTER — Encounter: Payer: Medicare PPO | Admitting: Family Medicine

## 2019-11-19 ENCOUNTER — Ambulatory Visit: Payer: Medicare PPO

## 2019-11-26 ENCOUNTER — Encounter: Payer: Medicare PPO | Admitting: Family Medicine

## 2019-11-26 ENCOUNTER — Other Ambulatory Visit: Payer: Self-pay

## 2019-11-26 ENCOUNTER — Encounter: Payer: Self-pay | Admitting: Family Medicine

## 2019-11-26 ENCOUNTER — Ambulatory Visit (INDEPENDENT_AMBULATORY_CARE_PROVIDER_SITE_OTHER): Payer: Medicare PPO

## 2019-11-26 ENCOUNTER — Ambulatory Visit (INDEPENDENT_AMBULATORY_CARE_PROVIDER_SITE_OTHER): Payer: Medicare PPO | Admitting: Family Medicine

## 2019-11-26 VITALS — BP 126/70 | HR 64 | Temp 98.2°F | Ht 64.0 in | Wt 159.6 lb

## 2019-11-26 DIAGNOSIS — E785 Hyperlipidemia, unspecified: Secondary | ICD-10-CM | POA: Diagnosis not present

## 2019-11-26 DIAGNOSIS — Z0000 Encounter for general adult medical examination without abnormal findings: Secondary | ICD-10-CM

## 2019-11-26 LAB — CBC WITH DIFFERENTIAL/PLATELET
Basophils Absolute: 0 10*3/uL (ref 0.0–0.1)
Basophils Relative: 0.9 % (ref 0.0–3.0)
Eosinophils Absolute: 0.2 10*3/uL (ref 0.0–0.7)
Eosinophils Relative: 3 % (ref 0.0–5.0)
HCT: 44.6 % (ref 36.0–46.0)
Hemoglobin: 15.3 g/dL — ABNORMAL HIGH (ref 12.0–15.0)
Lymphocytes Relative: 33.3 % (ref 12.0–46.0)
Lymphs Abs: 1.8 10*3/uL (ref 0.7–4.0)
MCHC: 34.3 g/dL (ref 30.0–36.0)
MCV: 89.4 fl (ref 78.0–100.0)
Monocytes Absolute: 0.3 10*3/uL (ref 0.1–1.0)
Monocytes Relative: 6.2 % (ref 3.0–12.0)
Neutro Abs: 3.1 10*3/uL (ref 1.4–7.7)
Neutrophils Relative %: 56.6 % (ref 43.0–77.0)
Platelets: 212 10*3/uL (ref 150.0–400.0)
RBC: 4.99 Mil/uL (ref 3.87–5.11)
RDW: 13.1 % (ref 11.5–15.5)
WBC: 5.5 10*3/uL (ref 4.0–10.5)

## 2019-11-26 LAB — BASIC METABOLIC PANEL
BUN: 16 mg/dL (ref 6–23)
CO2: 30 mEq/L (ref 19–32)
Calcium: 9.6 mg/dL (ref 8.4–10.5)
Chloride: 104 mEq/L (ref 96–112)
Creatinine, Ser: 0.7 mg/dL (ref 0.40–1.20)
GFR: 82.78 mL/min (ref >60.00)
Glucose, Bld: 105 mg/dL — ABNORMAL HIGH (ref 70–99)
Potassium: 4.1 mEq/L (ref 3.5–5.1)
Sodium: 141 mEq/L (ref 135–145)

## 2019-11-26 LAB — LIPID PANEL
Cholesterol: 193 mg/dL (ref 0–200)
HDL: 55.1 mg/dL (ref >39.00)
LDL Cholesterol: 115 mg/dL — ABNORMAL HIGH (ref 0–99)
NonHDL: 137.43
Total CHOL/HDL Ratio: 3
Triglycerides: 112 mg/dL (ref 0.0–149.0)
VLDL: 22.4 mg/dL (ref 0.0–40.0)

## 2019-11-26 LAB — HEPATIC FUNCTION PANEL
ALT: 33 U/L (ref 0–35)
AST: 20 U/L (ref 0–37)
Albumin: 4.4 g/dL (ref 3.5–5.2)
Alkaline Phosphatase: 64 U/L (ref 39–117)
Bilirubin, Direct: 0.1 mg/dL (ref 0.0–0.3)
Total Bilirubin: 0.5 mg/dL (ref 0.2–1.2)
Total Protein: 6.3 g/dL (ref 6.0–8.3)

## 2019-11-26 LAB — TSH: TSH: 2.04 u[IU]/mL (ref 0.35–4.50)

## 2019-11-26 NOTE — Progress Notes (Signed)
Subjective:   Amy Perkins is a 69 y.o. female who presents for Medicare Annual (Subsequent) preventive examination.  Review of Systems:   Cardiac Risk Factors include: advanced age (>67men, >15 women);dyslipidemia    Objective:     Vitals: BP 126/70   Pulse 64   Temp 98.2 F (36.8 C) (Temporal)   Ht 5\' 4"  (1.626 m)   Wt 159 lb 9.6 oz (72.4 kg)   SpO2 97%   BMI 27.40 kg/m   Body mass index is 27.4 kg/m.  Advanced Directives 11/26/2019 12/27/2018 10/16/2018 08/23/2018 07/18/2018 07/18/2018 10/17/2017  Does Patient Have a Medical Advance Directive? Yes Yes Yes Yes Yes Yes Yes  Type of Advance Directive Living will;Healthcare Power of [place];Living will Living will;Healthcare Power of [place];Living will Healthcare Power of [place];Living will [place];Living will  Does patient want to make changes to medical advance directive? No - Patient declined No - Patient declined - No - Patient declined No - Patient declined - Yes (MAU/Ambulatory/Procedural Areas - Information given)  Copy of [place] in Chart? Yes - validated most recent copy scanned in chart (See row information) No - copy requested No - copy requested No - copy requested No - copy requested No - copy requested Yes    Tobacco Social History   Tobacco Use  Smoking Status Never Smoker  Smokeless Tobacco Never Used     Counseling given: Not Answered   Clinical Intake:  Pre-visit preparation completed: Yes  Pain : No/denies pain  Diabetes: No  How often do you need to have someone help you when you read instructions, pamphlets, or other written materials from your doctor or pharmacy?: 1 - Never  Interpreter Needed?: No  Information entered by :: Denman George LPN  Past Medical History:  Diagnosis Date  . Depression   . Hyperlipidemia   . Migraines   . Squamous cell carcinoma  09/20/2017   chest   Past Surgical History:  Procedure Laterality Date  . DILATION AND CURETTAGE OF UTERUS     x2  . laser vein surgery    . TONSILLECTOMY     Family History  Problem Relation Age of Onset  . Coronary artery disease Father   . Stroke Father   . Parkinson's disease Father   . Hypertension Mother   . Diabetes Mother   . Stroke Mother   . Coronary artery disease Brother   . Factor V Leiden deficiency Daughter    Social History   Socioeconomic History  . Marital status: Divorced    Spouse name: Not on file  . Number of children: 2  . Years of education: college  . Highest education level: Not on file  Occupational History  . Occupation: Product manager: [employer]    Comment: [employer]  Tobacco Use  . Smoking status: Never Smoker  . Smokeless tobacco: Never Used  Substance and Sexual Activity  . Alcohol use: Yes    Alcohol/week: 4.0 standard drinks    Types: 4 Glasses of wine per week    Comment: 4 glass week  . Drug use: No  . Sexual activity: Never  Other Topics Concern  . Not on file  Social History Narrative   Divorced, both children live locally.   Patient works part time at [employer]. College education.   Left handed.   Caffeine- two cups coffee daily.   Social  Determinants of Health   Financial Resource Strain:   . Difficulty of Paying Living Expenses:   Food Insecurity:   . Worried About Charity fundraiser in the Last Year:   . Arboriculturist in the Last Year:   Transportation Needs:   . Film/video editor (Medical):   Marland Kitchen Lack of Transportation (Non-Medical):   Physical Activity:   . Days of Exercise per Week:   . Minutes of Exercise per Session:   Stress:   . Feeling of Stress :   Social Connections:   . Frequency of Communication with Friends and Family:   . Frequency of Social Gatherings with Friends and Family:   . Attends Religious Services:   . Active Member of Clubs or Organizations:   . Attends Theatre manager Meetings:   Marland Kitchen Marital Status:     Outpatient Encounter Medications as of 11/26/2019  Medication Sig  . ALPRAZolam (XANAX) 0.5 MG tablet Take 0.5 mg by mouth See admin instructions. Take 1/2 tablet by mouth 2 times daily (morning and 3PM) , and 1 tablet at bedtime.  . Biotin 1000 MCG CHEW Chew by mouth.  . citalopram (CELEXA) 40 MG tablet Take 1 tablet (40 mg total) by mouth daily.  Marland Kitchen ELIQUIS 5 MG TABS tablet TAKE 1 TABLET BY MOUTH TWICE A DAY  . levothyroxine (SYNTHROID) 50 MCG tablet TAKE 1 TABLET BY MOUTH EVERY DAY  . Multiple Vitamin (MULTIVITAMIN) tablet Take 1 tablet by mouth every evening.   . simvastatin (ZOCOR) 40 MG tablet TAKE 1 TABLET BY MOUTH EVERYDAY AT BEDTIME  . [DISCONTINUED] topiramate (TOPAMAX) 50 MG tablet TAKE 1 TABLET BY MOUTH EVERY MORNING AND TAKE 2 TABLETS EVERY EVENING (Patient not taking: Reported on 08/26/2019)   No facility-administered encounter medications on file as of 11/26/2019.    Activities of Daily Living In your present state of health, do you have any difficulty performing the following activities: 11/26/2019 04/15/2019  Hearing? Y N  Comment followed by audiology -  Vision? N N  Difficulty concentrating or making decisions? N N  Walking or climbing stairs? N N  Dressing or bathing? N N  Doing errands, shopping? N N  Preparing Food and eating ? N -  Using the Toilet? N -  In the past six months, have you accidently leaked urine? N -  Do you have problems with loss of bowel control? N -  Managing your Medications? N -  Managing your Finances? N -  Housekeeping or managing your Housekeeping? N -  Some recent data might be hidden    Patient Care Team: Midge Minium, MD as PCP - General [name], MD as Consulting Physician (Gastroenterology) Marcial Pacas, MD as Consulting Physician (Neurology) Dian Queen, MD as Consulting Physician (Obstetrics and Gynecology) Center, Skin Surgery Luberta Mutter, MD as Consulting  Physician (Ophthalmology) Earley Favor (Dentistry) Cindie Crumbly (Dermatology) Tish Men, MD as Consulting Physician (Hematology) Milly Jakob, MD as Consulting Physician (Orthopedic Surgery)    Assessment:   This is a routine wellness examination for Kimaya.  Exercise Activities and Dietary recommendations Current Exercise Habits: Home exercise routine, Type of exercise: walking, Time (Minutes): 30, Frequency (Times/Week): 5, Weekly Exercise (Minutes/Week): 150, Intensity: Moderate  Goals    . Weight (lb) < 145 lb (65.8 kg)     Lose weight by increasing activity and watching caloric intake.     . Weight (lb) < 145 lb (65.8 kg)     Lose weight by increasing  activity and continuing to watching diet.        Fall Risk Fall Risk  11/26/2019 04/15/2019 10/16/2018 07/29/2018 07/29/2018  Falls in the past year? 0 0 0 1 0  Number falls in past yr: 0 0 - 0 -  Injury with Fall? 0 0 - 0 -  Follow up Falls evaluation completed;Education provided;Falls prevention discussed - - - -   Is the patient's home free of loose throw rugs in walkways, pet beds, electrical cords, etc?   yes      Grab bars in the bathroom? yes      Handrails on the stairs?   yes      Adequate lighting?   yes  Timed Get Up and Go performed: completed and within normal timeframe; no gait abnormalities noted   Depression Screen PHQ 2/9 Scores 11/26/2019 04/15/2019 10/16/2018 07/29/2018  PHQ - 2 Score 0 1 2 0  PHQ- 9 Score - 1 3 0  Exception Documentation (No Data) - - -  Not completed - - - -     Cognitive Function MMSE - Mini Mental State Exam 10/16/2018 10/10/2017  Orientation to time 5 5  Orientation to Place 5 5  Registration 3 3  Attention/ Calculation 5 5  Recall 3 3  Language- name 2 objects 2 2  Language- repeat 1 1  Language- follow 3 step command 3 3  Language- read & follow direction 1 1  Write a sentence 1 1  Copy design 1 1  Total score 30 30     6CIT Screen 11/26/2019  What Year? 0 points  What month?  0 points  What time? 0 points  Count back from 20 0 points  Months in reverse 0 points  Repeat phrase 0 points  Total Score 0    Immunization History  Administered Date(s) Administered  . H1N1 09/30/2008  . Influenza Split 06/28/2011  . Influenza Whole 05/06/2010  . Influenza,inj,Quad PF,6+ Mos 07/08/2013, 07/03/2014, 06/05/2016, 04/13/2017, 04/08/2018, 04/15/2019  . Influenza-Unspecified 06/22/2015  . PFIZER SARS-COV-2 Vaccination 09/27/2019, 10/22/2019  . Pneumococcal Conjugate-13 04/01/2015  . Pneumococcal Polysaccharide-23 04/03/2016  . Td 05/22/2005  . Tdap 06/28/2011  . Zoster 01/14/2014    Qualifies for Shingles Vaccine? Discussed and patient will check with pharmacy for coverage.  Patient education handout provided   Screening Tests Health Maintenance  Topic Date Due  . COLONOSCOPY  02/19/2020  . INFLUENZA VACCINE  03/21/2020  . MAMMOGRAM  03/04/2021  . TETANUS/TDAP  06/27/2021  . DEXA SCAN  Completed  . Hepatitis C Screening  Completed  . PNA vac Low Risk Adult  Completed    Cancer Screenings: Lung: Low Dose CT Chest recommended if Age 28-80 years, 30 pack-year currently smoking OR have quit w/in 15years. Patient does not qualify. Breast:  Up to date on Mammogram? Yes   Up to date of Bone Density/Dexa? Yes Colorectal: colonoscopy 02/2010    Plan:  I have personally reviewed and addressed the Medicare Annual Wellness questionnaire and have noted the following in the patient's chart:  A. Medical and social history B. Use of alcohol, tobacco or illicit drugs  C. Current medications and supplements D. Functional ability and status E.  Nutritional status F.  Physical activity G. Advance directives H. List of other physicians I.  Hospitalizations, surgeries, and ER visits in previous 12 months J.  [place] such as hearing and vision if needed, cognitive and depression L. Referrals, records requested, and appointments- none   In addition,  I have  reviewed and discussed with patient certain preventive protocols, quality metrics, and best practice recommendations. A written personalized care plan for preventive services as well as general preventive health recommendations were provided to patient.   Signed,  Denman George, LPN  Nurse Health Advisor   Nurse Notes: no additional

## 2019-11-26 NOTE — Progress Notes (Signed)
   Subjective:    Patient ID: Amy Perkins, female    DOB: [birth year], 69 y.o.   MRN: [id number]  HPI CPE- UTD on mammo, immunizations.  Due for repeat colonoscopy later this year.  Health Maintenance  Topic Date Due  . COLONOSCOPY  02/19/2020  . INFLUENZA VACCINE  03/21/2020  . MAMMOGRAM  03/04/2021  . TETANUS/TDAP  06/27/2021  . DEXA SCAN  Completed  . Hepatitis C Screening  Completed  . PNA vac Low Risk Adult  Completed      Review of Systems Patient reports no vision/ hearing changes, adenopathy,fever, weight change,  persistant/recurrent hoarseness , swallowing issues, chest pain, palpitations, edema, persistant/recurrent cough, hemoptysis, dyspnea (rest/exertional/paroxysmal nocturnal), gastrointestinal bleeding (melena, rectal bleeding), abdominal pain, significant heartburn, bowel changes, GU symptoms (dysuria, hematuria, incontinence), Gyn symptoms (abnormal  bleeding, pain),  syncope, focal weakness, memory loss, numbness & tingling, skin/hair/nail changes, abnormal bruising or bleeding, anxiety, or depression.   This visit occurred during the SARS-CoV-2 public health emergency.  Safety protocols were in place, including screening questions prior to the visit, additional usage of staff PPE, and extensive cleaning of exam room while observing appropriate contact time as indicated for disinfecting solutions.       Objective:   Physical Exam General Appearance:    Alert, cooperative, no distress, appears stated age  Head:    Normocephalic, without obvious abnormality, atraumatic  Eyes:    PERRL, conjunctiva/corneas clear, EOM's intact, fundi    benign, both eyes  Ears:    Normal TM's and external ear canals, both ears  Nose:   Deferred due to COVID  Throat:   Neck:   Supple, symmetrical, trachea midline, no adenopathy;    Thyroid: no enlargement/tenderness/nodules  Back:     Symmetric, no curvature, ROM normal, no CVA tenderness  Lungs:     Clear to auscultation  bilaterally, respirations unlabored  Chest Wall:    No tenderness or deformity   Heart:    Regular rate and rhythm, S1 and S2 normal, no murmur, rub   or gallop  Breast Exam:    Deferred to GYN  Abdomen:     Soft, non-tender, bowel sounds active all four quadrants,    no masses, no organomegaly  Genitalia:    Deferred to GYN  Rectal:    Extremities:   Extremities normal, atraumatic, no cyanosis or edema  Pulses:   2+ and symmetric all extremities  Skin:   Skin color, texture, turgor normal, no rashes or lesions  Lymph nodes:   Cervical, supraclavicular, and axillary nodes normal  Neurologic:   CNII-XII intact, normal strength, sensation and reflexes    throughout          Assessment & Plan:

## 2019-11-26 NOTE — Patient Instructions (Signed)
[name] , Thank you for taking time to come for your Medicare Wellness Visit. I appreciate your ongoing commitment to your health goals. Please review the following plan we discussed and let me know if I can assist you in the future.   Screening recommendations/referrals: Colorectal Screening: repeat recommended July 2021 Mammogram: up to date; last 03/05/19 Bone Density: up to date; last 03/05/19  Vision and Dental Exams: Recommended annual ophthalmology exams for early detection of glaucoma and other disorders of the eye Recommended annual dental exams for proper oral hygiene  Vaccinations: Influenza vaccine: completed 04/15/19 Pneumococcal vaccine: up to date; last 04/03/16 Tdap vaccine: up to date; last 06/28/11 ; Shingles vaccine: You may receive this vaccine at your local pharmacy. (see handout)   Advanced directives: We have received a copy of your POA (Power of [place]) and/or Living Will. These documents can be located in your chart.  Goals: Recommend to drink at least 6-8 8oz glasses of water per day and consume a balanced diet rich in fresh fruits and vegetables.   Next appointment: Please schedule your Annual Wellness Visit with your Nurse Health Advisor in one year.  Preventive Care 23 Years and Older, Female Preventive care refers to lifestyle choices and visits with your health care provider that can promote health and wellness. What does preventive care include?  A yearly physical exam. This is also called an annual well check.  Dental exams once or twice a year.  Routine eye exams. Ask your health care provider how often you should have your eyes checked.  Personal lifestyle choices, including:  Daily care of your teeth and gums.  Regular physical activity.  Eating a healthy diet.  Avoiding tobacco and drug use.  Limiting alcohol use.  Practicing safe sex.  Taking low-dose aspirin every day if recommended by your health care provider.  Taking  vitamin and mineral supplements as recommended by your health care provider. What happens during an annual well check? The services and screenings done by your health care provider during your annual well check will depend on your age, overall health, lifestyle risk factors, and family history of disease. Counseling  Your health care provider may ask you questions about your:  Alcohol use.  Tobacco use.  Drug use.  Emotional well-being.  Home and relationship well-being.  Sexual activity.  Eating habits.  History of falls.  Memory and ability to understand (cognition).  Work and work Statistician.  Reproductive health. Screening  You may have the following tests or measurements:  Height, weight, and BMI.  Blood pressure.  Lipid and cholesterol levels. These may be checked every 5 years, or more frequently if you are over 29 years old.  Skin check.  Lung cancer screening. You may have this screening every year starting at age 56 if you have a 30-pack-year history of smoking and currently smoke or have quit within the past 15 years.  Fecal occult blood test (FOBT) of the stool. You may have this test every year starting at age 36.  Flexible sigmoidoscopy or colonoscopy. You may have a sigmoidoscopy every 5 years or a colonoscopy every 10 years starting at age 8.  Hepatitis C blood test.  Hepatitis B blood test.  Sexually transmitted disease (STD) testing.  Diabetes screening. This is done by checking your blood sugar (glucose) after you have not eaten for a while (fasting). You may have this done every 1-3 years.  Bone density scan. This is done to screen for osteoporosis. You may have  this done starting at age 82.  Mammogram. This may be done every 1-2 years. Talk to your health care provider about how often you should have regular mammograms. Talk with your health care provider about your test results, treatment options, and if necessary, the need for more  tests. Vaccines  Your health care provider may recommend certain vaccines, such as:  Influenza vaccine. This is recommended every year.  Tetanus, diphtheria, and acellular pertussis (Tdap, Td) vaccine. You may need a Td booster every 10 years.  Zoster vaccine. You may need this after age 82.  Pneumococcal 13-valent conjugate (PCV13) vaccine. One dose is recommended after age 44.  Pneumococcal polysaccharide (PPSV23) vaccine. One dose is recommended after age 26. Talk to your health care provider about which screenings and vaccines you need and how often you need them. This information is not intended to replace advice given to you by your health care provider. Make sure you discuss any questions you have with your health care provider. Document Released: 09/03/2015 Document Revised: 04/26/2016 Document Reviewed: 06/08/2015 Elsevier Interactive Patient Education  2017 [place] Prevention in the Home Falls can cause injuries. They can happen to people of all ages. There are many things you can do to make your home safe and to help prevent falls. What can I do on the outside of my home?  Regularly fix the edges of walkways and driveways and fix any cracks.  Remove anything that might make you trip as you walk through a door, such as a raised step or threshold.  Trim any bushes or trees on the path to your home.  Use bright outdoor lighting.  Clear any walking paths of anything that might make someone trip, such as rocks or tools.  Regularly check to see if handrails are loose or broken. Make sure that both sides of any steps have handrails.  Any raised decks and porches should have guardrails on the edges.  Have any leaves, snow, or ice cleared regularly.  Use sand or salt on walking paths during winter.  Clean up any spills in your garage right away. This includes oil or grease spills. What can I do in the bathroom?  Use night lights.  Install grab bars by the  toilet and in the tub and shower. Do not use towel bars as grab bars.  Use non-skid mats or decals in the tub or shower.  If you need to sit down in the shower, use a plastic, non-slip stool.  Keep the floor dry. Clean up any water that spills on the floor as soon as it happens.  Remove soap buildup in the tub or shower regularly.  Attach bath mats securely with double-sided non-slip rug tape.  Do not have throw rugs and other things on the floor that can make you trip. What can I do in the bedroom?  Use night lights.  Make sure that you have a light by your bed that is easy to reach.  Do not use any sheets or blankets that are too big for your bed. They should not hang down onto the floor.  Have a firm chair that has side arms. You can use this for support while you get dressed.  Do not have throw rugs and other things on the floor that can make you trip. What can I do in the kitchen?  Clean up any spills right away.  Avoid walking on wet floors.  Keep items that you use a lot in easy-to-reach  places.  If you need to reach something above you, use a strong step stool that has a grab bar.  Keep electrical cords out of the way.  Do not use floor polish or wax that makes floors slippery. If you must use wax, use non-skid floor wax.  Do not have throw rugs and other things on the floor that can make you trip. What can I do with my stairs?  Do not leave any items on the stairs.  Make sure that there are handrails on both sides of the stairs and use them. Fix handrails that are broken or loose. Make sure that handrails are as long as the stairways.  Check any carpeting to make sure that it is firmly attached to the stairs. Fix any carpet that is loose or worn.  Avoid having throw rugs at the top or bottom of the stairs. If you do have throw rugs, attach them to the floor with carpet tape.  Make sure that you have a light switch at the top of the stairs and the bottom of  the stairs. If you do not have them, ask someone to add them for you. What else can I do to help prevent falls?  Wear shoes that:  Do not have high heels.  Have rubber bottoms.  Are comfortable and fit you well.  Are closed at the toe. Do not wear sandals.  If you use a stepladder:  Make sure that it is fully opened. Do not climb a closed stepladder.  Make sure that both sides of the stepladder are locked into place.  Ask someone to hold it for you, if possible.  Clearly mark and make sure that you can see:  Any grab bars or handrails.  First and last steps.  Where the edge of each step is.  Use tools that help you move around (mobility aids) if they are needed. These include:  Canes.  Walkers.  Scooters.  Crutches.  Turn on the lights when you go into a dark area. Replace any light bulbs as soon as they burn out.  Set up your furniture so you have a clear path. Avoid moving your furniture around.  If any of your floors are uneven, fix them.  If there are any pets around you, be aware of where they are.  Review your medicines with your doctor. Some medicines can make you feel dizzy. This can increase your chance of falling. Ask your doctor what other things that you can do to help prevent falls. This information is not intended to replace advice given to you by your health care provider. Make sure you discuss any questions you have with your health care provider. Document Released: 06/03/2009 Document Revised: 01/13/2016 Document Reviewed: 09/11/2014 Elsevier Interactive Patient Education  2017 [employer].

## 2019-11-26 NOTE — Patient Instructions (Signed)
Follow up in 6 months to recheck cholesterol ?We'll notify you of your lab results and make any changes if needed ?Keep up the good work on healthy diet and regular exercise- you look great!! ?Call with any questions or concerns ?Stay Safe!  Stay Healthy! ?Happy Spring!!! ?

## 2019-11-26 NOTE — Assessment & Plan Note (Signed)
Chronic problem.  Tolerating statin w/o difficulty.  Check labs.  Adjust meds prn  

## 2019-11-26 NOTE — Assessment & Plan Note (Signed)
Pt's PE WNL.  UTD on mammo, immunizations, colonoscopy.  Check labs.  Anticipatory guidance provided.

## 2020-01-06 ENCOUNTER — Other Ambulatory Visit: Payer: Self-pay | Admitting: Hematology

## 2020-02-20 DIAGNOSIS — H2513 Age-related nuclear cataract, bilateral: Secondary | ICD-10-CM | POA: Diagnosis not present

## 2020-02-20 DIAGNOSIS — H5213 Myopia, bilateral: Secondary | ICD-10-CM | POA: Diagnosis not present

## 2020-03-09 ENCOUNTER — Other Ambulatory Visit: Payer: Self-pay | Admitting: *Deleted

## 2020-03-09 MED ORDER — APIXABAN 5 MG PO TABS: 5.0000 mg | ORAL_TABLET | Freq: Two times a day (BID) | ORAL | 1 refills | Status: DC

## 2020-03-11 DIAGNOSIS — Z124 Encounter for screening for malignant neoplasm of cervix: Secondary | ICD-10-CM | POA: Diagnosis not present

## 2020-03-11 DIAGNOSIS — Z6828 Body mass index (BMI) 28.0-28.9, adult: Secondary | ICD-10-CM | POA: Diagnosis not present

## 2020-03-11 DIAGNOSIS — Z1231 Encounter for screening mammogram for malignant neoplasm of breast: Secondary | ICD-10-CM | POA: Diagnosis not present

## 2020-03-15 ENCOUNTER — Other Ambulatory Visit: Payer: Self-pay | Admitting: Obstetrics and Gynecology

## 2020-03-15 DIAGNOSIS — R928 Other abnormal and inconclusive findings on diagnostic imaging of breast: Secondary | ICD-10-CM

## 2020-03-17 ENCOUNTER — Other Ambulatory Visit: Payer: Medicare PPO

## 2020-03-26 ENCOUNTER — Ambulatory Visit
Admission: RE | Admit: 2020-03-26 | Discharge: 2020-03-26 | Disposition: A | Discharge status: Home / Self Care | Payer: Medicare PPO | Source: Ambulatory Visit | Attending: Obstetrics and Gynecology | Admitting: Obstetrics and Gynecology

## 2020-03-26 ENCOUNTER — Other Ambulatory Visit: Payer: Self-pay

## 2020-03-26 ENCOUNTER — Ambulatory Visit: Payer: Medicare PPO

## 2020-03-26 DIAGNOSIS — R922 Inconclusive mammogram: Secondary | ICD-10-CM | POA: Diagnosis not present

## 2020-03-26 DIAGNOSIS — R928 Other abnormal and inconclusive findings on diagnostic imaging of breast: Secondary | ICD-10-CM

## 2020-03-30 ENCOUNTER — Encounter: Payer: Self-pay | Admitting: Gastroenterology

## 2020-05-06 ENCOUNTER — Other Ambulatory Visit: Payer: Self-pay | Admitting: Family Medicine

## 2020-05-08 ENCOUNTER — Other Ambulatory Visit: Payer: Self-pay | Admitting: Family Medicine

## 2020-05-09 ENCOUNTER — Other Ambulatory Visit: Payer: Self-pay | Admitting: Hematology & Oncology

## 2020-05-19 DIAGNOSIS — L821 Other seborrheic keratosis: Secondary | ICD-10-CM | POA: Diagnosis not present

## 2020-05-19 DIAGNOSIS — Z85828 Personal history of other malignant neoplasm of skin: Secondary | ICD-10-CM | POA: Diagnosis not present

## 2020-05-19 DIAGNOSIS — L819 Disorder of pigmentation, unspecified: Secondary | ICD-10-CM | POA: Diagnosis not present

## 2020-05-19 DIAGNOSIS — L905 Scar conditions and fibrosis of skin: Secondary | ICD-10-CM | POA: Diagnosis not present

## 2020-05-19 DIAGNOSIS — D229 Melanocytic nevi, unspecified: Secondary | ICD-10-CM | POA: Diagnosis not present

## 2020-05-19 DIAGNOSIS — L918 Other hypertrophic disorders of the skin: Secondary | ICD-10-CM | POA: Diagnosis not present

## 2020-05-19 DIAGNOSIS — D485 Neoplasm of uncertain behavior of skin: Secondary | ICD-10-CM | POA: Diagnosis not present

## 2020-05-19 DIAGNOSIS — L814 Other melanin hyperpigmentation: Secondary | ICD-10-CM | POA: Diagnosis not present

## 2020-05-25 ENCOUNTER — Ambulatory Visit (INDEPENDENT_AMBULATORY_CARE_PROVIDER_SITE_OTHER): Payer: Medicare PPO | Admitting: Family Medicine

## 2020-05-25 ENCOUNTER — Other Ambulatory Visit: Payer: Self-pay

## 2020-05-25 ENCOUNTER — Encounter: Payer: Self-pay | Admitting: Family Medicine

## 2020-05-25 VITALS — BP 118/76 | HR 70 | Temp 97.8°F | Resp 16 | Ht 64.0 in | Wt 163.5 lb

## 2020-05-25 DIAGNOSIS — E785 Hyperlipidemia, unspecified: Secondary | ICD-10-CM

## 2020-05-25 DIAGNOSIS — E038 Other specified hypothyroidism: Secondary | ICD-10-CM | POA: Diagnosis not present

## 2020-05-25 DIAGNOSIS — E663 Overweight: Secondary | ICD-10-CM

## 2020-05-25 NOTE — Assessment & Plan Note (Signed)
Chronic problem, tolerating Simvastatin 40mg  daily.  Currently asymptomatic.  Encouraged healthy diet and regular exercise.  Check labs.  Adjust meds prn

## 2020-05-25 NOTE — Progress Notes (Signed)
   Subjective:    Patient ID: Amy Perkins, female    DOB: 27-May-1951, 69 y.o.   MRN: [id number]  HPI Hyperlipidemia- chronic problem, on Simvastatin 40mg  daily.  No CP, SOB, abd pain, N/V.  Hypothyroid- chronic problem, on Levothyroxine 21mcg daily.  Last TSH 2.04.  No changes to skin/hair/nails.  Energy level is stable  Overweight- pt has gained 4 lbs since last visit.  BMI is now 28.06  Pt reports she is having a hard time keeping her weight down.  Has not been going to the gym due to [place].  Is walking regularly with her dog.   Review of Systems For ROS see HPI   This visit occurred during the SARS-CoV-2 public health emergency.  Safety protocols were in place, including screening questions prior to the visit, additional usage of staff PPE, and extensive cleaning of exam room while observing appropriate contact time as indicated for disinfecting solutions.       Objective:   Physical Exam Vitals reviewed.  Constitutional:      General: She is not in acute distress.    Appearance: Normal appearance. She is well-developed.  HENT:     Head: Normocephalic and atraumatic.  Eyes:     Conjunctiva/sclera: Conjunctivae normal.     Pupils: Pupils are equal, round, and reactive to light.  Neck:     Thyroid: No thyromegaly.  Cardiovascular:     Rate and Rhythm: Normal rate and regular rhythm.     Heart sounds: Normal heart sounds. No murmur heard.   Pulmonary:     Effort: Pulmonary effort is normal. No respiratory distress.     Breath sounds: Normal breath sounds.  Abdominal:     General: There is no distension.     Palpations: Abdomen is soft.     Tenderness: There is no abdominal tenderness.  Musculoskeletal:     Cervical back: Normal range of motion and neck supple.  Lymphadenopathy:     Cervical: No cervical adenopathy.  Skin:    General: Skin is warm and dry.  Neurological:     Mental Status: She is alert and oriented to person, place, and time.  Psychiatric:         Behavior: Behavior normal.           Assessment & Plan:

## 2020-05-25 NOTE — Patient Instructions (Signed)
Do your labs when you see Dr Ardis Hughs on Friday Schedule your complete physical in 6 months We'll notify you of your lab results and make any changes if needed Continue to work on healthy diet and regular exercise- you can do it! Call with any questions or concerns Stay Safe!  Stay Healthy!!

## 2020-05-25 NOTE — Assessment & Plan Note (Signed)
Chronic problem.  Currently asymptomatic on Levothyroxine 50mcg daily.  Check labs.  Adjust meds prn  

## 2020-05-25 NOTE — Assessment & Plan Note (Signed)
Pt has gained 4 lbs since last visit.  Again discussed healthy diet and regular exercise.  Will continue to follow.

## 2020-05-26 DIAGNOSIS — K625 Hemorrhage of anus and rectum: Secondary | ICD-10-CM

## 2020-05-26 HISTORY — DX: Hemorrhage of anus and rectum: K62.5

## 2020-05-28 ENCOUNTER — Ambulatory Visit: Payer: Medicare PPO | Admitting: Gastroenterology

## 2020-05-28 ENCOUNTER — Encounter: Payer: Self-pay | Admitting: Gastroenterology

## 2020-05-28 ENCOUNTER — Encounter: Payer: Self-pay | Admitting: General Practice

## 2020-05-28 ENCOUNTER — Other Ambulatory Visit (INDEPENDENT_AMBULATORY_CARE_PROVIDER_SITE_OTHER): Payer: Medicare PPO

## 2020-05-28 ENCOUNTER — Telehealth: Payer: Self-pay

## 2020-05-28 VITALS — BP 102/64 | HR 60 | Ht 64.0 in | Wt 162.0 lb

## 2020-05-28 DIAGNOSIS — E785 Hyperlipidemia, unspecified: Secondary | ICD-10-CM | POA: Diagnosis not present

## 2020-05-28 DIAGNOSIS — E663 Overweight: Secondary | ICD-10-CM | POA: Diagnosis not present

## 2020-05-28 DIAGNOSIS — E038 Other specified hypothyroidism: Secondary | ICD-10-CM | POA: Diagnosis not present

## 2020-05-28 DIAGNOSIS — Z1211 Encounter for screening for malignant neoplasm of colon: Secondary | ICD-10-CM

## 2020-05-28 LAB — HEPATIC FUNCTION PANEL
ALT: 36 U/L — ABNORMAL HIGH (ref 0–35)
AST: 21 U/L (ref 0–37)
Albumin: 4.3 g/dL (ref 3.5–5.2)
Alkaline Phosphatase: 62 U/L (ref 39–117)
Bilirubin, Direct: 0.1 mg/dL (ref 0.0–0.3)
Total Bilirubin: 0.4 mg/dL (ref 0.2–1.2)
Total Protein: 6.7 g/dL (ref 6.0–8.3)

## 2020-05-28 LAB — CBC WITH DIFFERENTIAL/PLATELET
Basophils Absolute: 0 10*3/uL (ref 0.0–0.1)
Basophils Relative: 0.7 % (ref 0.0–3.0)
Eosinophils Absolute: 0.2 10*3/uL (ref 0.0–0.7)
Eosinophils Relative: 3.8 % (ref 0.0–5.0)
HCT: 43.1 % (ref 36.0–46.0)
Hemoglobin: 15 g/dL (ref 12.0–15.0)
Lymphocytes Relative: 30.1 % (ref 12.0–46.0)
Lymphs Abs: 1.8 10*3/uL (ref 0.7–4.0)
MCHC: 34.7 g/dL (ref 30.0–36.0)
MCV: 88 fl (ref 78.0–100.0)
Monocytes Absolute: 0.5 10*3/uL (ref 0.1–1.0)
Monocytes Relative: 8.5 % (ref 3.0–12.0)
Neutro Abs: 3.4 10*3/uL (ref 1.4–7.7)
Neutrophils Relative %: 56.9 % (ref 43.0–77.0)
Platelets: 211 10*3/uL (ref 150.0–400.0)
RBC: 4.9 Mil/uL (ref 3.87–5.11)
RDW: 13 % (ref 11.5–15.5)
WBC: 6 10*3/uL (ref 4.0–10.5)

## 2020-05-28 LAB — LIPID PANEL
Cholesterol: 164 mg/dL (ref 0–200)
HDL: 47 mg/dL (ref >39.00)
LDL Cholesterol: 93 mg/dL (ref 0–99)
NonHDL: 117.33
Total CHOL/HDL Ratio: 3
Triglycerides: 120 mg/dL (ref 0.0–149.0)
VLDL: 24 mg/dL (ref 0.0–40.0)

## 2020-05-28 LAB — BASIC METABOLIC PANEL
BUN: 15 mg/dL (ref 6–23)
CO2: 29 mEq/L (ref 19–32)
Calcium: 9.7 mg/dL (ref 8.4–10.5)
Chloride: 105 mEq/L (ref 96–112)
Creatinine, Ser: 0.79 mg/dL (ref 0.40–1.20)
GFR: 75.69 mL/min (ref >60.00)
Glucose, Bld: 108 mg/dL — ABNORMAL HIGH (ref 70–99)
Potassium: 4.1 mEq/L (ref 3.5–5.1)
Sodium: 141 mEq/L (ref 135–145)

## 2020-05-28 LAB — TSH: TSH: 3.19 u[IU]/mL (ref 0.35–4.50)

## 2020-05-28 MED ORDER — SUPREP BOWEL PREP KIT 17.5-3.13-1.6 GM/177ML PO SOLN: 1.0000 | ORAL | 0 refills | Status: DC

## 2020-05-28 NOTE — Telephone Encounter (Signed)
[facility] Pre-operative Risk Assessment     Request for surgical clearance:     Endoscopy Procedure  What type of surgery is being performed?     Colonoscopy  When is this surgery scheduled?     07-09-20  What type of clearance is required ?   Pharmacy  Are there any medications that need to be held prior to surgery and how long? Eliquis x 1 day  Practice name and name of physician performing surgery?      [facility]  What is your office phone and fax number?      Phone- [phone]  [phone]  Anesthesia type (None, local, MAC, general) ?       MAC

## 2020-05-28 NOTE — Patient Instructions (Signed)
If you are age 70 or older, your body mass index should be between 23-30. Your Body mass index is 27.81 kg/m. If this is out of the aforementioned range listed, please consider follow up with your Primary Care Provider.  If you are age 45 or younger, your body mass index should be between 19-25. Your Body mass index is 27.81 kg/m. If this is out of the aformentioned range listed, please consider follow up with your Primary Care Provider.   You have been scheduled for a colonoscopy. Please follow written instructions given to you at your visit today.  Please pick up your prep supplies at the pharmacy within the next 1-3 days. If you use inhalers (even only as needed), please bring them with you on the day of your procedure.  Due to recent changes in healthcare laws, you may see the results of your imaging and laboratory studies on MyChart before your provider has had a chance to review them.  We understand that in some cases there may be results that are confusing or concerning to you. Not all laboratory results come back in the same time frame and the provider may be waiting for multiple results in order to interpret others.  Please give [place] 48 hours in order for your provider to thoroughly review all the results before contacting the office for clarification of your results.    Thank you for entrusting me with your care and choosing [facility].  Dr Ardis Hughs

## 2020-05-28 NOTE — Progress Notes (Signed)
HPI: This is a very pleasant 69 year old woman   who was referred to me by Midge Minium, MD  to evaluate colon cancer screening.    Colon cancer does not run in her family.  She has had 2 colonoscopies the most recent was 10 years ago.  See that summarized below.  She does not have issues with her GI tract.  Specifically no bleeding, no significant abdominal pains, no trouble with constipation or diarrhea.  Her daughter had an acute DVT several years ago while in college and was found to have factor V and II mutations.  The patient herself awoke 2 years ago on Thanksgiving morning with some chest and some back pain and mild shortness of breath.  This was worked up and eventually found to be from bilateral pulmonary embolus.  She was also found to have factors 2 and 5 mutations and she has been on Eliquis ever since then.    Old Data Reviewed:  She had a colonoscopy with Dr. [name] August 2011 for routine risk colon cancer screening.  She was found to have an inflamed hemorrhoid, tortuous colon, a single subcentimeter hyperplastic polyp was removed from her rectum, Dr. Collene Mares wrote that the cecum base was not clearly visualized.  Blood work April 2021 hemoglobin 15.3  She is on lifelong anticoagulation with Eliquis after she had an unprovoked bilateral PE and found to be heterozygous for factor V Leiden and prothrombin gene mutation.  She sees Dr. Maylon Peppers from hematology.   Review of systems: Pertinent positive and negative review of systems were noted in the above HPI section. All other review negative.   Past Medical History:  Diagnosis Date  . Depression   . Hyperlipidemia   . Migraines   . Rectal bleeding 05/26/2020  . Squamous cell carcinoma 09/20/2017   chest    Past Surgical History:  Procedure Laterality Date  . DILATION AND CURETTAGE OF UTERUS     x2  . laser vein surgery    . TONSILLECTOMY      Current Outpatient Medications  Medication Sig Dispense  Refill  . ALPRAZolam (XANAX) 0.5 MG tablet Take 0.5 mg by mouth See admin instructions. Take 1/2 tablet by mouth 2 times daily (morning and 3PM) , and 1 tablet at bedtime.    . Biotin 1000 MCG CHEW Chew by mouth.    . citalopram (CELEXA) 40 MG tablet Take 1 tablet (40 mg total) by mouth daily. 30 tablet 5  . ELIQUIS 5 MG TABS tablet TAKE 1 TABLET BY MOUTH TWICE A DAY 60 tablet 1  . levothyroxine (SYNTHROID) 50 MCG tablet TAKE 1 TABLET BY MOUTH EVERY DAY 90 tablet 1  . simvastatin (ZOCOR) 40 MG tablet TAKE 1 TABLET BY MOUTH EVERYDAY AT BEDTIME 90 tablet 1   No current facility-administered medications for this visit.    Allergies as of 05/28/2020  . (No Known Allergies)    Family History  Problem Relation Age of Onset  . Coronary artery disease Father   . Stroke Father   . Parkinson's disease Father   . Hypertension Mother   . Diabetes Mother   . Stroke Mother   . Coronary artery disease Brother   . Factor V Leiden deficiency Daughter   . Heart disease Paternal Grandfather   . Colon cancer Neg Hx   . Esophageal cancer Neg Hx   . Pancreatic cancer Neg Hx   . Stomach cancer Neg Hx     Social History  Socioeconomic History  . Marital status: Divorced    Spouse name: Not on file  . Number of children: 2  . Years of education: college  . Highest education level: Not on file  Occupational History  . Occupation: Product manager: [employer]    Comment: [employer]  Tobacco Use  . Smoking status: Never Smoker  . Smokeless tobacco: Never Used  Vaping Use  . Vaping Use: Never used  Substance and Sexual Activity  . Alcohol use: Yes    Alcohol/week: 4.0 standard drinks    Types: 4 Glasses of wine per week    Comment: 4 glass week  . Drug use: No  . Sexual activity: Never  Other Topics Concern  . Not on file  Social History Narrative   Divorced, both children live locally.   Patient works part time at [employer]. College education.   Left handed.   Caffeine- two  cups coffee daily.   Social Determinants of Health   Financial Resource Strain:   . Difficulty of Paying Living Expenses: Not on file  Food Insecurity:   . Worried About Charity fundraiser in the Last Year: Not on file  . Ran Out of Food in the Last Year: Not on file  Transportation Needs:   . Lack of Transportation (Medical): Not on file  . Lack of Transportation (Non-Medical): Not on file  Physical Activity:   . Days of Exercise per Week: Not on file  . Minutes of Exercise per Session: Not on file  Stress:   . Feeling of Stress : Not on file  Social Connections:   . Frequency of Communication with Friends and Family: Not on file  . Frequency of Social Gatherings with Friends and Family: Not on file  . Attends Religious Services: Not on file  . Active Member of Clubs or Organizations: Not on file  . Attends Archivist Meetings: Not on file  . Marital Status: Not on file  Intimate Partner Violence:   . Fear of Current or Ex-Partner: Not on file  . Emotionally Abused: Not on file  . Physically Abused: Not on file  . Sexually Abused: Not on file     Physical Exam: BP 102/64   Pulse 60   Ht 5\' 4"  (1.626 m)   Wt 162 lb (73.5 kg)   BMI 27.81 kg/m  Constitutional: generally well-appearing Psychiatric: alert and oriented x3 Eyes: extraocular movements intact Mouth: oral pharynx moist, no lesions Neck: supple no lymphadenopathy Cardiovascular: heart regular rate and rhythm Lungs: clear to auscultation bilaterally Abdomen: soft, nontender, nondistended, no obvious ascites, no peritoneal signs, normal bowel sounds Extremities: no lower extremity edema bilaterally Skin: no lesions on visible extremities   Assessment and plan: 69 y.o. female with routine risk for colon cancer, increased risk for procedure related complication due to chronic blood thinner use  We discussed the fact that she is on Eliquis for her pulmonary embolus and factor V , 2 mutations.  I  recommended that she hold the Eliquis for 1 day prior to colonoscopy but we are going to clear that with her hematologist to make sure they think that is safe.  They might recommend bridging with Lovenox.  We are going to pick a day for her colonoscopy about 1 month from now and that will give [place] ample time to communicate with her hematologist.  I see no reason for any further blood tests or imaging studies prior to then.   Please see  the "Patient Instructions" section for addition details about the plan.   Owens Loffler, MD [facility] 05/28/2020, 9:28 AM  Cc: Midge Minium, MD  Total time on date of encounter was 38  minutes (this included time spent preparing to see the patient reviewing records; obtaining and/or reviewing separately obtained history; performing a medically appropriate exam and/or evaluation; counseling and educating the patient and family if present; ordering medications, tests or procedures if applicable; and documenting clinical information in the health record).

## 2020-06-10 NOTE — Telephone Encounter (Signed)
24 hour hold should suffice, thanks

## 2020-06-10 NOTE — Telephone Encounter (Signed)
Patient advised to hold Eliquis 24 hours prior to colonoscopy scheduled on 07-09-20.  Patient will hold Eliquis starting the morning of 07-08-20 and will be advised when to restart after the procedure.  Patient agreed to plan and verbalized understanding.  No further questions.

## 2020-06-21 DIAGNOSIS — F411 Generalized anxiety disorder: Secondary | ICD-10-CM | POA: Diagnosis not present

## 2020-06-21 DIAGNOSIS — F3342 Major depressive disorder, recurrent, in full remission: Secondary | ICD-10-CM | POA: Diagnosis not present

## 2020-07-03 DIAGNOSIS — Z20822 Contact with and (suspected) exposure to covid-19: Secondary | ICD-10-CM | POA: Diagnosis not present

## 2020-07-06 ENCOUNTER — Telehealth: Payer: Self-pay | Admitting: Gastroenterology

## 2020-07-06 NOTE — Telephone Encounter (Signed)
The pt has had 2 COVID vaccines plus booster.  She tested negative x2 for COVID this week.  She has been advised that she can proceed with procedure as long as she does not develop a fever or worsens.  The pt has been advised of the information and verbalized understanding.

## 2020-07-06 NOTE — Telephone Encounter (Signed)
Patient called states she currently has a cold and needs to be advised on attending for procedure or not

## 2020-07-07 NOTE — Telephone Encounter (Signed)
Noted the pt has rescheduled

## 2020-07-07 NOTE — Telephone Encounter (Signed)
Pt called stating that sxs have worsened and she feels sicker. She prefers not to undergo procedure on 11/19 feeling like that. She has r/s to 12/14 at 8:30am.

## 2020-07-08 DIAGNOSIS — J209 Acute bronchitis, unspecified: Secondary | ICD-10-CM | POA: Diagnosis not present

## 2020-07-09 ENCOUNTER — Encounter: Payer: Medicare PPO | Admitting: Gastroenterology

## 2020-07-09 ENCOUNTER — Other Ambulatory Visit: Payer: Self-pay | Admitting: Hematology & Oncology

## 2020-07-29 ENCOUNTER — Encounter: Payer: Self-pay | Admitting: Gastroenterology

## 2020-08-03 ENCOUNTER — Other Ambulatory Visit: Payer: Self-pay

## 2020-08-03 ENCOUNTER — Ambulatory Visit (AMBULATORY_SURGERY_CENTER): Payer: Medicare PPO | Admitting: Gastroenterology

## 2020-08-03 ENCOUNTER — Encounter: Payer: Self-pay | Admitting: Gastroenterology

## 2020-08-03 VITALS — BP 108/65 | HR 57 | Temp 97.5°F | Resp 12 | Ht 64.0 in | Wt 162.0 lb

## 2020-08-03 DIAGNOSIS — F419 Anxiety disorder, unspecified: Secondary | ICD-10-CM | POA: Diagnosis not present

## 2020-08-03 DIAGNOSIS — D124 Benign neoplasm of descending colon: Secondary | ICD-10-CM

## 2020-08-03 DIAGNOSIS — Z1211 Encounter for screening for malignant neoplasm of colon: Secondary | ICD-10-CM

## 2020-08-03 DIAGNOSIS — E785 Hyperlipidemia, unspecified: Secondary | ICD-10-CM | POA: Diagnosis not present

## 2020-08-03 DIAGNOSIS — F329 Major depressive disorder, single episode, unspecified: Secondary | ICD-10-CM | POA: Diagnosis not present

## 2020-08-03 DIAGNOSIS — E039 Hypothyroidism, unspecified: Secondary | ICD-10-CM | POA: Diagnosis not present

## 2020-08-03 MED ORDER — SODIUM CHLORIDE 0.9 % IV SOLN: 500.0000 mL | Freq: Once | INTRAVENOUS | Status: DC

## 2020-08-03 NOTE — Progress Notes (Signed)
Called to room to assist during endoscopic procedure.  Patient ID and intended procedure confirmed with present staff. Received instructions for my participation in the procedure from the performing physician.  

## 2020-08-03 NOTE — Patient Instructions (Signed)
Handouts given for polyps and hemorrhoids.  Resume Eliquis today.   YOU HAD AN ENDOSCOPIC PROCEDURE TODAY AT [facility]:   Refer to the procedure report that was given to you for any specific questions about what was found during the examination.  If the procedure report does not answer your questions, please call your gastroenterologist to clarify.  If you requested that your care partner not be given the details of your procedure findings, then the procedure report has been included in a sealed envelope for you to review at your convenience later.  YOU SHOULD EXPECT: Some feelings of bloating in the abdomen. Passage of more gas than usual.  Walking can help get rid of the air that was put into your GI tract during the procedure and reduce the bloating. If you had a lower endoscopy (such as a colonoscopy or flexible sigmoidoscopy) you may notice spotting of blood in your stool or on the toilet paper. If you underwent a bowel prep for your procedure, you may not have a normal bowel movement for a few days.  Please Note:  You might notice some irritation and congestion in your nose or some drainage.  This is from the oxygen used during your procedure.  There is no need for concern and it should clear up in a day or so.  SYMPTOMS TO REPORT IMMEDIATELY:   Following lower endoscopy (colonoscopy or flexible sigmoidoscopy):  Excessive amounts of blood in the stool  Significant tenderness or worsening of abdominal pains  Swelling of the abdomen that is new, acute  Fever of 100F or higher  For urgent or emergent issues, a gastroenterologist can be reached at any hour by calling [phone]. Do not use MyChart messaging for urgent concerns.    DIET:  We do recommend a small meal at first, but then you may proceed to your regular diet.  Drink plenty of fluids but you should avoid alcoholic beverages for 24 hours.  ACTIVITY:  You should plan to take it easy for the rest of today  and you should NOT DRIVE, NO STRENUOUS ACTIVITY, NO WORK or use heavy machinery until tomorrow (because of the sedation medicines used during the test).    FOLLOW UP: Our staff will call the number listed on your records Thursday 12/16 BETWEEN 715-8 AM  following your procedure to check on you and address any questions or concerns that you may have regarding the information given to you following your procedure. If we do not reach you, we will leave a message.  We will attempt to reach you two times.  During this call, we will ask if you have developed any symptoms of COVID 19. If you develop any symptoms (ie: fever, flu-like symptoms, shortness of breath, cough etc.) before then, please call [phone].  If you test positive for Covid 19 in the 2 weeks post procedure, please call and report this information to [place].    If any biopsies were taken you will be contacted by phone or by letter within the next 1-3 weeks.  Please call us at [phone] if you have not heard about the biopsies in 3 weeks.    SIGNATURES/CONFIDENTIALITY: You and/or your care partner have signed paperwork which will be entered into your electronic medical record.  These signatures attest to the fact that that the information above on your After Visit Summary has been reviewed and is understood.  Full responsibility of the confidentiality of this discharge information lies with you and/or  your care-partner.

## 2020-08-03 NOTE — Op Note (Signed)
[place] Patient Name: Amy Perkins Procedure Date: 08/03/2020 8:33 AM MRN: [id number] Endoscopist: Milus Banister , MD Age: 69 Referring MD:  Date of Birth: March 07, 1951 Gender: Female Account #: [id number] Procedure:                Colonoscopy Indications:              Screening for colorectal malignant neoplasm Medicines:                Monitored Anesthesia Care Procedure:                Pre-Anesthesia Assessment:                           - Prior to the procedure, a History and Physical                            was performed, and patient medications and                            allergies were reviewed. The patient's tolerance of                            previous anesthesia was also reviewed. The risks                            and benefits of the procedure and the sedation                            options and risks were discussed with the patient.                            All questions were answered, and informed consent                            was obtained. Prior Anticoagulants: The patient has                            taken Eliquis (apixaban), last dose was 2 days                            prior to procedure. ASA Grade Assessment: II - A                            patient with mild systemic disease. After reviewing                            the risks and benefits, the patient was deemed in                            satisfactory condition to undergo the procedure.                           After obtaining informed consent, the colonoscope  was passed under direct vision. Throughout the                            procedure, the patient's blood pressure, pulse, and                            oxygen saturations were monitored continuously. The                            Colonoscope was introduced through the anus and                            advanced to the the cecum, identified by                            appendiceal  orifice and ileocecal valve. The                            colonoscopy was performed without difficulty. The                            patient tolerated the procedure well. The quality                            of the bowel preparation was good. The ileocecal                            valve, appendiceal orifice, and rectum were                            photographed. Scope In: 8:40:06 AM Scope Out: 8:57:07 AM Scope Withdrawal Time: 0 hours 11 minutes 24 seconds  Total Procedure Duration: 0 hours 17 minutes 1 second  Findings:                 A 5 mm polyp was found in the descending colon. The                            polyp was sessile. The polyp was removed with a                            cold snare. Resection and retrieval were complete.                           External and internal hemorrhoids were found. The                            hemorrhoids were small.                           The exam was otherwise without abnormality on                            direct and retroflexion views. Complications:  No immediate complications. Estimated blood loss:                            None. Estimated Blood Loss:     Estimated blood loss: none. Impression:               - One 5 mm polyp in the descending colon, removed                            with a cold snare. Resected and retrieved.                           - External and internal hemorrhoids.                           - The examination was otherwise normal on direct                            and retroflexion views. Recommendation:           - Patient has a contact number available for                            emergencies. The signs and symptoms of potential                            delayed complications were discussed with the                            patient. Return to normal activities tomorrow.                            Written discharge instructions were provided to the                             patient.                           - Resume previous diet.                           - Continue present medications. OK to resume your                            eliquis today.                           - Await pathology results. Milus Banister, MD 08/03/2020 9:00:46 AM This report has been signed electronically.

## 2020-08-03 NOTE — Progress Notes (Signed)
Pt's states no medical or surgical changes since previsit or office visit.  SM - vitals

## 2020-08-03 NOTE — Progress Notes (Signed)
Report to PACU, RN, vss, BBS= Clear.  

## 2020-08-05 ENCOUNTER — Telehealth: Payer: Self-pay

## 2020-08-05 NOTE — Telephone Encounter (Signed)
°  Follow up Call-  Call back number 08/03/2020  Post procedure Call Back phone  # [phone]  Permission to leave phone message Yes  Some recent data might be hidden     Patient questions:  Do you have a fever, pain , or abdominal swelling? No. Pain Score  0 *  Have you tolerated food without any problems? Yes.    Have you been able to return to your normal activities? Yes.    Do you have any questions about your discharge instructions: Diet   No. Medications  No. Follow up visit  No.  Do you have questions or concerns about your Care? No.  Actions: * If pain score is 4 or above: No action needed, pain <4.  1. Have you developed a fever since your procedure? no  2.   Have you had an respiratory symptoms (SOB or cough) since your procedure? no  3.   Have you tested positive for COVID 19 since your procedure no  4.   Have you had any family members/close contacts diagnosed with the COVID 19 since your procedure?  no   If yes to any of these questions please route to Joylene John, RN and Joella Prince, RN

## 2020-08-11 ENCOUNTER — Encounter: Payer: Self-pay | Admitting: Gastroenterology

## 2020-09-07 ENCOUNTER — Other Ambulatory Visit: Payer: Self-pay | Admitting: Hematology & Oncology

## 2020-09-24 ENCOUNTER — Encounter: Payer: Self-pay | Admitting: Hematology & Oncology

## 2020-09-24 ENCOUNTER — Telehealth: Payer: Self-pay | Admitting: *Deleted

## 2020-09-24 ENCOUNTER — Inpatient Hospital Stay: Payer: Medicare PPO | Attending: Hematology & Oncology | Admitting: Hematology & Oncology

## 2020-09-24 ENCOUNTER — Inpatient Hospital Stay: Payer: Medicare PPO

## 2020-09-24 ENCOUNTER — Other Ambulatory Visit: Payer: Self-pay

## 2020-09-24 VITALS — BP 99/59 | HR 65 | Temp 98.1°F | Resp 18 | Wt 166.0 lb

## 2020-09-24 DIAGNOSIS — D6851 Activated protein C resistance: Secondary | ICD-10-CM

## 2020-09-24 DIAGNOSIS — Z86711 Personal history of pulmonary embolism: Secondary | ICD-10-CM | POA: Diagnosis not present

## 2020-09-24 DIAGNOSIS — I2699 Other pulmonary embolism without acute cor pulmonale: Secondary | ICD-10-CM

## 2020-09-24 DIAGNOSIS — Z79899 Other long term (current) drug therapy: Secondary | ICD-10-CM | POA: Diagnosis not present

## 2020-09-24 DIAGNOSIS — Z7901 Long term (current) use of anticoagulants: Secondary | ICD-10-CM | POA: Insufficient documentation

## 2020-09-24 LAB — CMP (CANCER CENTER ONLY)
ALT: 29 U/L (ref 0–44)
AST: 17 U/L (ref 15–41)
Albumin: 4.3 g/dL (ref 3.5–5.0)
Alkaline Phosphatase: 51 U/L (ref 38–126)
Anion gap: 6 (ref 5–15)
BUN: 15 mg/dL (ref 8–23)
CO2: 30 mmol/L (ref 22–32)
Calcium: 9.7 mg/dL (ref 8.9–10.3)
Chloride: 104 mmol/L (ref 98–111)
Creatinine: 0.75 mg/dL (ref 0.44–1.00)
GFR, Estimated: >60 mL/min (ref >60)
Glucose, Bld: 74 mg/dL (ref 70–99)
Potassium: 4 mmol/L (ref 3.5–5.1)
Sodium: 140 mmol/L (ref 135–145)
Total Bilirubin: 0.4 mg/dL (ref 0.3–1.2)
Total Protein: 6 g/dL — ABNORMAL LOW (ref 6.5–8.1)

## 2020-09-24 LAB — CBC WITH DIFFERENTIAL (CANCER CENTER ONLY)
Abs Immature Granulocytes: 0.01 10*3/uL (ref 0.00–0.07)
Basophils Absolute: 0 10*3/uL (ref 0.0–0.1)
Basophils Relative: 1 %
Eosinophils Absolute: 0.2 10*3/uL (ref 0.0–0.5)
Eosinophils Relative: 4 %
HCT: 42.9 % (ref 36.0–46.0)
Hemoglobin: 14.7 g/dL (ref 12.0–15.0)
Immature Granulocytes: 0 %
Lymphocytes Relative: 32 %
Lymphs Abs: 2 10*3/uL (ref 0.7–4.0)
MCH: 30.2 pg (ref 26.0–34.0)
MCHC: 34.3 g/dL (ref 30.0–36.0)
MCV: 88.1 fL (ref 80.0–100.0)
Monocytes Absolute: 0.5 10*3/uL (ref 0.1–1.0)
Monocytes Relative: 9 %
Neutro Abs: 3.4 10*3/uL (ref 1.7–7.7)
Neutrophils Relative %: 54 %
Platelet Count: 183 10*3/uL (ref 150–400)
RBC: 4.87 MIL/uL (ref 3.87–5.11)
RDW: 12.3 % (ref 11.5–15.5)
WBC Count: 6.2 10*3/uL (ref 4.0–10.5)
nRBC: 0 % (ref 0.0–0.2)

## 2020-09-24 LAB — D-DIMER, QUANTITATIVE: D-Dimer, Quant: 0.45 ug/mL-FEU (ref 0.00–0.50)

## 2020-09-24 NOTE — Telephone Encounter (Signed)
Per los 09/24/20 caleed and  lvm patient of upcoming appts - in [place]

## 2020-09-24 NOTE — Progress Notes (Signed)
Hematology and Oncology Follow Up Visit  Amy Perkins [id number] 1951/07/27 70 y.o. 09/24/2020   Principle Diagnosis:   Pulmonary Embolism -- Facor V Leiden/Prothrombin II mutation  Current Therapy:    Eliquis 5 mg po BID -- lifelong     Interim History:  Amy Perkins is back for follow-up.  She comes yearly.  She was followed by Dr. Maylon Peppers.  She is a very nice 70 year old white female.  She is originally from [place].  She had a pulmonary embolism.  This was back in November 2019.  She was found to have 2 separate hypercoagulable states.  She had the Prothrombin II mutation and the factor V Leiden mutation.  She is on Eliquis.  She is on lifelong Eliquis.  She is doing well with this.  She has had no problems with bleeding.  She has had no nausea or vomiting.  She has had no chest wall pain.  She has avoided the coronavirus.  There is been no issues with leg swelling.  She has had no fever.  She has had no change in bowel or bladder habits.  She is up-to-date with her mammograms and her colonoscopy.  Overall, I would say her performance status is ECOG 1.  Medications:  Current Outpatient Medications:  .  ALPRAZolam (XANAX) 0.5 MG tablet, Take 0.5 mg by mouth See admin instructions. Take 1/2 tablet by mouth 2 times daily (morning and 3PM) , and 1 tablet at bedtime., Disp: , Rfl:  .  Biotin 1000 MCG CHEW, Chew by mouth., Disp: , Rfl:  .  citalopram (CELEXA) 40 MG tablet, Take 1 tablet (40 mg total) by mouth daily., Disp: 30 tablet, Rfl: 5 .  ELIQUIS 5 MG TABS tablet, TAKE 1 TABLET BY MOUTH TWICE A DAY, Disp: 60 tablet, Rfl: 1 .  levothyroxine (SYNTHROID) 50 MCG tablet, TAKE 1 TABLET BY MOUTH EVERY DAY, Disp: 90 tablet, Rfl: 1 .  simvastatin (ZOCOR) 40 MG tablet, TAKE 1 TABLET BY MOUTH EVERYDAY AT BEDTIME, Disp: 90 tablet, Rfl: 1  Allergies: No Known Allergies  Past Medical History, Surgical history, Social history, and Family History were reviewed and updated.  Review of  Systems: Review of Systems  Constitutional: Negative.   HENT:  Negative.   Eyes: Negative.   Respiratory: Negative.   Cardiovascular: Negative.   Gastrointestinal: Negative.   Endocrine: Negative.   Genitourinary: Negative.    Musculoskeletal: Negative.   Skin: Negative.   Neurological: Negative.   Hematological: Negative.   Psychiatric/Behavioral: Negative.     Physical Exam:  weight is 166 lb (75.3 kg). Her oral temperature is 98.1 F (36.7 C). Her blood pressure is 99/59 (abnormal) and her pulse is 65. Her respiration is 18 and oxygen saturation is 96%.   Wt Readings from Last 3 Encounters:  09/24/20 166 lb (75.3 kg)  08/03/20 162 lb (73.5 kg)  05/28/20 162 lb (73.5 kg)    Physical Exam Vitals reviewed.  HENT:     Head: Normocephalic and atraumatic.     Mouth/Throat:     Mouth: Oropharynx is clear and moist.  Eyes:     Extraocular Movements: EOM normal.     Pupils: Pupils are equal, round, and reactive to light.  Cardiovascular:     Rate and Rhythm: Normal rate and regular rhythm.     Heart sounds: Normal heart sounds.  Pulmonary:     Effort: Pulmonary effort is normal.     Breath sounds: Normal breath sounds.  Abdominal:  General: Bowel sounds are normal.     Palpations: Abdomen is soft.  Musculoskeletal:        General: No tenderness, deformity or edema. Normal range of motion.     Cervical back: Normal range of motion.  Lymphadenopathy:     Cervical: No cervical adenopathy.  Skin:    General: Skin is warm and dry.     Findings: No erythema or rash.  Neurological:     Mental Status: She is alert and oriented to person, place, and time.  Psychiatric:        Mood and Affect: Mood and affect normal.        Behavior: Behavior normal.        Thought Content: Thought content normal.        Judgment: Judgment normal.    Lab Results  Component Value Date   WBC 6.2 09/24/2020   HGB 14.7 09/24/2020   HCT 42.9 09/24/2020   MCV 88.1 09/24/2020   PLT 183  09/24/2020     Chemistry      Component Value Date/Time   NA 140 09/24/2020 0931   K 4.0 09/24/2020 0931   CL 104 09/24/2020 0931   CO2 30 09/24/2020 0931   BUN 15 09/24/2020 0931   CREATININE 0.75 09/24/2020 0931      Component Value Date/Time   CALCIUM 9.7 09/24/2020 0931   ALKPHOS 51 09/24/2020 0931   AST 17 09/24/2020 0931   ALT 29 09/24/2020 0931   BILITOT 0.4 09/24/2020 0931      Impression and Plan: Amy Perkins is a very charming 70 year old white female.  She has 2 hypercoagulable state.  She had a pulmonary embolism.  She is on lifelong anticoagulation.  I do not see any problems right now.  I do not see that we have to do any scans on her.  I forgot to mention that her D-dimer was normal at 0.45.  We will still see her back yearly.  I think this would be very reasonable.  She was knows that she can come back sooner if she has any difficulties.  Again, it was a lot of fun to talk with her.   Amy Napoleon, MD 2/4/202212:26 PM

## 2020-10-01 ENCOUNTER — Telehealth: Payer: Self-pay | Admitting: Gastroenterology

## 2020-10-01 NOTE — Telephone Encounter (Signed)
I spoke with her on the phone this morning concerning the "data breech" about which she received a letter from [facility] health.  She did get the letter yesterday.  We discussed the fact that this data breech and loss of her tissue in no way affects her medical care or the recommendations that I gave to her following her colonoscopy.  She was grateful for the call.

## 2020-10-04 IMAGING — MG MM DIGITAL DIAGNOSTIC UNILAT*L* W/ TOMO W/ CAD
4 series · 4 of 12 positions shown · non-contrast
Comparison: Previous exam(s).

CLINICAL DATA: 69-year-old female recalled from screening mammogram
dated 03/11/2020 for a possible left breast distortion.

EXAM:
DIGITAL DIAGNOSTIC UNILATERAL LEFT MAMMOGRAM WITH TOMO AND CAD

[L CC synth-2D]
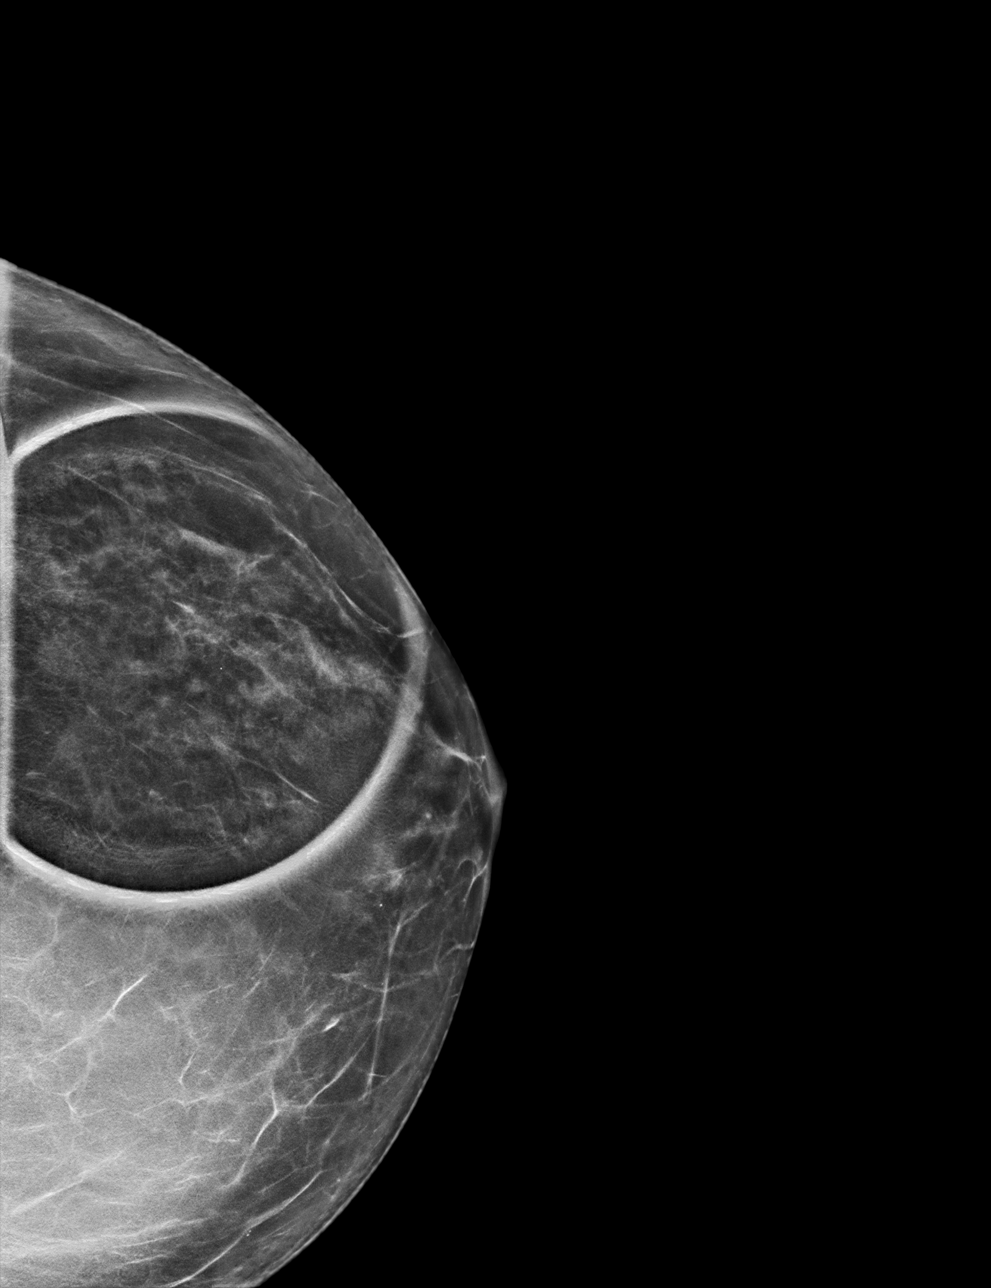

[L MLO synth-2D]
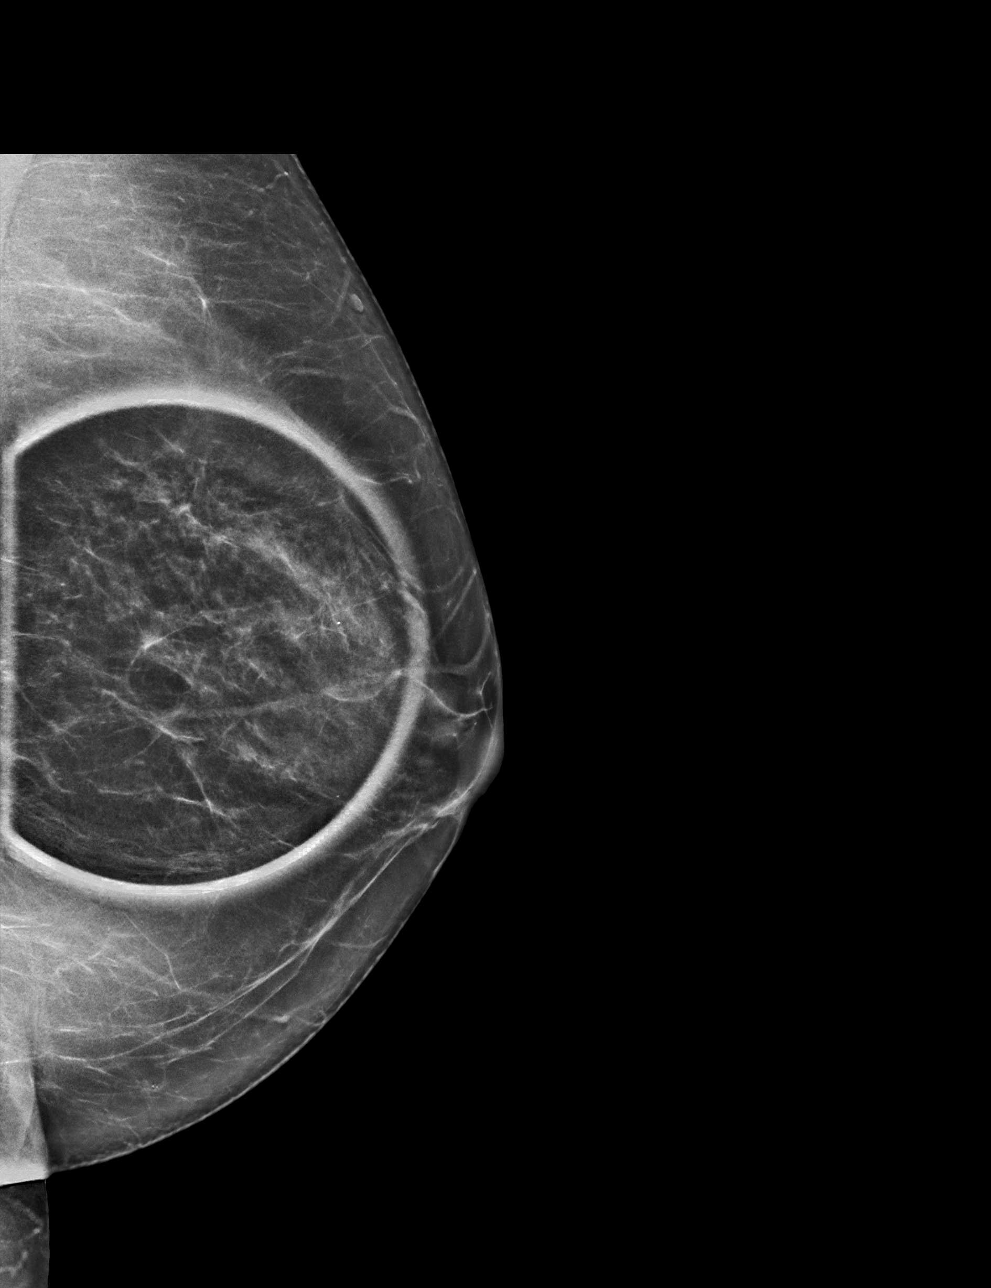

[L CC tomo · tomo slice 33/65.0]
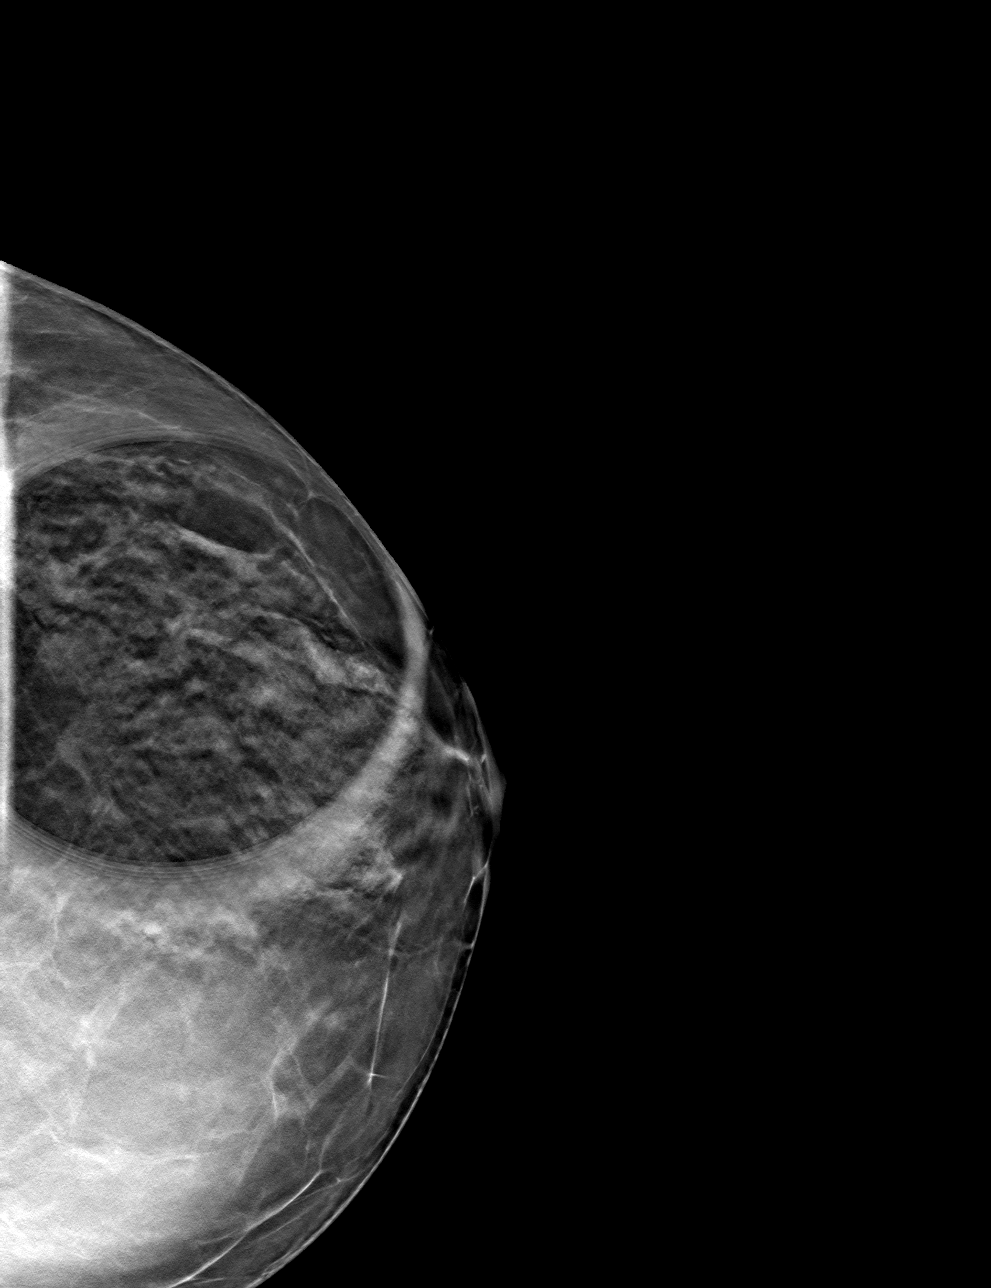

[L MLO tomo · tomo slice 34/67.0]
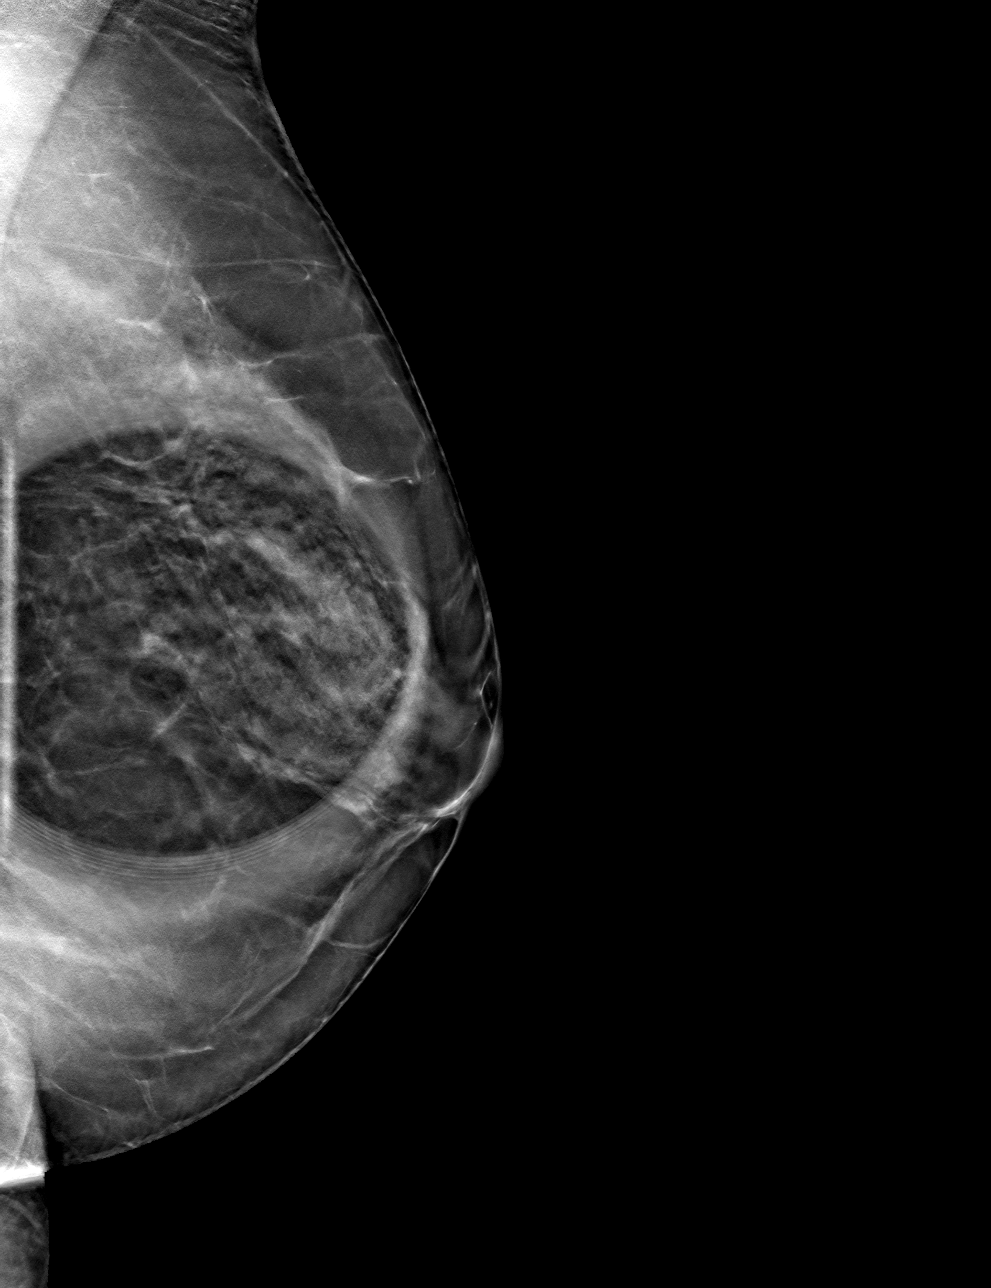

[4 of 12 positions shown; findings below may reference images not displayed]

ACR Breast Density Category c: The breast tissue is heterogeneously
dense, which may obscure small masses.
FINDINGS: Previously described, possible distortion in the upper-outer left
breast resolves into well dispersed fibroglandular tissue on today's
additional views. No suspicious findings are identified.

Mammographic images were processed with CAD.
IMPRESSION: No mammographic evidence of malignancy.

RECOMMENDATION:
Screening mammogram in one year.(Code:Y7-V-RIY)

I have discussed the findings and recommendations with the patient.
If applicable, a reminder letter will be sent to the patient
regarding the next appointment.

BI-RADS CATEGORY  1: Negative.

## 2020-11-06 ENCOUNTER — Other Ambulatory Visit: Payer: Self-pay | Admitting: Family Medicine

## 2020-11-08 ENCOUNTER — Other Ambulatory Visit: Payer: Self-pay | Admitting: Hematology & Oncology

## 2020-11-09 ENCOUNTER — Other Ambulatory Visit: Payer: Self-pay | Admitting: Family Medicine

## 2020-11-23 ENCOUNTER — Encounter: Payer: Medicare PPO | Admitting: Family Medicine

## 2020-11-29 ENCOUNTER — Other Ambulatory Visit: Payer: Self-pay

## 2020-11-29 ENCOUNTER — Ambulatory Visit (INDEPENDENT_AMBULATORY_CARE_PROVIDER_SITE_OTHER): Payer: Medicare PPO

## 2020-11-29 ENCOUNTER — Ambulatory Visit (INDEPENDENT_AMBULATORY_CARE_PROVIDER_SITE_OTHER): Payer: Medicare PPO | Admitting: Family Medicine

## 2020-11-29 ENCOUNTER — Encounter: Payer: Self-pay | Admitting: Family Medicine

## 2020-11-29 VITALS — BP 115/75 | HR 65 | Temp 97.1°F | Resp 17 | Ht 63.0 in | Wt 164.6 lb

## 2020-11-29 VITALS — BP 115/75 | HR 65 | Temp 97.1°F | Resp 17 | Ht 63.0 in | Wt 164.0 lb

## 2020-11-29 DIAGNOSIS — Z0000 Encounter for general adult medical examination without abnormal findings: Secondary | ICD-10-CM

## 2020-11-29 DIAGNOSIS — E785 Hyperlipidemia, unspecified: Secondary | ICD-10-CM | POA: Diagnosis not present

## 2020-11-29 DIAGNOSIS — R0789 Other chest pain: Secondary | ICD-10-CM

## 2020-11-29 LAB — LIPID PANEL
Cholesterol: 178 mg/dL (ref 0–200)
HDL: 51.7 mg/dL (ref >39.00)
LDL Cholesterol: 102 mg/dL — ABNORMAL HIGH (ref 0–99)
NonHDL: 125.97
Total CHOL/HDL Ratio: 3
Triglycerides: 122 mg/dL (ref 0.0–149.0)
VLDL: 24.4 mg/dL (ref 0.0–40.0)

## 2020-11-29 LAB — CBC WITH DIFFERENTIAL/PLATELET
Basophils Absolute: 0.1 10*3/uL (ref 0.0–0.1)
Basophils Relative: 1.2 % (ref 0.0–3.0)
Eosinophils Absolute: 0.2 10*3/uL (ref 0.0–0.7)
Eosinophils Relative: 2.9 % (ref 0.0–5.0)
HCT: 45 % (ref 36.0–46.0)
Hemoglobin: 15.2 g/dL — ABNORMAL HIGH (ref 12.0–15.0)
Lymphocytes Relative: 29.4 % (ref 12.0–46.0)
Lymphs Abs: 1.8 10*3/uL (ref 0.7–4.0)
MCHC: 33.9 g/dL (ref 30.0–36.0)
MCV: 89.7 fl (ref 78.0–100.0)
Monocytes Absolute: 0.4 10*3/uL (ref 0.1–1.0)
Monocytes Relative: 6.3 % (ref 3.0–12.0)
Neutro Abs: 3.7 10*3/uL (ref 1.4–7.7)
Neutrophils Relative %: 60.2 % (ref 43.0–77.0)
Platelets: 214 10*3/uL (ref 150.0–400.0)
RBC: 5.02 Mil/uL (ref 3.87–5.11)
RDW: 13.1 % (ref 11.5–15.5)
WBC: 6.2 10*3/uL (ref 4.0–10.5)

## 2020-11-29 LAB — BASIC METABOLIC PANEL
BUN: 16 mg/dL (ref 6–23)
CO2: 31 mEq/L (ref 19–32)
Calcium: 9.9 mg/dL (ref 8.4–10.5)
Chloride: 104 mEq/L (ref 96–112)
Creatinine, Ser: 0.73 mg/dL (ref 0.40–1.20)
GFR: 83.1 mL/min (ref >60.00)
Glucose, Bld: 103 mg/dL — ABNORMAL HIGH (ref 70–99)
Potassium: 4 mEq/L (ref 3.5–5.1)
Sodium: 141 mEq/L (ref 135–145)

## 2020-11-29 LAB — HEPATIC FUNCTION PANEL
ALT: 29 U/L (ref 0–35)
AST: 21 U/L (ref 0–37)
Albumin: 4.3 g/dL (ref 3.5–5.2)
Alkaline Phosphatase: 58 U/L (ref 39–117)
Bilirubin, Direct: 0.1 mg/dL (ref 0.0–0.3)
Total Bilirubin: 0.4 mg/dL (ref 0.2–1.2)
Total Protein: 6.6 g/dL (ref 6.0–8.3)

## 2020-11-29 LAB — TSH: TSH: 4.01 u[IU]/mL (ref 0.35–4.50)

## 2020-11-29 NOTE — Patient Instructions (Addendum)
Follow up in 6 months to recheck cholesterol We'll notify you of your lab results and make any changes if needed Keep up the good work on healthy diet and regular exercise- you're doing great! Thankfully your EKG is unchanged since 2015.  No obvious cause of chest discomfort.  But if your symptoms change or worsen, please let me know so we can do a cardiology referral. Call with any questions or concerns Stay Safe!  Stay Healthy! Happy Spring!

## 2020-11-29 NOTE — Progress Notes (Signed)
Subjective:   Amy Perkins is a 70 y.o. female who presents for Medicare Annual (Subsequent) preventive examination.  Review of Systems     Cardiac Risk Factors include: advanced age (>64men, >10 women);dyslipidemia     Objective:    Today's Vitals   11/29/20 0934  BP: 115/75  Pulse: 65  Resp: 17  Temp: (!) 97.1 F (36.2 C)  TempSrc: Temporal  SpO2: 98%  Weight: 164 lb (74.4 kg)  Height: 5\' 3"  (1.6 m)   Body mass index is 29.05 kg/m.  Advanced Directives 11/29/2020 09/24/2020 11/26/2019 12/27/2018 10/16/2018 08/23/2018 07/18/2018  Does Patient Have a Medical Advance Directive? Yes Yes Yes Yes Yes Yes Yes  Type of Paramedic of [place];Living will Living will;Healthcare Power of Attorney Living will;Healthcare Power of [place];Living will Living will;Healthcare Power of [place];Living will [place]  Does patient want to make changes to medical advance directive? - No - Patient declined No - Patient declined No - Patient declined - No - Patient declined No - Patient declined  Copy of [place] in Chart? Yes - validated most recent copy scanned in chart (See row information) - Yes - validated most recent copy scanned in chart (See row information) No - copy requested No - copy requested No - copy requested No - copy requested    Current Medications (verified) Outpatient Encounter Medications as of 11/29/2020  Medication Sig  . ALPRAZolam (XANAX) 0.5 MG tablet Take 0.5 mg by mouth See admin instructions. Take 1/2 tablet by mouth 2 times daily (morning and 3PM) , and 1 tablet at bedtime.  . citalopram (CELEXA) 40 MG tablet Take 1 tablet (40 mg total) by mouth daily.  Marland Kitchen ELIQUIS 5 MG TABS tablet TAKE 1 TABLET BY MOUTH TWICE A DAY  . levothyroxine (SYNTHROID) 50 MCG tablet TAKE 1 TABLET BY MOUTH EVERY DAY  . simvastatin (ZOCOR) 40 MG tablet TAKE 1 TABLET BY MOUTH  EVERYDAY AT BEDTIME  . Biotin 1000 MCG CHEW Chew by mouth. (Patient not taking: No sig reported)   No facility-administered encounter medications on file as of 11/29/2020.    Allergies (verified) Patient has no known allergies.   History: Past Medical History:  Diagnosis Date  . Depression   . Hyperlipidemia   . Migraines   . Rectal bleeding 05/26/2020  . Squamous cell carcinoma 09/20/2017   chest   Past Surgical History:  Procedure Laterality Date  . DILATION AND CURETTAGE OF UTERUS     x2  . laser vein surgery    . TONSILLECTOMY     Family History  Problem Relation Age of Onset  . Coronary artery disease Father   . Stroke Father   . Parkinson's disease Father   . Hypertension Mother   . Diabetes Mother   . Stroke Mother   . Coronary artery disease Brother   . Factor V Leiden deficiency Daughter   . Heart disease Paternal Grandfather   . Colon cancer Neg Hx   . Esophageal cancer Neg Hx   . Pancreatic cancer Neg Hx   . Stomach cancer Neg Hx   . Rectal cancer Neg Hx    Social History   Socioeconomic History  . Marital status: Divorced    Spouse name: Not on file  . Number of children: 2  . Years of education: college  . Highest education level: Not on file  Occupational History  . Occupation: Pharmacist, hospital  Employer: [employer]    Comment: [employer]  Tobacco Use  . Smoking status: Never Smoker  . Smokeless tobacco: Never Used  Vaping Use  . Vaping Use: Never used  Substance and Sexual Activity  . Alcohol use: Yes    Alcohol/week: 4.0 standard drinks    Types: 4 Glasses of wine per week    Comment: 4 glass week  . Drug use: No  . Sexual activity: Never  Other Topics Concern  . Not on file  Social History Narrative   Divorced, both children live locally.   Patient works part time at [employer]. College education.   Left handed.   Caffeine- two cups coffee daily.   Social Determinants of Health   Financial Resource Strain: Low Risk   .  Difficulty of Paying Living Expenses: Not hard at all  Food Insecurity: No Food Insecurity  . Worried About Charity fundraiser in the Last Year: Never true  . Ran Out of Food in the Last Year: Never true  Transportation Needs: No Transportation Needs  . Lack of Transportation (Medical): No  . Lack of Transportation (Non-Medical): No  Physical Activity: Sufficiently Active  . Days of Exercise per Week: 7 days  . Minutes of Exercise per Session: 60 min  Stress: No Stress Concern Present  . Feeling of Stress : Not at all  Social Connections: Moderately Isolated  . Frequency of Communication with Friends and Family: More than three times a week  . Frequency of Social Gatherings with Friends and Family: More than three times a week  . Attends Religious Services: More than 4 times per year  . Active Member of Clubs or Organizations: No  . Attends Archivist Meetings: Never  . Marital Status: Divorced    Tobacco Counseling Counseling given: Not Answered   Clinical Intake:  Pre-visit preparation completed: Yes  Pain : 0-10 (addressed by PCP today) Pain Location: Chest Pain Onset: In the past 7 days     Nutritional Status: BMI 25 -29 Overweight Nutritional Risks: None Diabetes: No  How often do you need to have someone help you when you read instructions, pamphlets, or other written materials from your doctor or pharmacy?: 1 - Never  Diabetic?No  Interpreter Needed?: No  Information entered by :: Caroleen Hamman LPn   Activities of Daily Living In your present state of health, do you have any difficulty performing the following activities: 11/29/2020 11/29/2020  Hearing? N Y  Vision? N N  Difficulty concentrating or making decisions? N N  Walking or climbing stairs? N N  Dressing or bathing? N N  Doing errands, shopping? N N  Preparing Food and eating ? N -  Using the Toilet? N -  In the past six months, have you accidently leaked urine? N -  Do you have  problems with loss of bowel control? N -  Managing your Medications? N -  Managing your Finances? N -  Housekeeping or managing your Housekeeping? N -  Some recent data might be hidden    Patient Care Team: Midge Minium, MD as PCP - General [name], MD as Consulting Physician (Gastroenterology) Marcial Pacas, MD as Consulting Physician (Neurology) Dian Queen, MD as Consulting Physician (Obstetrics and Gynecology) [facility] Luberta Mutter, MD as Consulting Physician (Ophthalmology) Earley Favor (Dentistry) Cindie Crumbly (Dermatology) Tish Men, MD (Inactive) as Consulting Physician (Hematology) Milly Jakob, MD as Consulting Physician (Orthopedic Surgery)  Indicate any recent Medical Services you may have received from  other than [facility] providers in the past year (date may be approximate).     Assessment:   This is a routine wellness examination for Tonnie.  Hearing/Vision screen  Hearing Screening   125Hz  250Hz  500Hz  1000Hz  2000Hz  3000Hz  4000Hz  6000Hz  8000Hz   Right ear:           Left ear:           Comments: C/o mild hearing loss  Vision Screening Comments: Last eye exam-02/2020-[facility] Wears contacts  Dietary issues and exercise activities discussed: Current Exercise Habits: Home exercise routine, Type of exercise: strength training/weights;walking;Other - see comments (exercise class), Time (Minutes): 60, Frequency (Times/Week): 7, Weekly Exercise (Minutes/Week): 420, Intensity: Mild  Goals    . Weight (lb) < 145 lb (65.8 kg)     Lose weight by increasing activity and watching caloric intake.       Depression Screen PHQ 2/9 Scores 11/29/2020 11/29/2020 05/25/2020 11/26/2019 04/15/2019 10/16/2018 07/29/2018  PHQ - 2 Score 0 0 0 0 1 2 0  PHQ- 9 Score - 0 0 - 1 3 0  Exception Documentation - - - (No Data) - - -  Not completed - - - - - - -    Fall Risk Fall Risk  11/29/2020 11/29/2020 05/25/2020 11/26/2019 04/15/2019  Falls in the past  year? 0 0 0 0 0  Number falls in past yr: 0 0 0 0 0  Injury with Fall? 0 0 0 0 0  Risk for fall due to : - No Fall Risks - - -  Follow up Falls prevention discussed - Falls evaluation completed Falls evaluation completed;Education provided;Falls prevention discussed -    FALL RISK PREVENTION PERTAINING TO THE HOME:  Any stairs in or around the home? No  Home free of loose throw rugs in walkways, pet beds, electrical cords, etc? Yes  Adequate lighting in your home to reduce risk of falls? Yes   ASSISTIVE DEVICES UTILIZED TO PREVENT FALLS:  Life alert? No  Use of a cane, walker or w/c? No  Grab bars in the bathroom? No  Shower chair or bench in shower? Yes  Elevated toilet seat or a handicapped toilet? No   TIMED UP AND GO:  Was the test performed? Yes .  Length of time to ambulate 10 feet: 10 sec.   Gait steady and fast without use of assistive device  Cognitive Function:Normal cognitive status assessed by direct observation by this Nurse Health Advisor. No abnormalities found.   MMSE - Mini Mental State Exam 10/16/2018 10/10/2017  Orientation to time 5 5  Orientation to Place 5 5  Registration 3 3  Attention/ Calculation 5 5  Recall 3 3  Language- name 2 objects 2 2  Language- repeat 1 1  Language- follow 3 step command 3 3  Language- read & follow direction 1 1  Write a sentence 1 1  Copy design 1 1  Total score 30 30     6CIT Screen 11/26/2019  What Year? 0 points  What month? 0 points  What time? 0 points  Count back from 20 0 points  Months in reverse 0 points  Repeat phrase 0 points  Total Score 0    Immunizations Immunization History  Administered Date(s) Administered  . H1N1 09/30/2008  . Influenza Split 06/28/2011  . Influenza Whole 05/06/2010  . Influenza, High Dose Seasonal PF 05/14/2020  . Influenza,inj,Quad PF,6+ Mos 07/08/2013, 07/03/2014, 06/05/2016, 04/13/2017, 04/08/2018, 04/15/2019  . Influenza-Unspecified 06/22/2015  . PFIZER(Purple  Top)SARS-COV-2  Vaccination 09/27/2019, 10/22/2019, 05/24/2020  . Pneumococcal Conjugate-13 04/01/2015  . Pneumococcal Polysaccharide-23 04/03/2016  . Td 05/22/2005  . Tdap 06/28/2011  . Zoster 01/14/2014    TDAP status: Up to date  Flu Vaccine status: Up to date  Pneumococcal vaccine status: Up to date  Covid-19 vaccine status: Completed vaccines  Qualifies for Shingles Vaccine? Yes   Zostavax completed Yes   Shingrix Completed?: No.    Education has been provided regarding the importance of this vaccine. Patient has been advised to call insurance company to determine out of pocket expense if they have not yet received this vaccine. Advised may also receive vaccine at local pharmacy or Health Dept. Verbalized acceptance and understanding.  Screening Tests Health Maintenance  Topic Date Due  . MAMMOGRAM  03/04/2021  . INFLUENZA VACCINE  03/21/2021  . TETANUS/TDAP  06/27/2021  . COLONOSCOPY (Pts 45-54yrs Insurance coverage will need to be confirmed)  08/04/2027  . DEXA SCAN  Completed  . COVID-19 Vaccine  Completed  . Hepatitis C Screening  Completed  . PNA vac Low Risk Adult  Completed  . HPV VACCINES  Aged Out    Health Maintenance  There are no preventive care reminders to display for this patient.  Colorectal cancer screening: Type of screening: Colonoscopy. Completed 08/03/2020. Repeat every 7 years  Mammogram status: Completed 02/2020 with GYN. Repeat every year Requested copy of results be sent to PCP  Bone Density status: Completed 03/05/2019. Results reflect: Bone density results: NORMAL. Repeat every 2 years.  Lung Cancer Screening: (Low Dose CT Chest recommended if Age 31-80 years, 30 pack-year currently smoking OR have quit w/in 15years.) does not qualify.    Additional Screening:  Hepatitis C Screening:  Completed 10/16/2018  Vision Screening: Recommended annual ophthalmology exams for early detection of glaucoma and other disorders of the eye. Is the  patient up to date with their annual eye exam?  Yes  Who is the provider or what is the name of the office in which the patient attends annual eye exams? [facility]   Dental Screening: Recommended annual dental exams for proper oral hygiene  Community Resource Referral / Chronic Care Management: CRR required this visit?  No   CCM required this visit?  No      Plan:     I have personally reviewed and noted the following in the patient's chart:   . Medical and social history . Use of alcohol, tobacco or illicit drugs  . Current medications and supplements . Functional ability and status . Nutritional status . Physical activity . Advanced directives . List of other physicians . Hospitalizations, surgeries, and ER visits in previous 12 months . Vitals . Screenings to include cognitive, depression, and falls . Referrals and appointments  In addition, I have reviewed and discussed with patient certain preventive protocols, quality metrics, and best practice recommendations. A written personalized care plan for preventive services as well as general preventive health recommendations were provided to patient.   Patient to access avs on mychart  Marta Antu, LPN   [id number]   Nurse Notes: None

## 2020-11-29 NOTE — Assessment & Plan Note (Signed)
Pt's PE WNL w/ exception of being overweight.  UTD on immunizations, pap, mammo, colonoscopy, DEXA.  Check labs.  Anticipatory guidance provided.

## 2020-11-29 NOTE — Assessment & Plan Note (Signed)
Chronic problem.  Tolerating statin w/o difficulty.  Check labs.  Adjust meds prn  

## 2020-11-29 NOTE — Patient Instructions (Signed)
Amy Perkins , Thank you for taking time to come for your Medicare Wellness Visit. I appreciate your ongoing commitment to your health goals. Please review the following plan we discussed and let me know if I can assist you in the future.   Screening recommendations/referrals: Colonoscopy: 08/03/2020-Due-08/04/2027 Mammogram: Completed with GYN 02/2020-Due 02/2021-Please have a copy of results sent to PCP. Bone Density: Completed 03/05/2019-Due 03/04/2021 Recommended yearly ophthalmology/optometry visit for glaucoma screening and checkup Recommended yearly dental visit for hygiene and checkup  Vaccinations: Influenza vaccine: Up to date Pneumococcal vaccine: Completed vaccines Tdap vaccine: Up to date-Due 06/27/2021 Shingles vaccine: Discuss with pharmacy Covid-19:Completed 3 vaccines  Advanced directives: Copy in chart  Conditions/risks identified: See problem list  Next appointment: Follow up in one year for your annual wellness visit    Preventive Care 71 Years and Older, Female Preventive care refers to lifestyle choices and visits with your health care provider that can promote health and wellness. What does preventive care include?  A yearly physical exam. This is also called an annual well check.  Dental exams once or twice a year.  Routine eye exams. Ask your health care provider how often you should have your eyes checked.  Personal lifestyle choices, including:  Daily care of your teeth and gums.  Regular physical activity.  Eating a healthy diet.  Avoiding tobacco and drug use.  Limiting alcohol use.  Practicing safe sex.  Taking low-dose aspirin every day.  Taking vitamin and mineral supplements as recommended by your health care provider. What happens during an annual well check? The services and screenings done by your health care provider during your annual well check will depend on your age, overall health, lifestyle risk factors, and family history of  disease. Counseling  Your health care provider may ask you questions about your:  Alcohol use.  Tobacco use.  Drug use.  Emotional well-being.  Home and relationship well-being.  Sexual activity.  Eating habits.  History of falls.  Memory and ability to understand (cognition).  Work and work Statistician.  Reproductive health. Screening  You may have the following tests or measurements:  Height, weight, and BMI.  Blood pressure.  Lipid and cholesterol levels. These may be checked every 5 years, or more frequently if you are over 14 years old.  Skin check.  Lung cancer screening. You may have this screening every year starting at age 71 if you have a 30-pack-year history of smoking and currently smoke or have quit within the past 15 years.  Fecal occult blood test (FOBT) of the stool. You may have this test every year starting at age 71.  Flexible sigmoidoscopy or colonoscopy. You may have a sigmoidoscopy every 5 years or a colonoscopy every 10 years starting at age 71.  Hepatitis C blood test.  Hepatitis B blood test.  Sexually transmitted disease (STD) testing.  Diabetes screening. This is done by checking your blood sugar (glucose) after you have not eaten for a while (fasting). You may have this done every 1-3 years.  Bone density scan. This is done to screen for osteoporosis. You may have this done starting at age 71.  Mammogram. This may be done every 1-2 years. Talk to your health care provider about how often you should have regular mammograms. Talk with your health care provider about your test results, treatment options, and if necessary, the need for more tests. Vaccines  Your health care provider may recommend certain vaccines, such as:  Influenza vaccine. This is recommended  every year.  Tetanus, diphtheria, and acellular pertussis (Tdap, Td) vaccine. You may need a Td booster every 10 years.  Zoster vaccine. You may need this after age  19.  Pneumococcal 13-valent conjugate (PCV13) vaccine. One dose is recommended after age 71.  Pneumococcal polysaccharide (PPSV23) vaccine. One dose is recommended after age 71. Talk to your health care provider about which screenings and vaccines you need and how often you need them. This information is not intended to replace advice given to you by your health care provider. Make sure you discuss any questions you have with your health care provider. Document Released: 09/03/2015 Document Revised: 04/26/2016 Document Reviewed: 06/08/2015 Elsevier Interactive Patient Education  2017 [place] Prevention in the Home Falls can cause injuries. They can happen to people of all ages. There are many things you can do to make your home safe and to help prevent falls. What can I do on the outside of my home?  Regularly fix the edges of walkways and driveways and fix any cracks.  Remove anything that might make you trip as you walk through a door, such as a raised step or threshold.  Trim any bushes or trees on the path to your home.  Use bright outdoor lighting.  Clear any walking paths of anything that might make someone trip, such as rocks or tools.  Regularly check to see if handrails are loose or broken. Make sure that both sides of any steps have handrails.  Any raised decks and porches should have guardrails on the edges.  Have any leaves, snow, or ice cleared regularly.  Use sand or salt on walking paths during winter.  Clean up any spills in your garage right away. This includes oil or grease spills. What can I do in the bathroom?  Use night lights.  Install grab bars by the toilet and in the tub and shower. Do not use towel bars as grab bars.  Use non-skid mats or decals in the tub or shower.  If you need to sit down in the shower, use a plastic, non-slip stool.  Keep the floor dry. Clean up any water that spills on the floor as soon as it happens.  Remove  soap buildup in the tub or shower regularly.  Attach bath mats securely with double-sided non-slip rug tape.  Do not have throw rugs and other things on the floor that can make you trip. What can I do in the bedroom?  Use night lights.  Make sure that you have a light by your bed that is easy to reach.  Do not use any sheets or blankets that are too big for your bed. They should not hang down onto the floor.  Have a firm chair that has side arms. You can use this for support while you get dressed.  Do not have throw rugs and other things on the floor that can make you trip. What can I do in the kitchen?  Clean up any spills right away.  Avoid walking on wet floors.  Keep items that you use a lot in easy-to-reach places.  If you need to reach something above you, use a strong step stool that has a grab bar.  Keep electrical cords out of the way.  Do not use floor polish or wax that makes floors slippery. If you must use wax, use non-skid floor wax.  Do not have throw rugs and other things on the floor that can make you trip. What  can I do with my stairs?  Do not leave any items on the stairs.  Make sure that there are handrails on both sides of the stairs and use them. Fix handrails that are broken or loose. Make sure that handrails are as long as the stairways.  Check any carpeting to make sure that it is firmly attached to the stairs. Fix any carpet that is loose or worn.  Avoid having throw rugs at the top or bottom of the stairs. If you do have throw rugs, attach them to the floor with carpet tape.  Make sure that you have a light switch at the top of the stairs and the bottom of the stairs. If you do not have them, ask someone to add them for you. What else can I do to help prevent falls?  Wear shoes that:  Do not have high heels.  Have rubber bottoms.  Are comfortable and fit you well.  Are closed at the toe. Do not wear sandals.  If you use a  stepladder:  Make sure that it is fully opened. Do not climb a closed stepladder.  Make sure that both sides of the stepladder are locked into place.  Ask someone to hold it for you, if possible.  Clearly mark and make sure that you can see:  Any grab bars or handrails.  First and last steps.  Where the edge of each step is.  Use tools that help you move around (mobility aids) if they are needed. These include:  Canes.  Walkers.  Scooters.  Crutches.  Turn on the lights when you go into a dark area. Replace any light bulbs as soon as they burn out.  Set up your furniture so you have a clear path. Avoid moving your furniture around.  If any of your floors are uneven, fix them.  If there are any pets around you, be aware of where they are.  Review your medicines with your doctor. Some medicines can make you feel dizzy. This can increase your chance of falling. Ask your doctor what other things that you can do to help prevent falls. This information is not intended to replace advice given to you by your health care provider. Make sure you discuss any questions you have with your health care provider. Document Released: 06/03/2009 Document Revised: 01/13/2016 Document Reviewed: 09/11/2014 Elsevier Interactive Patient Education  2017 [employer].

## 2020-11-29 NOTE — Progress Notes (Signed)
Subjective:    Patient ID: Amy Perkins, female    DOB: March 30, 1951, 70 y.o.   MRN: [id number]  HPI CPE- UTD on flu, Tdap, PNA, COVID.  UTD on pap, mammo, colonoscopy, DEXA.  Reviewed past medical, surgical, family and social histories.   Patient Care Team    Relationship Specialty Notifications Start End  Midge Minium, MD PCP - General   08/03/10   [name], MD Consulting Physician Gastroenterology  10/04/15   Marcial Pacas, MD Consulting Physician Neurology  10/04/15   Dian Queen, MD Consulting Physician Obstetrics and Gynecology  10/04/15   [facility]    10/10/17   Luberta Mutter, MD Consulting Physician Ophthalmology  10/10/17   Earley Favor  Dentistry  10/10/17   Cindie Crumbly  Dermatology  10/10/17   Tish Men, MD (Inactive) Consulting Physician Hematology  10/16/18   Milly Jakob, MD Consulting Physician Orthopedic Surgery  10/16/18     Health Maintenance  Topic Date Due  . MAMMOGRAM  03/04/2021  . INFLUENZA VACCINE  03/21/2021  . TETANUS/TDAP  06/27/2021  . COLONOSCOPY (Pts 45-71yrs Insurance coverage will need to be confirmed)  08/04/2027  . DEXA SCAN  Completed  . COVID-19 Vaccine  Completed  . Hepatitis C Screening  Completed  . PNA vac Low Risk Adult  Completed  . HPV VACCINES  Aged Out      Review of Systems Patient reports no vision/ hearing changes, adenopathy,fever, weight change,  persistant/recurrent hoarseness , swallowing issues, palpitations, edema, persistant/recurrent cough, hemoptysis, dyspnea (rest/exertional/paroxysmal nocturnal), gastrointestinal bleeding (melena, rectal bleeding), abdominal pain, significant heartburn, bowel changes, GU symptoms (dysuria, hematuria, incontinence), Gyn symptoms (abnormal  bleeding, pain),  syncope, focal weakness, memory loss, numbness & tingling, skin/hair/nail changes, abnormal bruising or bleeding, anxiety, or depression.   + daily chest discomfort.  Wakes feeling fine but chest tightness  occurs as the day goes on.  Pt reports she has just resumed exercising and is doing a lot of arm work.  sxs started 'a couple of weeks ago' and she feels may be related to her physical activity.  No tightness during exercise but as the day goes on.  This visit occurred during the SARS-CoV-2 public health emergency.  Safety protocols were in place, including screening questions prior to the visit, additional usage of staff PPE, and extensive cleaning of exam room while observing appropriate contact time as indicated for disinfecting solutions.       Objective:   Physical Exam General Appearance:    Alert, cooperative, no distress, appears stated age  Head:    Normocephalic, without obvious abnormality, atraumatic  Eyes:    PERRL, conjunctiva/corneas clear, EOM's intact, fundi    benign, both eyes  Ears:    Normal TM's and external ear canals, both ears  Nose:   Deferred due to COVID  Throat:   Neck:   Supple, symmetrical, trachea midline, no adenopathy;    Thyroid: no enlargement/tenderness/nodules  Back:     Symmetric, no curvature, ROM normal, no CVA tenderness  Lungs:     Clear to auscultation bilaterally, respirations unlabored  Chest Wall:    No tenderness or deformity   Heart:    Regular rate and rhythm, S1 and S2 normal, no murmur, rub   or gallop  Breast Exam:    Deferred to GYN  Abdomen:     Soft, non-tender, bowel sounds active all four quadrants,    no masses, no organomegaly  Genitalia:    Deferred to GYN  Rectal:    Extremities:   Extremities normal, atraumatic, no cyanosis or edema  Pulses:   2+ and symmetric all extremities  Skin:   Skin color, texture, turgor normal, no rashes or lesions  Lymph nodes:   Cervical, supraclavicular, and axillary nodes normal  Neurologic:   CNII-XII intact, normal strength, sensation and reflexes    throughout          Assessment & Plan:  Chest tightness- new.  Atypical in the fact that she doesn't have difficulty while exercising but  feels that sxs worsen as the day goes on.  This may point towards more musculoskeletal issue as she has recently resumed exercise or an allergy/asthma issue given the pollen levels.  EKG w/o obvious abnormality and virtually unchanged from 2015.  Pt to monitor her sxs and if tightness changes or worsens, will need cards referral.  Pt expressed understanding and is in agreement w/ plan.

## 2020-12-21 DIAGNOSIS — F3342 Major depressive disorder, recurrent, in full remission: Secondary | ICD-10-CM | POA: Diagnosis not present

## 2020-12-21 DIAGNOSIS — F411 Generalized anxiety disorder: Secondary | ICD-10-CM | POA: Diagnosis not present

## 2021-01-03 ENCOUNTER — Ambulatory Visit: Payer: Medicare PPO

## 2021-01-03 NOTE — Progress Notes (Signed)
   Covid-19 Vaccination Clinic  Name:  ELISABETTA MISHRA    MRN: [id number] DOB: 1950/10/09  01/03/2021  Ms. Upadhyay was observed post Covid-19 immunization for 15 minutes without incident. She was provided with Vaccine Information Sheet and instruction to access the V-Safe system.   Ms. Odom was instructed to call 911 with any severe reactions post vaccine: Marland Kitchen Difficulty breathing  . Swelling of face and throat  . A fast heartbeat  . A bad rash all over body  . Dizziness and weakness

## 2021-01-04 ENCOUNTER — Other Ambulatory Visit (HOSPITAL_BASED_OUTPATIENT_CLINIC_OR_DEPARTMENT_OTHER): Payer: Self-pay

## 2021-01-04 MED ORDER — PFIZER-BIONT COVID-19 VAC-TRIS 30 MCG/0.3ML IM SUSP
INTRAMUSCULAR | 0 refills | Status: DC
  Filled 2021-01-04: qty 0.3, 1d supply, fill #0

## 2021-01-08 ENCOUNTER — Other Ambulatory Visit: Payer: Self-pay | Admitting: Hematology & Oncology

## 2021-02-16 ENCOUNTER — Encounter: Payer: Self-pay | Admitting: *Deleted

## 2021-03-02 DIAGNOSIS — H2513 Age-related nuclear cataract, bilateral: Secondary | ICD-10-CM | POA: Diagnosis not present

## 2021-03-02 DIAGNOSIS — H5213 Myopia, bilateral: Secondary | ICD-10-CM | POA: Diagnosis not present

## 2021-03-08 ENCOUNTER — Other Ambulatory Visit: Payer: Self-pay | Admitting: Hematology & Oncology

## 2021-05-07 ENCOUNTER — Other Ambulatory Visit: Payer: Self-pay | Admitting: Family Medicine

## 2021-05-08 ENCOUNTER — Other Ambulatory Visit: Payer: Self-pay | Admitting: Hematology & Oncology

## 2021-05-16 DIAGNOSIS — E039 Hypothyroidism, unspecified: Secondary | ICD-10-CM | POA: Diagnosis not present

## 2021-05-16 DIAGNOSIS — Z1231 Encounter for screening mammogram for malignant neoplasm of breast: Secondary | ICD-10-CM | POA: Diagnosis not present

## 2021-05-16 DIAGNOSIS — N958 Other specified menopausal and perimenopausal disorders: Secondary | ICD-10-CM | POA: Diagnosis not present

## 2021-05-16 DIAGNOSIS — Z01419 Encounter for gynecological examination (general) (routine) without abnormal findings: Secondary | ICD-10-CM | POA: Diagnosis not present

## 2021-05-16 DIAGNOSIS — Z6829 Body mass index (BMI) 29.0-29.9, adult: Secondary | ICD-10-CM | POA: Diagnosis not present

## 2021-05-16 LAB — HM MAMMOGRAPHY

## 2021-05-19 DIAGNOSIS — L814 Other melanin hyperpigmentation: Secondary | ICD-10-CM | POA: Diagnosis not present

## 2021-05-19 DIAGNOSIS — D2261 Melanocytic nevi of right upper limb, including shoulder: Secondary | ICD-10-CM | POA: Diagnosis not present

## 2021-05-19 DIAGNOSIS — Z08 Encounter for follow-up examination after completed treatment for malignant neoplasm: Secondary | ICD-10-CM | POA: Diagnosis not present

## 2021-05-19 DIAGNOSIS — D225 Melanocytic nevi of trunk: Secondary | ICD-10-CM | POA: Diagnosis not present

## 2021-05-19 DIAGNOSIS — D492 Neoplasm of unspecified behavior of bone, soft tissue, and skin: Secondary | ICD-10-CM | POA: Diagnosis not present

## 2021-05-19 DIAGNOSIS — L910 Hypertrophic scar: Secondary | ICD-10-CM | POA: Diagnosis not present

## 2021-05-19 DIAGNOSIS — L821 Other seborrheic keratosis: Secondary | ICD-10-CM | POA: Diagnosis not present

## 2021-05-19 DIAGNOSIS — Z85828 Personal history of other malignant neoplasm of skin: Secondary | ICD-10-CM | POA: Diagnosis not present

## 2021-06-02 ENCOUNTER — Encounter: Payer: Self-pay | Admitting: Family Medicine

## 2021-06-02 ENCOUNTER — Ambulatory Visit: Payer: Medicare PPO | Admitting: Family Medicine

## 2021-06-02 ENCOUNTER — Other Ambulatory Visit: Payer: Self-pay

## 2021-06-02 VITALS — BP 121/80 | HR 67 | Temp 99.0°F | Resp 18 | Ht 63.3 in | Wt 166.2 lb

## 2021-06-02 DIAGNOSIS — E663 Overweight: Secondary | ICD-10-CM

## 2021-06-02 DIAGNOSIS — M722 Plantar fascial fibromatosis: Secondary | ICD-10-CM | POA: Diagnosis not present

## 2021-06-02 DIAGNOSIS — K219 Gastro-esophageal reflux disease without esophagitis: Secondary | ICD-10-CM

## 2021-06-02 DIAGNOSIS — E785 Hyperlipidemia, unspecified: Secondary | ICD-10-CM

## 2021-06-02 DIAGNOSIS — E038 Other specified hypothyroidism: Secondary | ICD-10-CM

## 2021-06-02 LAB — BASIC METABOLIC PANEL
BUN: 14 mg/dL (ref 6–23)
CO2: 30 mEq/L (ref 19–32)
Calcium: 9.3 mg/dL (ref 8.4–10.5)
Chloride: 102 mEq/L (ref 96–112)
Creatinine, Ser: 0.73 mg/dL (ref 0.40–1.20)
GFR: 82.81 mL/min (ref >60.00)
Glucose, Bld: 105 mg/dL — ABNORMAL HIGH (ref 70–99)
Potassium: 4.1 mEq/L (ref 3.5–5.1)
Sodium: 140 mEq/L (ref 135–145)

## 2021-06-02 LAB — LIPID PANEL
Cholesterol: 170 mg/dL (ref 0–200)
HDL: 49.7 mg/dL (ref >39.00)
LDL Cholesterol: 100 mg/dL — ABNORMAL HIGH (ref 0–99)
NonHDL: 120.07
Total CHOL/HDL Ratio: 3
Triglycerides: 102 mg/dL (ref 0.0–149.0)
VLDL: 20.4 mg/dL (ref 0.0–40.0)

## 2021-06-02 LAB — CBC WITH DIFFERENTIAL/PLATELET
Basophils Absolute: 0 10*3/uL (ref 0.0–0.1)
Basophils Relative: 0.7 % (ref 0.0–3.0)
Eosinophils Absolute: 0.2 10*3/uL (ref 0.0–0.7)
Eosinophils Relative: 3.1 % (ref 0.0–5.0)
HCT: 43.5 % (ref 36.0–46.0)
Hemoglobin: 14.7 g/dL (ref 12.0–15.0)
Lymphocytes Relative: 30.3 % (ref 12.0–46.0)
Lymphs Abs: 1.7 10*3/uL (ref 0.7–4.0)
MCHC: 33.9 g/dL (ref 30.0–36.0)
MCV: 88.7 fl (ref 78.0–100.0)
Monocytes Absolute: 0.4 10*3/uL (ref 0.1–1.0)
Monocytes Relative: 7.5 % (ref 3.0–12.0)
Neutro Abs: 3.4 10*3/uL (ref 1.4–7.7)
Neutrophils Relative %: 58.4 % (ref 43.0–77.0)
Platelets: 212 10*3/uL (ref 150.0–400.0)
RBC: 4.9 Mil/uL (ref 3.87–5.11)
RDW: 13.3 % (ref 11.5–15.5)
WBC: 5.7 10*3/uL (ref 4.0–10.5)

## 2021-06-02 LAB — TSH: TSH: 3.09 u[IU]/mL (ref 0.35–5.50)

## 2021-06-02 LAB — HEPATIC FUNCTION PANEL
ALT: 28 U/L (ref 0–35)
AST: 20 U/L (ref 0–37)
Albumin: 4.3 g/dL (ref 3.5–5.2)
Alkaline Phosphatase: 62 U/L (ref 39–117)
Bilirubin, Direct: 0.1 mg/dL (ref 0.0–0.3)
Total Bilirubin: 0.5 mg/dL (ref 0.2–1.2)
Total Protein: 6.2 g/dL (ref 6.0–8.3)

## 2021-06-02 MED ORDER — OMEPRAZOLE 20 MG PO CPDR: 20.0000 mg | DELAYED_RELEASE_CAPSULE | Freq: Every day | ORAL | 3 refills | Status: DC

## 2021-06-02 NOTE — Assessment & Plan Note (Signed)
Ongoing issue for pt.  She is exercising regularly- applauded her efforts.  Will follow.

## 2021-06-02 NOTE — Assessment & Plan Note (Signed)
Chronic problem.  Currently asymptomatic.  Check labs.  Adjust meds prn  

## 2021-06-02 NOTE — Progress Notes (Signed)
   Subjective:    Patient ID: Amy Perkins, female    DOB: March 06, 1951, 70 y.o.   MRN: [id number]  HPI Hyperlipidemia- chronic problem, on Simvastatin 40mg  daily.  Denies abd pain, N/V.  No CP, SOB  Hypothyroid- chronic problem, on Levothyroxine 25mcg daily.  No excessive fatigue.  Denies dry skin/hair/nails.  Overweight- ongoing issue for pt.  Wt is fairly stable.  BMI 29.16.  is walking regularly but wants to go back to the gym  Plantar fasciitis- L foot.  Painful to step in the morning.  Bought heel cushion w/ some improvement  Substernal ache- occurs at night when lying down.  Doesn't have issues during the day or with exertion.   Review of Systems For ROS see HPI   This visit occurred during the SARS-CoV-2 public health emergency.  Safety protocols were in place, including screening questions prior to the visit, additional usage of staff PPE, and extensive cleaning of exam room while observing appropriate contact time as indicated for disinfecting solutions.      Objective:   Physical Exam Vitals reviewed.  Constitutional:      General: She is not in acute distress.    Appearance: Normal appearance. She is well-developed. She is not ill-appearing.  HENT:     Head: Normocephalic and atraumatic.  Eyes:     Conjunctiva/sclera: Conjunctivae normal.     Pupils: Pupils are equal, round, and reactive to light.  Neck:     Thyroid: No thyromegaly.  Cardiovascular:     Rate and Rhythm: Normal rate and regular rhythm.     Pulses: Normal pulses.     Heart sounds: Normal heart sounds. No murmur heard. Pulmonary:     Effort: Pulmonary effort is normal. No respiratory distress.     Breath sounds: Normal breath sounds.  Abdominal:     General: There is no distension.     Palpations: Abdomen is soft.     Tenderness: There is no abdominal tenderness.  Musculoskeletal:     Cervical back: Normal range of motion and neck supple.     Right lower leg: No edema.     Left lower leg: No  edema.  Lymphadenopathy:     Cervical: No cervical adenopathy.  Skin:    General: Skin is warm and dry.  Neurological:     General: No focal deficit present.     Mental Status: She is alert and oriented to person, place, and time.  Psychiatric:        Mood and Affect: Mood normal.        Behavior: Behavior normal.          Assessment & Plan:   Plantar fasciitis- new.  L foot.  She bought heel cushions for her shoes w/ some improvement.  Encouraged supportive shoes w/ good arch support.  Showed her how to ice and stretch.  Pt expressed understanding and is in agreement w/ plan.

## 2021-06-02 NOTE — Assessment & Plan Note (Signed)
New.  Suspect this is cause of pt's substernal discomfort at night.  Start low dose Omeprazole and monitor for improvement.  Pt expressed understanding and is in agreement w/ plan.

## 2021-06-02 NOTE — Assessment & Plan Note (Signed)
Chronic problem.  Tolerating statin w/o difficulty.  Check labs.  Adjust meds prn  

## 2021-06-02 NOTE — Patient Instructions (Signed)
Schedule your complete physical in 6 months We'll notify you of your lab results and make any changes if needed Keep up the good work on healthy diet and regular exercise- you're doing great! Use a frozen water bottle to ice your foot Wear shoes w/ good arch support Start the Omeprazole once daily to improve acid and relieve that discomfort at night Call with any questions or concerns Stay Safe!  Stay Healthy! Happy Fall!!

## 2021-06-10 ENCOUNTER — Telehealth: Payer: Self-pay | Admitting: *Deleted

## 2021-06-10 ENCOUNTER — Other Ambulatory Visit: Payer: Self-pay | Admitting: Family

## 2021-06-10 DIAGNOSIS — I2699 Other pulmonary embolism without acute cor pulmonale: Secondary | ICD-10-CM

## 2021-06-10 DIAGNOSIS — R0602 Shortness of breath: Secondary | ICD-10-CM

## 2021-06-10 NOTE — Telephone Encounter (Signed)
Message received from patient stating that she has had "chest tightness since the Spring" was seen by her PCP who did an EKG which was OK, but continues to have "aching in chest"  Dr. Marin Olp notified.  Call placed back to patient and patient notified per order of Dr. Marin Olp that he will order a CT angiogram.  Pt is agreeable to plan and is appreciative of call back.

## 2021-06-13 DIAGNOSIS — F3341 Major depressive disorder, recurrent, in partial remission: Secondary | ICD-10-CM | POA: Diagnosis not present

## 2021-06-13 DIAGNOSIS — F411 Generalized anxiety disorder: Secondary | ICD-10-CM | POA: Diagnosis not present

## 2021-06-15 ENCOUNTER — Other Ambulatory Visit: Payer: Self-pay

## 2021-06-15 ENCOUNTER — Ambulatory Visit (HOSPITAL_BASED_OUTPATIENT_CLINIC_OR_DEPARTMENT_OTHER)
Admission: RE | Admit: 2021-06-15 | Discharge: 2021-06-15 | Disposition: A | Discharge status: Home / Self Care | Payer: Medicare PPO | Source: Ambulatory Visit | Attending: Family | Admitting: Family

## 2021-06-15 ENCOUNTER — Encounter (HOSPITAL_BASED_OUTPATIENT_CLINIC_OR_DEPARTMENT_OTHER): Payer: Self-pay

## 2021-06-15 DIAGNOSIS — I2699 Other pulmonary embolism without acute cor pulmonale: Secondary | ICD-10-CM | POA: Diagnosis not present

## 2021-06-15 DIAGNOSIS — R0602 Shortness of breath: Secondary | ICD-10-CM | POA: Insufficient documentation

## 2021-06-15 DIAGNOSIS — J9811 Atelectasis: Secondary | ICD-10-CM | POA: Diagnosis not present

## 2021-06-15 DIAGNOSIS — K8020 Calculus of gallbladder without cholecystitis without obstruction: Secondary | ICD-10-CM | POA: Diagnosis not present

## 2021-06-15 MED ORDER — IOHEXOL 350 MG/ML SOLN
75.0000 mL | Freq: Once | INTRAVENOUS | Status: AC | PRN
  Administered 2021-06-15: 75 mL via INTRAVENOUS

## 2021-06-16 ENCOUNTER — Telehealth: Payer: Self-pay | Admitting: *Deleted

## 2021-06-16 NOTE — Telephone Encounter (Signed)
Patient called and would like to have CT scan results. I instructed her that Dr. Marin Olp was out of the office this week and he will be back Monday. She verbalized understanding.

## 2021-06-20 ENCOUNTER — Telehealth: Payer: Self-pay | Admitting: *Deleted

## 2021-06-20 NOTE — Telephone Encounter (Signed)
Called patient regarding her CT angiogram after Dr Marin Olp reviewed.   Dr Marin Olp states that there is not anything concerning to him at this point.  However, in light of patients continuing symptoms of chest discomfort with SOB he recommends patient contacting her primary care physician to see if she had any recommendations.  Patient satisfied with this response and will look into it.

## 2021-06-22 ENCOUNTER — Telehealth: Payer: Self-pay

## 2021-06-22 NOTE — Telephone Encounter (Signed)
Caller name:Janes Paulla Fore   On DPR? :yes  Call back number:[phone]  Provider they see: Birdie Riddle   Reason for call:Pt wants a call back regarding her CT scan if she needs to see a pulmonologist

## 2021-06-23 NOTE — Telephone Encounter (Signed)
Called and spoke with patient about Ct scan concerns. Patient voiced understanding but patient did say she was having mild chest aches during the mid day up until she goes to bed at night but it does not keep her from sleeping. Patient takes 20mg   of Omeprazole that does not seem to help. Patient just would like to know if she should keep taking medication or should be doing something else. Please advise

## 2021-06-23 NOTE — Telephone Encounter (Signed)
I would try increasing the Omeprazole to 40mg  (2 tabs daily) and see if that helps w/ her sxs.  If not, we need to schedule an appt

## 2021-06-23 NOTE — Telephone Encounter (Signed)
After reviewing CT scan, I don't see a reason to go to pulmonary unless she is having symptoms such as shortness of breath, ongoing cough, or other respiratory concerns

## 2021-06-23 NOTE — Telephone Encounter (Signed)
Called and spoke with patient and informed her that per pcp take 2 pills daily for 40mg  of her omeprazole to help with sxs. Patient understood and will update [place] in the next couple of weeks on how she is feeling.

## 2021-07-08 ENCOUNTER — Other Ambulatory Visit: Payer: Self-pay | Admitting: Hematology & Oncology

## 2021-07-18 ENCOUNTER — Telehealth: Payer: Self-pay | Admitting: Family Medicine

## 2021-07-18 NOTE — Telephone Encounter (Signed)
Patient would like a referral to a GI doctor for Gallstones

## 2021-07-19 NOTE — Telephone Encounter (Signed)
Pt had a colonoscopy w/ Dr Ardis Hughs last year.  She can call their office to schedule an appt since she is already an established patient.

## 2021-07-19 NOTE — Telephone Encounter (Signed)
Spoke to patient and let her know, she stated she would call them tomorrow

## 2021-07-24 ENCOUNTER — Encounter: Payer: Self-pay | Admitting: Gastroenterology

## 2021-07-26 ENCOUNTER — Other Ambulatory Visit: Payer: Self-pay

## 2021-07-26 ENCOUNTER — Other Ambulatory Visit: Payer: Self-pay | Admitting: Family Medicine

## 2021-07-26 MED ORDER — OMEPRAZOLE 20 MG PO CPDR: 20.0000 mg | DELAYED_RELEASE_CAPSULE | Freq: Two times a day (BID) | ORAL | 3 refills | Status: DC

## 2021-08-10 ENCOUNTER — Other Ambulatory Visit: Payer: Self-pay | Admitting: Family Medicine

## 2021-08-12 ENCOUNTER — Other Ambulatory Visit: Payer: Self-pay

## 2021-08-12 ENCOUNTER — Encounter: Payer: Self-pay | Admitting: Family

## 2021-08-12 ENCOUNTER — Telehealth (INDEPENDENT_AMBULATORY_CARE_PROVIDER_SITE_OTHER): Payer: Medicare PPO | Admitting: Family

## 2021-08-12 VITALS — Temp 99.7°F | Ht 63.3 in | Wt 165.0 lb

## 2021-08-12 DIAGNOSIS — U071 COVID-19: Secondary | ICD-10-CM | POA: Diagnosis not present

## 2021-08-12 MED ORDER — AMOXICILLIN-POT CLAVULANATE 875-125 MG PO TABS: 1.0000 | ORAL_TABLET | Freq: Two times a day (BID) | ORAL | 0 refills | Status: DC

## 2021-08-12 NOTE — Progress Notes (Signed)
MyChart Video Visit    Virtual Visit via Video Note   This visit type was conducted due to national recommendations for restrictions regarding the COVID-19 Pandemic (e.g. social distancing) in an effort to limit this patient's exposure and mitigate transmission in our community. This patient is at least at moderate risk for complications without adequate follow up. This format is felt to be most appropriate for this patient at this time. Physical exam was limited by quality of the video and audio technology used for the visit. CMA was able to get the patient set up on a video visit.  Patient location: Home. Patient and provider in visit Provider location: Office  I discussed the limitations of evaluation and management by telemedicine and the availability of in person appointments. The patient expressed understanding and agreed to proceed.  Visit Date: 08/12/2021  Today's healthcare provider: Eugenia Pancoast, FNP     Subjective:    Patient ID: Amy Perkins, female    DOB: [birth year], 70 y.o.   MRN: [id number]  Chief Complaint  Patient presents with   Covid Positive   Fever   Nasal Congestion   Cough   Headache    Fever  Associated symptoms include congestion, coughing and headaches. Pertinent negatives include no ear pain or sore throat.  Cough Associated symptoms include a fever (low grade 99.7) and headaches. Pertinent negatives include no ear pain or sore throat.  Headache  Associated symptoms include coughing and a fever (low grade 99.7). Pertinent negatives include no ear pain or sore throat.   Pt here with c/o nasal congestion, fever (low grade fever 99.7, cough (dry non productive)and headache. Fever went up to  Tested positive for covid last night. Symptoms have been for the last five days. Taking acetaminophen with some relief of the headache.sx have worsened over the last two days. Throat is a bit scratchy.   Past Medical History:  Diagnosis Date    Depression    Hyperlipidemia    Migraines    Rectal bleeding 05/26/2020   Squamous cell carcinoma 09/20/2017   chest    Past Surgical History:  Procedure Laterality Date   DILATION AND CURETTAGE OF UTERUS     x2   laser vein surgery     TONSILLECTOMY      Family History  Problem Relation Age of Onset   Coronary artery disease Father    Stroke Father    Parkinson's disease Father    Hypertension Mother    Diabetes Mother    Stroke Mother    Coronary artery disease Brother    Factor V Leiden deficiency Daughter    Heart disease Paternal Grandfather    Colon cancer Neg Hx    Esophageal cancer Neg Hx    Pancreatic cancer Neg Hx    Stomach cancer Neg Hx    Rectal cancer Neg Hx     Social History   Socioeconomic History   Marital status: Divorced    Spouse name: Not on file   Number of children: 2   Years of education: college   Highest education level: Not on file  Occupational History   Occupation: Product manager: [employer]    Comment: [employer]  Tobacco Use   Smoking status: Never   Smokeless tobacco: Never  Vaping Use   Vaping Use: Never used  Substance and Sexual Activity   Alcohol use: Yes    Alcohol/week: 4.0 standard drinks    Types: 4 Glasses of wine per  week    Comment: 4 glass week   Drug use: No   Sexual activity: Never  Other Topics Concern   Not on file  Social History Narrative   Divorced, both children live locally.   Patient works part time at [employer]. College education.   Left handed.   Caffeine- two cups coffee daily.   Social Determinants of Health   Financial Resource Strain: Low Risk    Difficulty of Paying Living Expenses: Not hard at all  Food Insecurity: No Food Insecurity   Worried About Charity fundraiser in the Last Year: Never true   [place] in the Last Year: Never true  Transportation Needs: No Transportation Needs   Lack of Transportation (Medical): No   Lack of Transportation (Non-Medical): No   Physical Activity: Sufficiently Active   Days of Exercise per Week: 7 days   Minutes of Exercise per Session: 60 min  Stress: No Stress Concern Present   Feeling of Stress : Not at all  Social Connections: Moderately Isolated   Frequency of Communication with Friends and Family: More than three times a week   Frequency of Social Gatherings with Friends and Family: More than three times a week   Attends Religious Services: More than 4 times per year   Active Member of [employer] or Organizations: No   Attends Archivist Meetings: Never   Marital Status: Divorced  Human resources officer Violence: Not At Risk   Fear of Current or Ex-Partner: No   Emotionally Abused: No   Physically Abused: No   Sexually Abused: No    Outpatient Medications Prior to Visit  Medication Sig Dispense Refill   ALPRAZolam (XANAX) 0.5 MG tablet Take 0.5 mg by mouth See admin instructions. Take 1/2 tablet by mouth 2 times daily (morning and 3PM) , and 1 tablet at bedtime.     citalopram (CELEXA) 40 MG tablet Take 1 tablet (40 mg total) by mouth daily. 30 tablet 5   COVID-19 mRNA Vac-TriS, Pfizer, (PFIZER-BIONT COVID-19 VAC-TRIS) SUSP injection Inject into the muscle. 0.3 mL 0   ELIQUIS 5 MG TABS tablet TAKE 1 TABLET BY MOUTH TWICE A DAY 60 tablet 1   levothyroxine (SYNTHROID) 50 MCG tablet TAKE 1 TABLET BY MOUTH EVERY DAY 90 tablet 1   omeprazole (PRILOSEC) 20 MG capsule TAKE 1 CAPSULE BY MOUTH TWICE A DAY 180 capsule 1   simvastatin (ZOCOR) 40 MG tablet TAKE 1 TABLET BY MOUTH EVERYDAY AT BEDTIME 90 tablet 1   Biotin 1000 MCG CHEW Chew by mouth. (Patient not taking: Reported on 11/29/2020)     No facility-administered medications prior to visit.    No Known Allergies  Review of Systems  Constitutional:  Positive for fatigue and fever (low grade 99.7).  HENT:  Positive for congestion. Negative for ear pain and sore throat.   Respiratory:  Positive for cough.   Neurological:  Positive for headaches.       Objective:    Physical Exam Constitutional:      Appearance: She is well-developed.  HENT:     Head: Normocephalic.  Neurological:     Mental Status: She is alert.  Psychiatric:        Mood and Affect: Mood normal.        Speech: Speech normal.        Behavior: Behavior normal.    Temp 99.7 F (37.6 C) (Oral)    Ht 5' 3.3" (1.608 m)    Wt 165  lb (74.8 kg)    BMI 28.95 kg/m  Wt Readings from Last 3 Encounters:  08/12/21 165 lb (74.8 kg)  06/02/21 166 lb 3.2 oz (75.4 kg)  11/29/20 164 lb (74.4 kg)       Assessment & Plan:   Problem List Items Addressed This Visit       Other   COVID-19 - Primary    An antibiotic was prescribed to your preferred pharmacy today, please pick up and take as directed. Advised of CDC guidelines for self isolation/ ending isolation.  Advised of safe practice guidelines. Symptom Tier reviewed.  Encouraged to monitor for any worsening symptoms; watch for increased shortness of breath, weakness, and signs of dehydration. Advised when to seek emergency care.  Instructed to rest and hydrate well.  Advised to leave the house during recommended isolation period, only if it is necessary to seek medical care       Relevant Medications   amoxicillin-clavulanate (AUGMENTIN) 875-125 MG tablet    I am having Amy Perkins start on amoxicillin-clavulanate. I am also having her maintain her ALPRAZolam, citalopram, Biotin, Pfizer-BioNT COVID-19 Vac-TriS, levothyroxine, simvastatin, Eliquis, and omeprazole.  Meds ordered this encounter  Medications   amoxicillin-clavulanate (AUGMENTIN) 875-125 MG tablet    Sig: Take 1 tablet by mouth 2 (two) times daily.    Dispense:  20 tablet    Refill:  0    Order Specific Question:   Supervising Provider    Answer:   BEDSOLE, AMY E [[id number]]    I discussed the assessment and treatment plan with the patient. The patient was provided an opportunity to ask questions and all were answered. The patient agreed with the  plan and demonstrated an understanding of the instructions.   The patient was advised to call back or seek an in-person evaluation if the symptoms worsen or if the condition fails to improve as anticipated.  I provided 15 minutes of face-to-face time during this encounter.   Eugenia Pancoast, [facility] [place] [facility] [place] [phone] (phone) [phone] (fax)  [place]

## 2021-08-12 NOTE — Assessment & Plan Note (Signed)
An antibiotic was prescribed to your preferred pharmacy today, please pick up and take as directed. Advised of CDC guidelines for self isolation/ ending isolation.  Advised of safe practice guidelines. Symptom Tier reviewed.  Encouraged to monitor for any worsening symptoms; watch for increased shortness of breath, weakness, and signs of dehydration. Advised when to seek emergency care.  Instructed to rest and hydrate well.  Advised to leave the house during recommended isolation period, only if it is necessary to seek medical care

## 2021-08-12 NOTE — Patient Instructions (Signed)
Your COVID19 PCR test is POSITIVE.  Let us know right away if any worsening shortness of breath or persistent productive cough or fever.   Follow these current CDC guidelines for self-isolation:  - Stay home for 5 days, starting the day after your symptoms (The first day is really day 0). - If you have no symptoms or your symptoms are resolving after 5 days, you can leave your house. - Continue to wear a mask around others for 5 additional days. **If you have a fever, continue to stay home until your fever resolves for 24 hours without fever-reducing medications.**

## 2021-09-07 ENCOUNTER — Other Ambulatory Visit: Payer: Self-pay | Admitting: Hematology & Oncology

## 2021-09-09 ENCOUNTER — Encounter: Payer: Self-pay | Admitting: Gastroenterology

## 2021-09-09 ENCOUNTER — Ambulatory Visit: Payer: Medicare PPO | Admitting: Gastroenterology

## 2021-09-09 VITALS — BP 112/80 | HR 68 | Ht 63.0 in | Wt 168.0 lb

## 2021-09-09 DIAGNOSIS — R0781 Pleurodynia: Secondary | ICD-10-CM

## 2021-09-09 NOTE — Progress Notes (Signed)
Review of pertinent gastrointestinal problems: 1.  History of adenomatous colon polyps.  Colonoscopy December 2021 for routine risk colon cancer screening found a single subcentimeter adenoma.   HPI: This is a very pleasant 70 year old woman who was referred to me by Midge Minium, MD  to evaluate chest pains, gallstones.   For about a year now she has been having pleuritic chest pains.  The pain is midsternum and it radiates to her upper back.  The pain is constantly there and it is worse definitely if she takes a deep breath.  It is not associate with eating, it is not associate with nausea or vomiting.  Is not associate with exercise.  She walks her dog several times a day and is still does not get short of breath and chest pains do not get worse.  She was put on a proton pump inhibitor 20 mg once daily and did not make any improvement.  This was doubled.  She takes 40 mg Prilosec shortly for her dinner meal every night and she has not noticed any improvements.  Prior to this she never had typical GERD related symptoms.  Specifically no pyrosis, no acid regurg, no dysphagia.  I last saw her at the time of a screening colonoscopy a little over a year ago.  See that results summarized above.  She is here today to discuss a new problem.  CT angio chest, PE protocol October 2022 indication "chest aching and shortness of breath.  History of pulmonary embolism."  Findings no evidence of recurrent acute pulmonary embolus.  Gallstones.  Blood work October 2022 CBC was normal, complete metabolic profile was normal    Review of systems: Pertinent positive and negative review of systems were noted in the above HPI section. All other review negative.   Past Medical History:  Diagnosis Date   Depression    Hyperlipidemia    Migraines    Rectal bleeding 05/26/2020   Squamous cell carcinoma 09/20/2017   chest    Past Surgical History:  Procedure Laterality Date   DILATION AND CURETTAGE  OF UTERUS     x2   laser vein surgery     TONSILLECTOMY      Current Outpatient Medications  Medication Sig Dispense Refill   ALPRAZolam (XANAX) 0.5 MG tablet Take 0.5 mg by mouth See admin instructions. Take 1/2 tablet by mouth 2 times daily (morning and 3PM) , and 1 tablet at bedtime.     Biotin 1000 MCG CHEW Chew by mouth.     citalopram (CELEXA) 40 MG tablet Take 1 tablet (40 mg total) by mouth daily. 30 tablet 5   COVID-19 mRNA Vac-TriS, Pfizer, (PFIZER-BIONT COVID-19 VAC-TRIS) SUSP injection Inject into the muscle. 0.3 mL 0   ELIQUIS 5 MG TABS tablet TAKE 1 TABLET BY MOUTH TWICE A DAY 60 tablet 1   levothyroxine (SYNTHROID) 50 MCG tablet TAKE 1 TABLET BY MOUTH EVERY DAY 90 tablet 1   omeprazole (PRILOSEC) 20 MG capsule TAKE 1 CAPSULE BY MOUTH TWICE A DAY 180 capsule 1   simvastatin (ZOCOR) 40 MG tablet TAKE 1 TABLET BY MOUTH EVERYDAY AT BEDTIME 90 tablet 1   No current facility-administered medications for this visit.    Allergies as of 09/09/2021   (No Known Allergies)    Family History  Problem Relation Age of Onset   Coronary artery disease Father    Stroke Father    Parkinson's disease Father    Hypertension Mother    Diabetes Mother  Stroke Mother    Coronary artery disease Brother    Factor V Leiden deficiency Daughter    Heart disease Paternal Grandfather    Colon cancer Neg Hx    Esophageal cancer Neg Hx    Pancreatic cancer Neg Hx    Stomach cancer Neg Hx    Rectal cancer Neg Hx     Social History   Socioeconomic History   Marital status: Divorced    Spouse name: Not on file   Number of children: 2   Years of education: college   Highest education level: Not on file  Occupational History   Occupation: Product manager: [employer]    Comment: [employer]  Tobacco Use   Smoking status: Never   Smokeless tobacco: Never  Vaping Use   Vaping Use: Never used  Substance and Sexual Activity   Alcohol use: Yes    Alcohol/week: 4.0 standard drinks     Types: 4 Glasses of wine per week    Comment: 4 glass week   Drug use: No   Sexual activity: Never  Other Topics Concern   Not on file  Social History Narrative   Divorced, both children live locally.   Patient works part time at [employer]. College education.   Left handed.   Caffeine- two cups coffee daily.   Social Determinants of Health   Financial Resource Strain: Low Risk    Difficulty of Paying Living Expenses: Not hard at all  Food Insecurity: No Food Insecurity   Worried About Charity fundraiser in the Last Year: Never true   [place] in the Last Year: Never true  Transportation Needs: No Transportation Needs   Lack of Transportation (Medical): No   Lack of Transportation (Non-Medical): No  Physical Activity: Sufficiently Active   Days of Exercise per Week: 7 days   Minutes of Exercise per Session: 60 min  Stress: No Stress Concern Present   Feeling of Stress : Not at all  Social Connections: Moderately Isolated   Frequency of Communication with Friends and Family: More than three times a week   Frequency of Social Gatherings with Friends and Family: More than three times a week   Attends Religious Services: More than 4 times per year   Active Member of [employer] or Organizations: No   Attends Music therapist: Never   Marital Status: Divorced  Human resources officer Violence: Not At Risk   Fear of Current or Ex-Partner: No   Emotionally Abused: No   Physically Abused: No   Sexually Abused: No     Physical Exam: BP 112/80    Pulse 68    Ht 5\' 3"  (1.6 m)    Wt 168 lb (76.2 kg)    SpO2 98%    BMI 29.76 kg/m  Constitutional: generally well-appearing Psychiatric: alert and oriented x3 Eyes: extraocular movements intact Mouth: oral pharynx moist, no lesions Neck: supple no lymphadenopathy Cardiovascular: heart regular rate and rhythm Lungs: clear to auscultation bilaterally Abdomen: soft, nontender, nondistended, no obvious ascites, no  peritoneal signs, normal bowel sounds Extremities: no lower extremity edema bilaterally Skin: no lesions on visible extremities   Assessment and plan: 71 y.o. female with chronic pleuritic chest pain, history of PE, incidental gallstones  I do not think gallstones are causing these discomforts.  I also do not think GERD is causing these discomforts however I think to be safe she will stay on proton pump inhibitor once daily for now.  She  never had classic GERD symptoms of pyrosis, acid regurg and she has no dysphagia.  She has no nausea, no vomiting.  Her pains are not postprandial.  She has constant pressure substernal that is worse when she takes a deep breath, this has been going on for about a year.  I am going to refer her to a pulmonologist for their input.  I do not think this is a GI related discomfort.   Please see the "Patient Instructions" section for addition details about the plan.   Owens Loffler, MD [facility] 09/09/2021, 3:37 PM  Cc: Midge Minium, MD  Total time on date of encounter was 45  minutes (this included time spent preparing to see the patient reviewing records; obtaining and/or reviewing separately obtained history; performing a medically appropriate exam and/or evaluation; counseling and educating the patient and family if present; ordering medications, tests or procedures if applicable; and documenting clinical information in the health record).

## 2021-09-09 NOTE — Patient Instructions (Signed)
If you are age 72 or older, your body mass index should be between 23-30. Your Body mass index is 29.76 kg/m. If this is out of the aforementioned range listed, please consider follow up with your Primary Care Provider. ________________________________________________________  The [facility] providers would like to encourage you to use [facility] to communicate with providers for non-urgent requests or questions.  Due to long hold times on the telephone, sending your provider a message by [facility] may be a faster and more efficient way to get a response.  Please allow 48 business hours for a response.  Please remember that this is for non-urgent requests.  _______________________________________________________  We have referred you to pulmonology.  Some one will contact you with an appointment.  If you have not heard from their office within 2 weeks, please call our office to make [place] aware.  Thank you for entrusting me with your care and choosing [facility].  Dr Ardis Hughs

## 2021-09-26 ENCOUNTER — Other Ambulatory Visit: Payer: Self-pay

## 2021-09-26 ENCOUNTER — Inpatient Hospital Stay: Payer: Medicare PPO | Attending: Hematology & Oncology

## 2021-09-26 ENCOUNTER — Encounter: Payer: Self-pay | Admitting: Hematology & Oncology

## 2021-09-26 ENCOUNTER — Inpatient Hospital Stay: Payer: Medicare PPO | Admitting: Hematology & Oncology

## 2021-09-26 VITALS — BP 119/60 | HR 61 | Temp 97.7°F | Resp 18 | Wt 165.0 lb

## 2021-09-26 DIAGNOSIS — D6851 Activated protein C resistance: Secondary | ICD-10-CM

## 2021-09-26 DIAGNOSIS — Z86711 Personal history of pulmonary embolism: Secondary | ICD-10-CM | POA: Diagnosis not present

## 2021-09-26 DIAGNOSIS — Z7901 Long term (current) use of anticoagulants: Secondary | ICD-10-CM | POA: Insufficient documentation

## 2021-09-26 DIAGNOSIS — R0789 Other chest pain: Secondary | ICD-10-CM | POA: Diagnosis not present

## 2021-09-26 DIAGNOSIS — Z79899 Other long term (current) drug therapy: Secondary | ICD-10-CM | POA: Diagnosis not present

## 2021-09-26 LAB — CBC WITH DIFFERENTIAL (CANCER CENTER ONLY)
Abs Immature Granulocytes: 0.01 10*3/uL (ref 0.00–0.07)
Basophils Absolute: 0 10*3/uL (ref 0.0–0.1)
Basophils Relative: 1 %
Eosinophils Absolute: 0.2 10*3/uL (ref 0.0–0.5)
Eosinophils Relative: 3 %
HCT: 42.6 % (ref 36.0–46.0)
Hemoglobin: 14.3 g/dL (ref 12.0–15.0)
Immature Granulocytes: 0 %
Lymphocytes Relative: 31 %
Lymphs Abs: 1.7 10*3/uL (ref 0.7–4.0)
MCH: 30.1 pg (ref 26.0–34.0)
MCHC: 33.6 g/dL (ref 30.0–36.0)
MCV: 89.7 fL (ref 80.0–100.0)
Monocytes Absolute: 0.4 10*3/uL (ref 0.1–1.0)
Monocytes Relative: 7 %
Neutro Abs: 3.2 10*3/uL (ref 1.7–7.7)
Neutrophils Relative %: 58 %
Platelet Count: 180 10*3/uL (ref 150–400)
RBC: 4.75 MIL/uL (ref 3.87–5.11)
RDW: 12.7 % (ref 11.5–15.5)
WBC Count: 5.6 10*3/uL (ref 4.0–10.5)
nRBC: 0 % (ref 0.0–0.2)

## 2021-09-26 LAB — CMP (CANCER CENTER ONLY)
ALT: 29 U/L (ref 0–44)
AST: 19 U/L (ref 15–41)
Albumin: 4.1 g/dL (ref 3.5–5.0)
Alkaline Phosphatase: 58 U/L (ref 38–126)
Anion gap: 5 (ref 5–15)
BUN: 15 mg/dL (ref 8–23)
CO2: 30 mmol/L (ref 22–32)
Calcium: 9.5 mg/dL (ref 8.9–10.3)
Chloride: 103 mmol/L (ref 98–111)
Creatinine: 0.77 mg/dL (ref 0.44–1.00)
GFR, Estimated: >60 mL/min (ref >60)
Glucose, Bld: 90 mg/dL (ref 70–99)
Potassium: 4 mmol/L (ref 3.5–5.1)
Sodium: 138 mmol/L (ref 135–145)
Total Bilirubin: 0.4 mg/dL (ref 0.3–1.2)
Total Protein: 5.9 g/dL — ABNORMAL LOW (ref 6.5–8.1)

## 2021-09-26 LAB — D-DIMER, QUANTITATIVE: D-Dimer, Quant: 0.46 ug/mL-FEU (ref 0.00–0.50)

## 2021-09-26 NOTE — Progress Notes (Signed)
D-dimer 

## 2021-09-26 NOTE — Progress Notes (Signed)
Hematology and Oncology Follow Up Visit  Amy Perkins [id number] 11-19-1950 71 y.o. 09/26/2021   Principle Diagnosis:  Pulmonary Embolism -- Facor V Leiden/Prothrombin II mutation  Current Therapy:   Eliquis 5 mg po BID -- lifelong     Interim History:  Amy Perkins is back for follow-up.  She comes yearly.  Since we last saw her, she did have a CT angiogram of the chest back in October.  She does have some chest pain.  The CT scan not show any evidence of pulmonary embolism.  She had some nonspecific interstitial changes.  She had improved aeration from the last scan.  She had a stone in the gallbladder.  There is no adenopathy in the chest.  I find it interesting that she then had COVID over the Christmas holidays.  She has had severe 13 days to become COVID-negative.  She still has some chest wall discomfort.  I do not know if this is costochondritis.  She sees a pulmonologist about this.  Her oxygen saturation is doing quite well at 96%.  She has had no fever.  There is no cough.  She has had no weight loss or weight gain.  There is no change in bowel or bladder habits.  She has had no leg swelling.  Overall, I would say her performance status is probably ECOG 1.    Medications:  Current Outpatient Medications:    ALPRAZolam (XANAX) 0.5 MG tablet, Take 0.5 mg by mouth See admin instructions. Take 1/2 tablet by mouth 2 times daily (morning and 3PM) , and 1 tablet at bedtime., Disp: , Rfl:    Biotin 1000 MCG CHEW, Chew by mouth., Disp: , Rfl:    citalopram (CELEXA) 40 MG tablet, Take 1 tablet (40 mg total) by mouth daily., Disp: 30 tablet, Rfl: 5   COVID-19 mRNA Vac-TriS, Pfizer, (PFIZER-BIONT COVID-19 VAC-TRIS) SUSP injection, Inject into the muscle., Disp: 0.3 mL, Rfl: 0   ELIQUIS 5 MG TABS tablet, TAKE 1 TABLET BY MOUTH TWICE A DAY, Disp: 60 tablet, Rfl: 1   levothyroxine (SYNTHROID) 50 MCG tablet, TAKE 1 TABLET BY MOUTH EVERY DAY, Disp: 90 tablet, Rfl: 1   omeprazole (PRILOSEC)  20 MG capsule, TAKE 1 CAPSULE BY MOUTH TWICE A DAY, Disp: 180 capsule, Rfl: 1   simvastatin (ZOCOR) 40 MG tablet, TAKE 1 TABLET BY MOUTH EVERYDAY AT BEDTIME, Disp: 90 tablet, Rfl: 1  Allergies: No Known Allergies  Past Medical History, Surgical history, Social history, and Family History were reviewed and updated.  Review of Systems: Review of Systems  Constitutional: Negative.   HENT:  Negative.    Eyes: Negative.   Respiratory: Negative.    Cardiovascular: Negative.   Gastrointestinal: Negative.   Endocrine: Negative.   Genitourinary: Negative.    Musculoskeletal: Negative.   Skin: Negative.   Neurological: Negative.   Hematological: Negative.   Psychiatric/Behavioral: Negative.     Physical Exam:  weight is 165 lb (74.8 kg). Her oral temperature is 97.7 F (36.5 C). Her blood pressure is 119/60 and her pulse is 61. Her respiration is 18 and oxygen saturation is 96%.   Wt Readings from Last 3 Encounters:  09/26/21 165 lb (74.8 kg)  09/09/21 168 lb (76.2 kg)  08/12/21 165 lb (74.8 kg)    Physical Exam Vitals reviewed.  HENT:     Head: Normocephalic and atraumatic.  Eyes:     Pupils: Pupils are equal, round, and reactive to light.  Cardiovascular:     Rate and  Rhythm: Normal rate and regular rhythm.     Heart sounds: Normal heart sounds.  Pulmonary:     Effort: Pulmonary effort is normal.     Breath sounds: Normal breath sounds.  Abdominal:     General: Bowel sounds are normal.     Palpations: Abdomen is soft.  Musculoskeletal:        General: No tenderness or deformity. Normal range of motion.     Cervical back: Normal range of motion.  Lymphadenopathy:     Cervical: No cervical adenopathy.  Skin:    General: Skin is warm and dry.     Findings: No erythema or rash.  Neurological:     Mental Status: She is alert and oriented to person, place, and time.  Psychiatric:        Behavior: Behavior normal.        Thought Content: Thought content normal.         Judgment: Judgment normal.   Lab Results  Component Value Date   WBC 5.6 09/26/2021   HGB 14.3 09/26/2021   HCT 42.6 09/26/2021   MCV 89.7 09/26/2021   PLT 180 09/26/2021     Chemistry      Component Value Date/Time   NA 140 06/02/2021 0912   K 4.1 06/02/2021 0912   CL 102 06/02/2021 0912   CO2 30 06/02/2021 0912   BUN 14 06/02/2021 0912   CREATININE 0.73 06/02/2021 0912   CREATININE 0.75 09/24/2020 0931      Component Value Date/Time   CALCIUM 9.3 06/02/2021 0912   ALKPHOS 62 06/02/2021 0912   AST 20 06/02/2021 0912   AST 17 09/24/2020 0931   ALT 28 06/02/2021 0912   ALT 29 09/24/2020 0931   BILITOT 0.5 06/02/2021 0912   BILITOT 0.4 09/24/2020 0931      Impression and Plan: Amy Perkins is a very charming 71 year old white female.  She has 2 hypercoagulable state.  She had a pulmonary embolism.  She is on lifelong anticoagulation.  I am not sure how to explain the chest wall pain.  Again, she may have costochondritis.  We will have to see what the pulmonologist says.  Today, the D-dimer was 0.46.  We will plan to get her back in another year.  It is possible that the chest discomfort could be from the blood clot that she had.  It certainly would be worthwhile to do another CT scan of her chest.  I am sure that the pulmonologist will order this.   Volanda Napoleon, MD 2/6/202310:26 AM

## 2021-09-28 ENCOUNTER — Ambulatory Visit: Payer: Medicare PPO | Admitting: Podiatry

## 2021-09-28 ENCOUNTER — Ambulatory Visit (INDEPENDENT_AMBULATORY_CARE_PROVIDER_SITE_OTHER): Payer: Medicare PPO

## 2021-09-28 ENCOUNTER — Other Ambulatory Visit: Payer: Self-pay

## 2021-09-28 DIAGNOSIS — M79674 Pain in right toe(s): Secondary | ICD-10-CM | POA: Diagnosis not present

## 2021-09-28 DIAGNOSIS — B351 Tinea unguium: Secondary | ICD-10-CM | POA: Diagnosis not present

## 2021-09-28 DIAGNOSIS — M722 Plantar fascial fibromatosis: Secondary | ICD-10-CM | POA: Diagnosis not present

## 2021-09-28 MED ORDER — METHYLPREDNISOLONE 4 MG PO TBPK: ORAL_TABLET | ORAL | 0 refills | Status: DC

## 2021-09-28 MED ORDER — BETAMETHASONE SOD PHOS & ACET 6 (3-3) MG/ML IJ SUSP: 3.0000 mg | Freq: Once | INTRAMUSCULAR | Status: DC

## 2021-09-28 NOTE — Progress Notes (Signed)
° °  Subjective: 71 y.o. female presenting as a new patient for evaluation of left heel pain specifically.  She has been dealing with left heel pain for about 4 to 5 months now.  She cannot recall an incident or injury.  She says that the pain is slowly increased over the past several months.  She did purchase a pair of Brooks running shoes and insoles to help alleviate some of the pain but it has not resolved completely Patient also states that about 1 year ago she had an injury to the right hallux nail plate and it has thickened and is loosely attached.  She would like to have it addressed.  She has not done anything for treatment  Past Medical History:  Diagnosis Date   Depression    Hyperlipidemia    Migraines    Rectal bleeding 05/26/2020   Squamous cell carcinoma 09/20/2017   chest    Objective: Physical Exam General: The patient is alert and oriented x3 in no acute distress.  Dermatology: Skin is warm, dry and supple bilateral lower extremities. Negative for open lesions or macerations bilateral.  Hyperkeratotic dystrophic nail noted to the right hallux nail plate which is loosely adhered to the underlying nail bed.  Vascular: Dorsalis Pedis and Posterior Tibial pulses palpable bilateral.  Capillary fill time is immediate to all digits.  Neurological: Epicritic and protective threshold intact bilateral.   Musculoskeletal: Tenderness to palpation to the plantar aspect of the left heel along the plantar fascia. All other joints range of motion within normal limits bilateral. Strength 5/5 in all groups bilateral.   Radiographic exam: Normal osseous mineralization. Joint spaces preserved. No fracture/dislocation/boney destruction. No other soft tissue abnormalities or radiopaque foreign bodies.   Assessment: 1. Plantar fasciitis left foot 2.  Dystrophic toenail right hallux secondary to injury approximately 1 year ago  Plan of Care:  1. Patient evaluated. Xrays reviewed.   2.  Injection of 0.5cc Celestone soluspan injected into the left plantar fascia.  3. Rx for Medrol Dose Pak placed 4.  Continue Tylenol OTC as needed.  Patient is on anticoagulant and cannot take NSAIDs 5. Plantar fascial band(s) dispensed  6. Instructed patient regarding therapies and modalities at home to alleviate symptoms.  7.  Mechanical debridement of the right hallux nail plate was performed all the way back to the base of the nail.  It was mostly detached and mechanical debridement was performed using a nail nipper without incident or bleeding.  We will allow the nail to hopefully grow out uneventfully over the next several months  8.  Return to clinic in 3 weeks   Edrick Kins, DPM [facility]  Dr. Edrick Kins, DPM    [address], [place] [address]                Office [phone]  Fax [phone]

## 2021-10-04 ENCOUNTER — Other Ambulatory Visit: Payer: Self-pay

## 2021-10-04 ENCOUNTER — Ambulatory Visit (INDEPENDENT_AMBULATORY_CARE_PROVIDER_SITE_OTHER): Payer: Medicare PPO

## 2021-10-04 ENCOUNTER — Ambulatory Visit: Payer: Medicare PPO | Admitting: Pulmonary Disease

## 2021-10-04 ENCOUNTER — Encounter: Payer: Self-pay | Admitting: Pulmonary Disease

## 2021-10-04 VITALS — BP 118/68 | HR 71 | Temp 98.2°F | Ht 63.0 in | Wt 164.2 lb

## 2021-10-04 DIAGNOSIS — U071 COVID-19: Secondary | ICD-10-CM | POA: Diagnosis not present

## 2021-10-04 DIAGNOSIS — R079 Chest pain, unspecified: Secondary | ICD-10-CM | POA: Diagnosis not present

## 2021-10-04 NOTE — Patient Instructions (Signed)
Nice to meet you  I am glad the methylprednisolone steroid pills have helped with your symptoms.  I think most likely this means that there is inflammation somewhere in your chest, possibly in the muscles and bones or joints-it would be most likely.  Possibly lining the lung or pleura.  The CT scan in October shows no signs of mass, blood clot, infection or other abnormality which is good.  While this is good news, it means I do not have a great answer for you in terms of what is going on.  I recommend a chest x-ray today to make sure everything still looks clear.  I also recommend to give some time and observation of your symptoms to see how it changes in the coming days to weeks after finishing the steroids.  If it stays away forever, that is great and likely nothing needs to be done.  If it comes back in the coming weeks, I would likely recommend using an inhaled steroid.  Please contact our office if symptoms recur.  If so, we will arrange follow-up at that point in time.  Otherwise, return to clinic as needed.

## 2021-10-04 NOTE — Progress Notes (Signed)
@Patient  ID: Amy Perkins, female    DOB: [birth year], 71 y.o.   MRN: [id number]  Chief Complaint  Patient presents with   Consult    Consult for pleuritic chest pain and it has been occurring sine late 2021. PCP ruled out reflux and heart issues.     Referring provider: Milus Banister, MD  HPI:   70 y.o. woman whom we are seeing in consultation for evaluation of chest discomfort/pain.  Note from referring provider, GI doctor, reviewed.  Patient reports nearly 1 year history of chest discomfort or pain.  The description of this pain is certainly atypical.  No clear pattern or reliably reproducible symptoms.  Sometimes with exertion Sometimes Not.  Sometimes Pleuritic, Sometimes Not.  Sometimes Occurs at Rest.  Sometimes Worse When She Bends over.  She Has a History of PE in 2019.  She Had a CTA PE Protocol October 2022 and on My Review Interpretation Shows No PE, No Parenchymal Abnormalities to Account for Her Discomfort, No Evidence of Serositis or Pleural Effusion.  Review of systems she does endorse some radiation of pain to her mid or lumbar back.  The back pain can also occur independently of the presence of chest pain.  She is unsure if palpation of the area makes things better or worse that she has not tried this nor have other providers when evaluating the pain.  Symptoms are not improved despite escalation in PPI therapy.  GI note reviewed, feel unlikely that current symptoms are related to the gastrointestinal tract or reflux.  She recently started a methylprednisolone taper for Planter fasciitis.  This is greatly improved her chest pain.  She denies any atopic symptoms, seasonal allergies, cough.  No history of asthma in the past.  PMH: Hypothyroid, GERD Surgical history: Tonsillectomy Family history: Father with CAD, CVA, mother with hypertension, diabetes, CVA Social history: Never smoker, lives in [place] / Pulmonary Flowsheets:   ACT:  No flowsheet  data found.  MMRC: No flowsheet data found.  Epworth:  No flowsheet data found.  Tests:   FENO:  No results found for: NITRICOXIDE  PFT: No flowsheet data found.  WALK:  No flowsheet data found.  Imaging: Personally reviewed and as per EMR discussion this note DG Foot Complete Left  Result Date: 09/29/2021 Please see detailed radiograph report in office note.   Lab Results: Personally reviewed CBC    Component Value Date/Time   WBC 5.6 09/26/2021 0948   WBC 5.7 06/02/2021 0912   RBC 4.75 09/26/2021 0948   HGB 14.3 09/26/2021 0948   HCT 42.6 09/26/2021 0948   PLT 180 09/26/2021 0948   PLT 203 02/08/2010 0000   MCV 89.7 09/26/2021 0948   MCH 30.1 09/26/2021 0948   MCHC 33.6 09/26/2021 0948   RDW 12.7 09/26/2021 0948   LYMPHSABS 1.7 09/26/2021 0948   MONOABS 0.4 09/26/2021 0948   EOSABS 0.2 09/26/2021 0948   BASOSABS 0.0 09/26/2021 0948    BMET    Component Value Date/Time   NA 138 09/26/2021 0948   K 4.0 09/26/2021 0948   CL 103 09/26/2021 0948   CO2 30 09/26/2021 0948   GLUCOSE 90 09/26/2021 0948   GLUCOSE 56 02/08/2010 0000   BUN 15 09/26/2021 0948   CREATININE 0.77 09/26/2021 0948   CALCIUM 9.5 09/26/2021 0948   GFRNONAA >60 09/26/2021 0948   GFRAA >60 09/26/2019 1101    BNP No results found for: BNP  ProBNP No results found for: PROBNP  Specialty Problems   None   No Known Allergies  Immunization History  Administered Date(s) Administered   H1N1 09/30/2008   Influenza Split 06/28/2011   Influenza Whole 05/06/2010   Influenza, High Dose Seasonal PF 05/14/2020   Influenza,inj,Quad PF,6+ Mos 07/08/2013, 07/03/2014, 06/05/2016, 04/13/2017, 04/08/2018, 04/15/2019   Influenza-Unspecified 06/22/2015, 05/27/2021   PFIZER Comirnaty(Gray Top)Covid-19 Tri-Sucrose Vaccine 01/03/2021   PFIZER(Purple Top)SARS-COV-2 Vaccination 09/27/2019, 10/22/2019, 05/24/2020   Pfizer Covid-19 Vaccine Bivalent Booster 36yrs & up 05/27/2021   Pneumococcal  Conjugate-13 04/01/2015   Pneumococcal Polysaccharide-23 04/03/2016   Td 05/22/2005   Tdap 06/28/2011   Zoster, Live 01/14/2014    Past Medical History:  Diagnosis Date   Depression    Hyperlipidemia    Migraines    Rectal bleeding 05/26/2020   Squamous cell carcinoma 09/20/2017   chest    Tobacco History: Social History   Tobacco Use  Smoking Status Never  Smokeless Tobacco Never   Counseling given: Not Answered   Continue to not smoke  Outpatient Encounter Medications as of 10/04/2021  Medication Sig   ALPRAZolam (XANAX) 0.5 MG tablet Take 0.5 mg by mouth See admin instructions. Take 1/2 tablet by mouth 2 times daily (morning and 3PM) , and 1 tablet at bedtime.   Biotin 1000 MCG CHEW Chew by mouth.   citalopram (CELEXA) 40 MG tablet Take 1 tablet (40 mg total) by mouth daily.   COVID-19 mRNA Vac-TriS, Pfizer, (PFIZER-BIONT COVID-19 VAC-TRIS) SUSP injection Inject into the muscle.   ELIQUIS 5 MG TABS tablet TAKE 1 TABLET BY MOUTH TWICE A DAY   levothyroxine (SYNTHROID) 50 MCG tablet TAKE 1 TABLET BY MOUTH EVERY DAY   omeprazole (PRILOSEC) 20 MG capsule TAKE 1 CAPSULE BY MOUTH TWICE A DAY   simvastatin (ZOCOR) 40 MG tablet TAKE 1 TABLET BY MOUTH EVERYDAY AT BEDTIME   methylPREDNISolone (MEDROL DOSEPAK) 4 MG TBPK tablet 6 day dose pack - take as directed (Patient not taking: Reported on 10/04/2021)   Facility-Administered Encounter Medications as of 10/04/2021  Medication   betamethasone acetate-betamethasone sodium phosphate (CELESTONE) injection 3 mg     Review of Systems  Review of Systems  No orthopnea or PND.  No lower extremity swelling.  Comprehensive review of systems otherwise negative. Physical Exam  BP 118/68 (BP Location: Left Arm, Patient Position: Sitting, Cuff Size: Normal)    Pulse 71    Temp 98.2 F (36.8 C) (Oral)    Ht 5\' 3"  (1.6 m)    Wt 164 lb 3.2 oz (74.5 kg)    SpO2 97%    BMI 29.09 kg/m   Wt Readings from Last 5 Encounters:  10/04/21 164 lb  3.2 oz (74.5 kg)  09/26/21 165 lb (74.8 kg)  09/09/21 168 lb (76.2 kg)  08/12/21 165 lb (74.8 kg)  06/02/21 166 lb 3.2 oz (75.4 kg)    BMI Readings from Last 5 Encounters:  10/04/21 29.09 kg/m  09/26/21 29.23 kg/m  09/09/21 29.76 kg/m  08/12/21 28.95 kg/m  06/02/21 29.16 kg/m     Physical Exam General: Well-appearing, no acute distress Eyes: EOMI, icterus Neck: Supple, no JVP Pulmonary: Clear, normal work of breathing, no chest pain with inspiration Cardiovascular: Regular rhythm, no murmur Abdomen: Nondistended, bowel sounds present MSK: Costochondral junction nontender to touch bilaterally, no synovitis, no joint effusion Neuro: Normal gait, no weakness Psych: Normal mood, full affect  Assessment & Plan:   Chest pain/discomfort: Present for many months, a year.  Atypical.  No consistent exacerbating factors.  Occasionally worse with  exertion and occasionally worse with inspiration occasionally present at rest.  CT chest 05/2021 with ongoing symptoms demonstrates no intraparenchymal cause of symptoms, no evidence of thickened pleura or pleural effusion suspect pleurisy or serositis.  Is possible this is the case but again not always pleuritic in nature.  High suspicion for costochondritis given description of symptoms.  She is on a methylprednisolone taper for her planter fasciitis and this is improved her sensation of chest pain.  No point tenderness on exam today.  Advised her to assess symptoms as she is farther away from her methylprednisolone dose.  If symptoms recur would likely offer inhaled corticosteroid.  Notably she denies any atopic symptoms history of asthma but may worth a try.  Not classic for asthma or reactive airways.  Repeat chest x-ray today to evaluate presence of pleural effusion or other abnormality that may change management.  If symptoms do not recur, no need for further follow-up.   Return in about 3 months (around 01/01/2022).   Lanier Clam,  MD 10/04/2021

## 2021-10-19 ENCOUNTER — Ambulatory Visit: Payer: Medicare PPO | Admitting: Podiatry

## 2021-10-19 ENCOUNTER — Other Ambulatory Visit: Payer: Self-pay

## 2021-10-19 DIAGNOSIS — M722 Plantar fascial fibromatosis: Secondary | ICD-10-CM | POA: Diagnosis not present

## 2021-10-19 MED ORDER — BETAMETHASONE SOD PHOS & ACET 6 (3-3) MG/ML IJ SUSP
3.0000 mg | Freq: Once | INTRAMUSCULAR | Status: AC
  Administered 2021-10-19: 3 mg via INTRA_ARTICULAR

## 2021-10-19 NOTE — Progress Notes (Signed)
? ?  Subjective: ?71 y.o. female presenting today for follow-up evaluation of plantar fasciitis to the left heel.  She says that she felt better for about 3 weeks and slowly the pain is returned.  She completed the Medrol Dosepak as instructed.  She continues wearing the [employer].  She quit wearing the plantar fascial brace because she was not sure how to apply it.  She says that now the pain is back.  She presents for further treatment and evaluation ? ?Past Medical History:  ?Diagnosis Date  ? Depression   ? Hyperlipidemia   ? Migraines   ? Rectal bleeding 05/26/2020  ? Squamous cell carcinoma 09/20/2017  ? chest  ? ?Past Surgical History:  ?Procedure Laterality Date  ? DILATION AND CURETTAGE OF UTERUS    ? x2  ? laser vein surgery    ? TONSILLECTOMY    ? ?No Known Allergies ? ? ?Objective: ?Physical Exam ?General: The patient is alert and oriented x3 in no acute distress. ? ?Dermatology: Skin is warm, dry and supple bilateral lower extremities. Negative for open lesions or macerations bilateral.  ? ?Vascular: Dorsalis Pedis and Posterior Tibial pulses palpable bilateral.  Capillary fill time is immediate to all digits. ? ?Neurological: Epicritic and protective threshold intact bilateral.  ? ?Musculoskeletal: There continues to be some tenderness to palpation to the plantar aspect of the left heel along the plantar fascia. All other joints range of motion within normal limits bilateral. Strength 5/5 in all groups bilateral.  ? ?Assessment: ?1. Plantar fasciitis left foot ? ?Plan of Care:  ?1. Patient evaluated.   ?2. Injection of 0.5cc Celestone soluspan injected into the left plantar fascia.  ?3.  Patient may discontinue the plantar fascial brace.  She has a plantar fascial compression sleeve with a built-in silicone heel.  She can continue ?4.  OTC power step insoles dispensed to apply to wear inside of her [employer] running shoes ?5.  Continue OTC Tylenol.  Patient is on anticoagulant and cannot take  NSAIDs ?6.  Return to clinic in 1 month ? ? ?Edrick Kins, DPM ?[facility] ? ?Dr. Edrick Kins, DPM  ?  ?[address]                                     ?[place], [place] [address]                ?Office [phone]  ?Fax [phone] ? ? ? ? ?

## 2021-11-01 ENCOUNTER — Telehealth: Payer: Self-pay | Admitting: Pulmonary Disease

## 2021-11-01 MED ORDER — BUDESONIDE-FORMOTEROL FUMARATE 160-4.5 MCG/ACT IN AERO: 2.0000 | INHALATION_SPRAY | Freq: Two times a day (BID) | RESPIRATORY_TRACT | 6 refills | Status: DC

## 2021-11-01 NOTE — Telephone Encounter (Signed)
Symbicort 2 puffs twice daily prescribed, please instruct her to rinse mouth with water after every use.

## 2021-11-01 NOTE — Telephone Encounter (Signed)
Patient is aware of below message/recommendations and voiced her understanding.  Nothing further needed.  

## 2021-11-01 NOTE — Telephone Encounter (Signed)
Spoke to patient.  ?She would like Rx for steroid that was discussed at last OV due to lingering sx.  ?C/o center chest aching and occ sob with exertion. Tylenol seems to help with her sx, therefore she is concerned that this related to inflammation. ?Denied f/c/s or additional sx. ? ? ?Dr. Silas Flood, please advise. Thanks  ?

## 2021-11-05 ENCOUNTER — Other Ambulatory Visit: Payer: Self-pay | Admitting: Hematology & Oncology

## 2021-11-06 ENCOUNTER — Other Ambulatory Visit: Payer: Self-pay | Admitting: Family Medicine

## 2021-11-21 ENCOUNTER — Ambulatory Visit: Payer: Medicare PPO | Admitting: Podiatry

## 2021-11-21 DIAGNOSIS — M722 Plantar fascial fibromatosis: Secondary | ICD-10-CM | POA: Diagnosis not present

## 2021-11-21 NOTE — Progress Notes (Signed)
? ?  Subjective: ?71 y.o. female presenting today for follow-up evaluation of plantar fasciitis to the left heel.  Patient states that she is doing significantly better.  She no longer has any pain or tenderness associated to the heel.  No new complaints at this time ? ?Past Medical History:  ?Diagnosis Date  ? Depression   ? Hyperlipidemia   ? Migraines   ? Rectal bleeding 05/26/2020  ? Squamous cell carcinoma 09/20/2017  ? chest  ? ?Past Surgical History:  ?Procedure Laterality Date  ? DILATION AND CURETTAGE OF UTERUS    ? x2  ? laser vein surgery    ? TONSILLECTOMY    ? ?No Known Allergies ? ? ?Objective: ?Physical Exam ?General: The patient is alert and oriented x3 in no acute distress. ? ?Dermatology: Skin is warm, dry and supple bilateral lower extremities. Negative for open lesions or macerations bilateral.  ? ?Vascular: Dorsalis Pedis and Posterior Tibial pulses palpable bilateral.  Capillary fill time is immediate to all digits. ? ?Neurological: Epicritic and protective threshold intact bilateral.  ? ?Musculoskeletal: Negative for any significant tenderness to palpation to the plantar aspect of the left heel along the plantar fascia. All other joints range of motion within normal limits bilateral. Strength 5/5 in all groups bilateral.  ? ?Assessment: ?1. Plantar fasciitis left foot ? ?Plan of Care:  ?1. Patient evaluated.   ?2.  Overall the patient is doing much better ?3.  Continue OTC power step insoles with [employer] running shoes ?4.  No NSAIDs.  Patient on anticoagulant therapy ?5.  Return to clinic as needed ? ? ?Edrick Kins, DPM ?[facility] ? ?Dr. Edrick Kins, DPM  ?  ?[address]                                     ?[place], [place] [address]                ?Office [phone]  ?Fax [phone] ? ? ? ? ?

## 2021-12-01 ENCOUNTER — Ambulatory Visit: Payer: Medicare PPO | Admitting: Family Medicine

## 2021-12-07 ENCOUNTER — Ambulatory Visit (INDEPENDENT_AMBULATORY_CARE_PROVIDER_SITE_OTHER): Payer: Medicare PPO | Admitting: Family Medicine

## 2021-12-07 ENCOUNTER — Encounter: Payer: Self-pay | Admitting: Family Medicine

## 2021-12-07 VITALS — BP 116/76 | HR 60 | Temp 97.3°F | Resp 16 | Ht 63.0 in | Wt 160.6 lb

## 2021-12-07 DIAGNOSIS — Z0000 Encounter for general adult medical examination without abnormal findings: Secondary | ICD-10-CM | POA: Diagnosis not present

## 2021-12-07 DIAGNOSIS — E785 Hyperlipidemia, unspecified: Secondary | ICD-10-CM

## 2021-12-07 LAB — CBC WITH DIFFERENTIAL/PLATELET
Basophils Absolute: 0.1 10*3/uL (ref 0.0–0.1)
Basophils Relative: 0.8 % (ref 0.0–3.0)
Eosinophils Absolute: 0.2 10*3/uL (ref 0.0–0.7)
Eosinophils Relative: 3.5 % (ref 0.0–5.0)
HCT: 42.6 % (ref 36.0–46.0)
Hemoglobin: 14.7 g/dL (ref 12.0–15.0)
Lymphocytes Relative: 30.6 % (ref 12.0–46.0)
Lymphs Abs: 2 10*3/uL (ref 0.7–4.0)
MCHC: 34.4 g/dL (ref 30.0–36.0)
MCV: 89.8 fl (ref 78.0–100.0)
Monocytes Absolute: 0.5 10*3/uL (ref 0.1–1.0)
Monocytes Relative: 7.1 % (ref 3.0–12.0)
Neutro Abs: 3.8 10*3/uL (ref 1.4–7.7)
Neutrophils Relative %: 58 % (ref 43.0–77.0)
Platelets: 197 10*3/uL (ref 150.0–400.0)
RBC: 4.75 Mil/uL (ref 3.87–5.11)
RDW: 13.3 % (ref 11.5–15.5)
WBC: 6.6 10*3/uL (ref 4.0–10.5)

## 2021-12-07 LAB — HEPATIC FUNCTION PANEL
ALT: 33 U/L (ref 0–35)
AST: 19 U/L (ref 0–37)
Albumin: 4.4 g/dL (ref 3.5–5.2)
Alkaline Phosphatase: 57 U/L (ref 39–117)
Bilirubin, Direct: 0.1 mg/dL (ref 0.0–0.3)
Total Bilirubin: 0.5 mg/dL (ref 0.2–1.2)
Total Protein: 6.3 g/dL (ref 6.0–8.3)

## 2021-12-07 LAB — LIPID PANEL
Cholesterol: 195 mg/dL (ref 0–200)
HDL: 54.5 mg/dL (ref >39.00)
LDL Cholesterol: 116 mg/dL — ABNORMAL HIGH (ref 0–99)
NonHDL: 140.77
Total CHOL/HDL Ratio: 4
Triglycerides: 125 mg/dL (ref 0.0–149.0)
VLDL: 25 mg/dL (ref 0.0–40.0)

## 2021-12-07 LAB — BASIC METABOLIC PANEL
BUN: 15 mg/dL (ref 6–23)
CO2: 30 mEq/L (ref 19–32)
Calcium: 9.5 mg/dL (ref 8.4–10.5)
Chloride: 102 mEq/L (ref 96–112)
Creatinine, Ser: 0.72 mg/dL (ref 0.40–1.20)
GFR: 83.89 mL/min (ref >60.00)
Glucose, Bld: 108 mg/dL — ABNORMAL HIGH (ref 70–99)
Potassium: 4.1 mEq/L (ref 3.5–5.1)
Sodium: 140 mEq/L (ref 135–145)

## 2021-12-07 LAB — TSH: TSH: 2.71 u[IU]/mL (ref 0.35–5.50)

## 2021-12-07 NOTE — Assessment & Plan Note (Signed)
Chronic problem.  Tolerating statin w/o difficulty.  Check labs.  Adjust meds prn  

## 2021-12-07 NOTE — Assessment & Plan Note (Signed)
Pt's PE WNL w/ exception of hyperpigmented mole on L upper arm.  Pt will call derm to see if they want her to come in before October.  UTD on pap, mammo, colonoscopy, PNA vaccines.  Encouraged her to get shingrix and Tdap at the pharmacy.  Check labs.  Anticipatory guidance provided.  ?

## 2021-12-07 NOTE — Patient Instructions (Addendum)
Follow up in 6 months to recheck cholesterol and thyroid ?We'll notify you of your lab results and make any changes if needed ?Keep up the good work on healthy diet and regular exercise- you look great! ?Get the shingles shot and Tdap at the pharmacy at your convenience ?Call and check with the Dermatologist about your arm ?Call with any questions or concerns ?Stay Safe!  Stay Healthy! ?Happy Spring!!! ?

## 2021-12-07 NOTE — Progress Notes (Signed)
? ?  Subjective:  ? ? Patient ID: Amy Perkins, female    DOB: [birth year], 71 y.o.   MRN: [id number] ? ?HPI ?CPE- UTD on pap, mammo, colonoscopy.  UTD on PNA vaccines ? ?Patient Care Team  ?  Relationship Specialty Notifications Start End  ?Midge Minium, MD PCP - General   08/03/10   ?[name], MD Consulting Physician Gastroenterology  10/04/15   ?Marcial Pacas, MD Consulting Physician Neurology  10/04/15   ?Dian Queen, MD Consulting Physician Obstetrics and Gynecology  10/04/15   ?[facility]    10/10/17   ?Luberta Mutter, MD Consulting Physician Ophthalmology  10/10/17   ?Earley Favor  Dentistry  10/10/17   ?Cindie Crumbly  Dermatology  10/10/17   ?Tish Men, MD (Inactive) Consulting Physician Hematology  10/16/18   ?Milly Jakob, MD Consulting Physician Orthopedic Surgery  10/16/18   ?  ?Health Maintenance  ?Topic Date Due  ? Zoster Vaccines- Shingrix (1 of 2) Never done  ? MAMMOGRAM  03/04/2021  ? TETANUS/TDAP  06/27/2021  ? INFLUENZA VACCINE  03/21/2022  ? COLONOSCOPY (Pts 45-27yr Insurance coverage will need to be confirmed)  08/04/2027  ? Pneumonia Vaccine 72 Years old  Completed  ? DEXA SCAN  Completed  ? COVID-19 Vaccine  Completed  ? Hepatitis C Screening  Completed  ? HPV VACCINES  Aged Out  ?  ? ? ?Review of Systems ?Patient reports no vision/ hearing changes, adenopathy,fever, weight change,  persistant/recurrent hoarseness , swallowing issues, chest pain, palpitations, edema, persistant/recurrent cough, hemoptysis, gastrointestinal bleeding (melena, rectal bleeding), abdominal pain, significant heartburn, bowel changes, GU symptoms (dysuria, hematuria, incontinence), Gyn symptoms (abnormal  bleeding, pain),  syncope, focal weakness, memory loss, numbness & tingling, skin/hair/nail changes, abnormal bruising or bleeding, anxiety, or depression.  ? ?+ occasional SOB- following w/ Pulm. ? ?   ?Objective:  ? Physical Exam ?General Appearance:    Alert, cooperative, no distress, appears  stated age  ?Head:    Normocephalic, without obvious abnormality, atraumatic  ?Eyes:    PERRL, conjunctiva/corneas clear, EOM's intact both eyes  ?Ears:    Normal TM's and external ear canals, both ears  ?Nose:   Nares normal, septum midline, mucosa normal, no drainage  ?  or sinus tenderness  ?Throat:   Lips, mucosa, and tongue normal; teeth and gums normal  ?Neck:   Supple, symmetrical, trachea midline, no adenopathy;  ?  Thyroid: no enlargement/tenderness/nodules  ?Back:     Symmetric, no curvature, ROM normal, no CVA tenderness  ?Lungs:     Clear to auscultation bilaterally, respirations unlabored  ?Chest Wall:    No tenderness or deformity  ? Heart:    Regular rate and rhythm, S1 and S2 normal, no murmur, rub ?  or gallop  ?Breast Exam:    Deferred to GYN  ?Abdomen:     Soft, non-tender, bowel sounds active all four quadrants,  ?  no masses, no organomegaly  ?Genitalia:    Deferred to GYN  ?Rectal:    ?Extremities:   Extremities normal, atraumatic, no cyanosis or edema  ?Pulses:   2+ and symmetric all extremities  ?Skin:   Skin color, texture, turgor normal, no rashes.  + hyperpigmented nevus on L upper arm  ?Lymph nodes:   Cervical, supraclavicular, and axillary nodes normal  ?Neurologic:   CNII-XII intact, normal strength, sensation and reflexes  ?  throughout  ?  ? ? ? ?   ?Assessment & Plan:  ? ? ?

## 2021-12-09 ENCOUNTER — Telehealth: Payer: Self-pay

## 2021-12-09 NOTE — Telephone Encounter (Signed)
Patient aware of labs.  

## 2021-12-09 NOTE — Telephone Encounter (Signed)
-----   Message from Midge Minium, MD sent at 12/08/2021  7:43 AM EDT ----- ?Labs look good!  No changes at this time ?

## 2021-12-12 DIAGNOSIS — F411 Generalized anxiety disorder: Secondary | ICD-10-CM | POA: Diagnosis not present

## 2021-12-12 DIAGNOSIS — F3342 Major depressive disorder, recurrent, in full remission: Secondary | ICD-10-CM | POA: Diagnosis not present

## 2021-12-20 ENCOUNTER — Encounter: Payer: Self-pay | Admitting: Family Medicine

## 2021-12-28 ENCOUNTER — Encounter: Payer: Self-pay | Admitting: Family Medicine

## 2022-01-03 ENCOUNTER — Other Ambulatory Visit: Payer: Self-pay | Admitting: Hematology & Oncology

## 2022-02-02 ENCOUNTER — Ambulatory Visit: Payer: Medicare PPO

## 2022-02-02 ENCOUNTER — Ambulatory Visit (INDEPENDENT_AMBULATORY_CARE_PROVIDER_SITE_OTHER): Payer: Medicare PPO

## 2022-02-02 DIAGNOSIS — Z0000 Encounter for general adult medical examination without abnormal findings: Secondary | ICD-10-CM

## 2022-02-02 NOTE — Progress Notes (Signed)
Subjective:   Amy Perkins is a 71 y.o. female who presents for Medicare Annual (Subsequent) preventive examination.    I connected with Rainie Crenshaw  today by telephone and verified that I am speaking with the correct person using two identifiers. Location patient: home Location provider: work Persons participating in the virtual visit: patient, provider.   I discussed the limitations, risks, security and privacy concerns of performing an evaluation and management service by telephone and the availability of in person appointments. I also discussed with the patient that there may be a patient responsible charge related to this service. The patient expressed understanding and verbally consented to this telephonic visit.    Interactive audio and video telecommunications were attempted between this provider and patient, however failed, due to patient having technical difficulties OR patient did not have access to video capability.  We continued and completed visit with audio only.    Review of Systems     Cardiac Risk Factors include: advanced age (>32mn, >>38women)     Objective:    Today's Vitals   There is no height or weight on file to calculate BMI.     02/02/2022    8:46 AM 09/26/2021   10:10 AM 11/29/2020    9:24 AM 09/24/2020   10:24 AM 11/26/2019    9:17 AM 12/27/2018   10:54 AM 10/16/2018    9:23 AM  Advanced Directives  Does Patient Have a Medical Advance Directive? Yes No Yes Yes Yes Yes Yes  Type of [place]  [place] Living will;Healthcare Power of Attorney Living will;Healthcare Power of [place] Living will;Healthcare Power of Attorney  Does patient want to make changes to medical advance directive?    No - Patient declined No - Patient declined No - Patient declined   Copy of [place] Chart? No - copy requested  Yes -  validated most recent copy scanned in chart (See row information)  Yes - validated most recent copy scanned in chart (See row information) No - copy requested No - copy requested  Would patient like information on creating a medical advance directive?  No - Patient declined         Current Medications (verified) Outpatient Encounter Medications as of 02/02/2022  Medication Sig   ALPRAZolam (XANAX) 0.5 MG tablet Take 0.5 mg by mouth See admin instructions. Take 1/2 tablet by mouth 2 times daily (morning and 3PM) , and 1 tablet at bedtime.   Biotin 1000 MCG CHEW Chew by mouth.   budesonide-formoterol (SYMBICORT) 160-4.5 MCG/ACT inhaler Inhale 2 puffs into the lungs 2 (two) times daily.   citalopram (CELEXA) 40 MG tablet Take 1 tablet (40 mg total) by mouth daily.   ELIQUIS 5 MG TABS tablet TAKE 1 TABLET BY MOUTH TWICE A DAY   levothyroxine (SYNTHROID) 50 MCG tablet TAKE 1 TABLET BY MOUTH EVERY DAY   omeprazole (PRILOSEC) 20 MG capsule TAKE 1 CAPSULE BY MOUTH TWICE A DAY   simvastatin (ZOCOR) 40 MG tablet TAKE 1 TABLET BY MOUTH EVERYDAY AT BEDTIME   Facility-Administered Encounter Medications as of 02/02/2022  Medication   betamethasone acetate-betamethasone sodium phosphate (CELESTONE) injection 3 mg    Allergies (verified) Patient has no known allergies.   History: Past Medical History:  Diagnosis Date   Depression    Hyperlipidemia    Migraines    Rectal bleeding 05/26/2020   Squamous cell carcinoma  09/20/2017   chest   Past Surgical History:  Procedure Laterality Date   DILATION AND CURETTAGE OF UTERUS     x2   laser vein surgery     TONSILLECTOMY     Family History  Problem Relation Age of Onset   Coronary artery disease Father    Stroke Father    Parkinson's disease Father    Hypertension Mother    Diabetes Mother    Stroke Mother    Coronary artery disease Brother    Factor V Leiden deficiency Daughter    Heart disease Paternal Grandfather    Colon cancer Neg Hx     Esophageal cancer Neg Hx    Pancreatic cancer Neg Hx    Stomach cancer Neg Hx    Rectal cancer Neg Hx    Social History   Socioeconomic History   Marital status: Divorced    Spouse name: Not on file   Number of children: 2   Years of education: college   Highest education level: Not on file  Occupational History   Occupation: Product manager: [employer]    Comment: [employer]  Tobacco Use   Smoking status: Never   Smokeless tobacco: Never  Vaping Use   Vaping Use: Never used  Substance and Sexual Activity   Alcohol use: Yes    Alcohol/week: 4.0 standard drinks of alcohol    Types: 4 Glasses of wine per week    Comment: 4 glass week   Drug use: No   Sexual activity: Never  Other Topics Concern   Not on file  Social History Narrative   Divorced, both children live locally.   Patient works part time at [employer]. College education.   Left handed.   Caffeine- two cups coffee daily.   Social Determinants of Health   Financial Resource Strain: Low Risk  (02/02/2022)   Overall Financial Resource Strain (CARDIA)    Difficulty of Paying Living Expenses: Not hard at all  Food Insecurity: No Food Insecurity (02/02/2022)   Hunger Vital Sign    Worried About Running Out of Food in the Last Year: Never true    Ran Out of Food in the Last Year: Never true  Transportation Needs: No Transportation Needs (02/02/2022)   PRAPARE - Hydrologist (Medical): No    Lack of Transportation (Non-Medical): No  Physical Activity: Sufficiently Active (02/02/2022)   Exercise Vital Sign    Days of Exercise per Week: 5 days    Minutes of Exercise per Session: 60 min  Stress: No Stress Concern Present (02/02/2022)   [place]    Feeling of Stress : Not at all  Social Connections: Moderately Integrated (02/02/2022)   Social Connection and Isolation Panel [NHANES]    Frequency of Communication  with Friends and Family: Three times a week    Frequency of Social Gatherings with Friends and Family: Three times a week    Attends Religious Services: More than 4 times per year    Active Member of Clubs or Organizations: Yes    Attends Music therapist: More than 4 times per year    Marital Status: Divorced    Tobacco Counseling Counseling given: Not Answered   Clinical Intake:  Pre-visit preparation completed: Yes  Pain : No/denies pain     Nutritional Risks: None Diabetes: No  How often do you need to have someone help you when you read instructions,  pamphlets, or other written materials from your doctor or pharmacy?: 1 - Never What is the last grade level you completed in school?: masters  Diabetic?no   Interpreter Needed?: No  Information entered by :: L.Nylah Butkus,LPN   Activities of Daily Living    02/02/2022    8:50 AM 06/02/2021    8:28 AM  In your present state of health, do you have any difficulty performing the following activities:  Hearing? 0 0  Vision? 0 0  Difficulty concentrating or making decisions? 0 0  Walking or climbing stairs? 0 0  Dressing or bathing? 0 0  Doing errands, shopping? 0 0  Preparing Food and eating ? N   Using the Toilet? N   In the past six months, have you accidently leaked urine? N   Do you have problems with loss of bowel control? N   Managing your Medications? N   Managing your Finances? N   Housekeeping or managing your Housekeeping? N     Patient Care Team: Midge Minium, MD as PCP - General [name], MD as Consulting Physician (Gastroenterology) Marcial Pacas, MD as Consulting Physician (Neurology) Dian Queen, MD as Consulting Physician (Obstetrics and Gynecology) [facility] Luberta Mutter, MD as Consulting Physician (Ophthalmology) Earley Favor (Dentistry) Cindie Crumbly (Dermatology) Tish Men, MD (Inactive) as Consulting Physician (Hematology) Milly Jakob, MD as  Consulting Physician (Orthopedic Surgery)  Indicate any recent Medical Services you may have received from other than [facility] providers in the past year (date may be approximate).     Assessment:   This is a routine wellness examination for Amy Perkins.  Hearing/Vision screen Vision Screening - Comments:: Annual eye exams wear contacts Purnell Shoemaker   Dietary issues and exercise activities discussed: Current Exercise Habits: Home exercise routine, Type of exercise: walking, Time (Minutes): 60, Frequency (Times/Week): 5, Weekly Exercise (Minutes/Week): 300, Intensity: Mild, Exercise limited by: None identified   Goals Addressed   None    Depression Screen    02/02/2022    8:47 AM 02/02/2022    8:45 AM 12/07/2021    7:55 AM 06/02/2021    8:27 AM 11/29/2020    9:26 AM 11/29/2020    8:20 AM 05/25/2020    9:33 AM  PHQ 2/9 Scores  PHQ - 2 Score 0 0 0 0 0 0 0  PHQ- 9 Score   2 0  0 0    Fall Risk    02/02/2022    8:47 AM 12/07/2021    7:55 AM 06/02/2021    8:27 AM 11/29/2020    9:26 AM 11/29/2020    8:20 AM  Fall Risk   Falls in the past year? 0 1 0 0 0  Number falls in past yr: 0 1 0 0 0  Injury with Fall? 0 0 0 0 0  Risk for fall due to :  History of fall(s) No Fall Risks  No Fall Risks  Follow up Falls evaluation completed;Education provided Falls evaluation completed  Falls prevention discussed     FALL RISK PREVENTION PERTAINING TO THE HOME:  Any stairs in or around the home? No  If so, are there any without handrails? No  Home free of loose throw rugs in walkways, pet beds, electrical cords, etc? Yes  Adequate lighting in your home to reduce risk of falls? Yes   ASSISTIVE DEVICES UTILIZED TO PREVENT FALLS:  Life alert? No  Use of a cane, walker or w/c? No  Grab bars in the bathroom? No  Shower chair  or bench in shower? Yes  Elevated toilet seat or a handicapped toilet? No     Cognitive Function:Normal cognitive status assessed by telephone conversation  by this Nurse Health  Advisor. No abnormalities found.      10/16/2018    9:26 AM 10/10/2017   10:26 AM  MMSE - Mini Mental State Exam  Orientation to time 5 5  Orientation to Place 5 5  Registration 3 3  Attention/ Calculation 5 5  Recall 3 3  Language- name 2 objects 2 2  Language- repeat 1 1  Language- follow 3 step command 3 3  Language- read & follow direction 1 1  Write a sentence 1 1  Copy design 1 1  Total score 30 30        11/26/2019    9:19 AM  6CIT Screen  What Year? 0 points  What month? 0 points  What time? 0 points  Count back from 20 0 points  Months in reverse 0 points  Repeat phrase 0 points  Total Score 0 points    Immunizations Immunization History  Administered Date(s) Administered   H1N1 09/30/2008   Influenza Split 06/28/2011   Influenza Whole 05/06/2010   Influenza, High Dose Seasonal PF 05/14/2020   Influenza,inj,Quad PF,6+ Mos 07/08/2013, 07/03/2014, 06/05/2016, 04/13/2017, 04/08/2018, 04/15/2019   Influenza-Unspecified 06/22/2015, 05/27/2021   PFIZER Comirnaty(Gray Top)Covid-19 Tri-Sucrose Vaccine 01/03/2021   PFIZER(Purple Top)SARS-COV-2 Vaccination 09/27/2019, 10/22/2019, 05/24/2020   Pfizer Covid-19 Vaccine Bivalent Booster 93yr & up 05/27/2021   Pneumococcal Conjugate-13 04/01/2015   Pneumococcal Polysaccharide-23 04/03/2016   Td 05/22/2005   Tdap 06/28/2011   Zoster, Live 01/14/2014    TDAP status: Due, Education has been provided regarding the importance of this vaccine. Advised may receive this vaccine at local pharmacy or Health Dept. Aware to provide a copy of the vaccination record if obtained from local pharmacy or Health Dept. Verbalized acceptance and understanding.  Flu Vaccine status: Up to date  Pneumococcal vaccine status: Up to date  Covid-19 vaccine status: Completed vaccines  Qualifies for Shingles Vaccine? Yes   Zostavax completed No   Shingrix Completed?: No.    Education has been provided regarding the importance of this vaccine.  Patient has been advised to call insurance company to determine out of pocket expense if they have not yet received this vaccine. Advised may also receive vaccine at local pharmacy or Health Dept. Verbalized acceptance and understanding.  Screening Tests Health Maintenance  Topic Date Due   Zoster Vaccines- Shingrix (1 of 2) Never done   TETANUS/TDAP  06/27/2021   COVID-19 Vaccine (6 - Pfizer series) 09/27/2021   INFLUENZA VACCINE  03/21/2022   MAMMOGRAM  05/16/2022   COLONOSCOPY (Pts 45-474yrInsurance coverage will need to be confirmed)  08/04/2027   Pneumonia Vaccine 6569Years old  Completed   DEXA SCAN  Completed   Hepatitis C Screening  Completed   HPV VACCINES  Aged Out    Health Maintenance  Health Maintenance Due  Topic Date Due   Zoster Vaccines- Shingrix (1 of 2) Never done   TETANUS/TDAP  06/27/2021   COVID-19 Vaccine (6 - Pfizer series) 09/27/2021    Colorectal cancer screening: Type of screening: Colonoscopy. Completed 12/141/2021. Repeat every 7 years  Mammogram status: Completed 05/16/2021. Repeat every year  Bone Density status: Completed 03/05/2019. Results reflect: Bone density results: OSTEOPENIA. Repeat every 5 years.  Lung Cancer Screening: (Low Dose CT Chest recommended if Age 72-80ears, 30 pack-year currently smoking OR have quit w/in  15years.) does not qualify.   Lung Cancer Screening Referral: n/a  Additional Screening:  Hepatitis C Screening: does not qualify; Completed 10/16/2018  Vision Screening: Recommended annual ophthalmology exams for early detection of glaucoma and other disorders of the eye. Is the patient up to date with their annual eye exam?  Yes  Who is the provider or what is the name of the office in which the patient attends annual eye exams? Dr.McCuen  If pt is not established with a provider, would they like to be referred to a provider to establish care? No .   Dental Screening: Recommended annual dental exams for proper oral  hygiene  Community Resource Referral / Chronic Care Management: CRR required this visit?  No   CCM required this visit?  No      Plan:     I have personally reviewed and noted the following in the patient's chart:   Medical and social history Use of alcohol, tobacco or illicit drugs  Current medications and supplements including opioid prescriptions.  Functional ability and status Nutritional status Physical activity Advanced directives List of other physicians Hospitalizations, surgeries, and ER visits in previous 12 months Vitals Screenings to include cognitive, depression, and falls Referrals and appointments  In addition, I have reviewed and discussed with patient certain preventive protocols, quality metrics, and best practice recommendations. A written personalized care plan for preventive services as well as general preventive health recommendations were provided to patient.     Randel Pigg, LPN   5/90/9311   Nurse Notes: none

## 2022-02-02 NOTE — Patient Instructions (Signed)
Amy Perkins , Thank you for taking time to come for your Medicare Wellness Visit. I appreciate your ongoing commitment to your health goals. Please review the following plan we discussed and let me know if I can assist you in the future.   Screening recommendations/referrals: Colonoscopy: 08/03/2020 Mammogram: 05/16/2021 Bone Density: 03/05/2019 Recommended yearly ophthalmology/optometry visit for glaucoma screening and checkup Recommended yearly dental visit for hygiene and checkup  Vaccinations: Influenza vaccine: completed  Pneumococcal vaccine: completed  Tdap vaccine: due  Shingles vaccine: will consider     Advanced directives: yes   Conditions/risks identified: none   Next appointment: none    Preventive Care 23 Years and Older, Female Preventive care refers to lifestyle choices and visits with your health care provider that can promote health and wellness. What does preventive care include? A yearly physical exam. This is also called an annual well check. Dental exams once or twice a year. Routine eye exams. Ask your health care provider how often you should have your eyes checked. Personal lifestyle choices, including: Daily care of your teeth and gums. Regular physical activity. Eating a healthy diet. Avoiding tobacco and drug use. Limiting alcohol use. Practicing safe sex. Taking low-dose aspirin every day. Taking vitamin and mineral supplements as recommended by your health care provider. What happens during an annual well check? The services and screenings done by your health care provider during your annual well check will depend on your age, overall health, lifestyle risk factors, and family history of disease. Counseling  Your health care provider may ask you questions about your: Alcohol use. Tobacco use. Drug use. Emotional well-being. Home and relationship well-being. Sexual activity. Eating habits. History of falls. Memory and ability to  understand (cognition). Work and work Statistician. Reproductive health. Screening  You may have the following tests or measurements: Height, weight, and BMI. Blood pressure. Lipid and cholesterol levels. These may be checked every 5 years, or more frequently if you are over 48 years old. Skin check. Lung cancer screening. You may have this screening every year starting at age 42 if you have a 30-pack-year history of smoking and currently smoke or have quit within the past 15 years. Fecal occult blood test (FOBT) of the stool. You may have this test every year starting at age 70. Flexible sigmoidoscopy or colonoscopy. You may have a sigmoidoscopy every 5 years or a colonoscopy every 10 years starting at age 28. Hepatitis C blood test. Hepatitis B blood test. Sexually transmitted disease (STD) testing. Diabetes screening. This is done by checking your blood sugar (glucose) after you have not eaten for a while (fasting). You may have this done every 1-3 years. Bone density scan. This is done to screen for osteoporosis. You may have this done starting at age 8. Mammogram. This may be done every 1-2 years. Talk to your health care provider about how often you should have regular mammograms. Talk with your health care provider about your test results, treatment options, and if necessary, the need for more tests. Vaccines  Your health care provider may recommend certain vaccines, such as: Influenza vaccine. This is recommended every year. Tetanus, diphtheria, and acellular pertussis (Tdap, Td) vaccine. You may need a Td booster every 10 years. Zoster vaccine. You may need this after age 22. Pneumococcal 13-valent conjugate (PCV13) vaccine. One dose is recommended after age 83. Pneumococcal polysaccharide (PPSV23) vaccine. One dose is recommended after age 32. Talk to your health care provider about which screenings and vaccines you need and how  often you need them. This information is not  intended to replace advice given to you by your health care provider. Make sure you discuss any questions you have with your health care provider. Document Released: 09/03/2015 Document Revised: 04/26/2016 Document Reviewed: 06/08/2015 Elsevier Interactive Patient Education  2017 [place] Prevention in the Home Falls can cause injuries. They can happen to people of all ages. There are many things you can do to make your home safe and to help prevent falls. What can I do on the outside of my home? Regularly fix the edges of walkways and driveways and fix any cracks. Remove anything that might make you trip as you walk through a door, such as a raised step or threshold. Trim any bushes or trees on the path to your home. Use bright outdoor lighting. Clear any walking paths of anything that might make someone trip, such as rocks or tools. Regularly check to see if handrails are loose or broken. Make sure that both sides of any steps have handrails. Any raised decks and porches should have guardrails on the edges. Have any leaves, snow, or ice cleared regularly. Use sand or salt on walking paths during winter. Clean up any spills in your garage right away. This includes oil or grease spills. What can I do in the bathroom? Use night lights. Install grab bars by the toilet and in the tub and shower. Do not use towel bars as grab bars. Use non-skid mats or decals in the tub or shower. If you need to sit down in the shower, use a plastic, non-slip stool. Keep the floor dry. Clean up any water that spills on the floor as soon as it happens. Remove soap buildup in the tub or shower regularly. Attach bath mats securely with double-sided non-slip rug tape. Do not have throw rugs and other things on the floor that can make you trip. What can I do in the bedroom? Use night lights. Make sure that you have a light by your bed that is easy to reach. Do not use any sheets or blankets that are  too big for your bed. They should not hang down onto the floor. Have a firm chair that has side arms. You can use this for support while you get dressed. Do not have throw rugs and other things on the floor that can make you trip. What can I do in the kitchen? Clean up any spills right away. Avoid walking on wet floors. Keep items that you use a lot in easy-to-reach places. If you need to reach something above you, use a strong step stool that has a grab bar. Keep electrical cords out of the way. Do not use floor polish or wax that makes floors slippery. If you must use wax, use non-skid floor wax. Do not have throw rugs and other things on the floor that can make you trip. What can I do with my stairs? Do not leave any items on the stairs. Make sure that there are handrails on both sides of the stairs and use them. Fix handrails that are broken or loose. Make sure that handrails are as long as the stairways. Check any carpeting to make sure that it is firmly attached to the stairs. Fix any carpet that is loose or worn. Avoid having throw rugs at the top or bottom of the stairs. If you do have throw rugs, attach them to the floor with carpet tape. Make sure that you have a  light switch at the top of the stairs and the bottom of the stairs. If you do not have them, ask someone to add them for you. What else can I do to help prevent falls? Wear shoes that: Do not have high heels. Have rubber bottoms. Are comfortable and fit you well. Are closed at the toe. Do not wear sandals. If you use a stepladder: Make sure that it is fully opened. Do not climb a closed stepladder. Make sure that both sides of the stepladder are locked into place. Ask someone to hold it for you, if possible. Clearly mark and make sure that you can see: Any grab bars or handrails. First and last steps. Where the edge of each step is. Use tools that help you move around (mobility aids) if they are needed. These  include: Canes. Walkers. Scooters. Crutches. Turn on the lights when you go into a dark area. Replace any light bulbs as soon as they burn out. Set up your furniture so you have a clear path. Avoid moving your furniture around. If any of your floors are uneven, fix them. If there are any pets around you, be aware of where they are. Review your medicines with your doctor. Some medicines can make you feel dizzy. This can increase your chance of falling. Ask your doctor what other things that you can do to help prevent falls. This information is not intended to replace advice given to you by your health care provider. Make sure you discuss any questions you have with your health care provider. Document Released: 06/03/2009 Document Revised: 01/13/2016 Document Reviewed: 09/11/2014 Elsevier Interactive Patient Education  2017 [employer].

## 2022-02-27 DIAGNOSIS — D485 Neoplasm of uncertain behavior of skin: Secondary | ICD-10-CM | POA: Diagnosis not present

## 2022-02-27 DIAGNOSIS — D492 Neoplasm of unspecified behavior of bone, soft tissue, and skin: Secondary | ICD-10-CM | POA: Diagnosis not present

## 2022-02-27 DIAGNOSIS — L989 Disorder of the skin and subcutaneous tissue, unspecified: Secondary | ICD-10-CM | POA: Diagnosis not present

## 2022-03-04 ENCOUNTER — Other Ambulatory Visit: Payer: Self-pay | Admitting: Hematology & Oncology

## 2022-03-06 DIAGNOSIS — H5213 Myopia, bilateral: Secondary | ICD-10-CM | POA: Diagnosis not present

## 2022-03-06 DIAGNOSIS — D23112 Other benign neoplasm of skin of right lower eyelid, including canthus: Secondary | ICD-10-CM | POA: Diagnosis not present

## 2022-03-06 DIAGNOSIS — H2513 Age-related nuclear cataract, bilateral: Secondary | ICD-10-CM | POA: Diagnosis not present

## 2022-03-31 DIAGNOSIS — L905 Scar conditions and fibrosis of skin: Secondary | ICD-10-CM | POA: Diagnosis not present

## 2022-03-31 DIAGNOSIS — D0362 Melanoma in situ of left upper limb, including shoulder: Secondary | ICD-10-CM | POA: Diagnosis not present

## 2022-04-10 ENCOUNTER — Other Ambulatory Visit: Payer: Self-pay | Admitting: Family Medicine

## 2022-04-14 IMAGING — DX DG CHEST 2V
2 series · 2 of 2 positions shown · non-contrast
Comparison: 07/18/2018

CLINICAL DATA: 70-year-old female with a history of chest pain

EXAM:
CHEST - 2 VIEW

[chest pa]
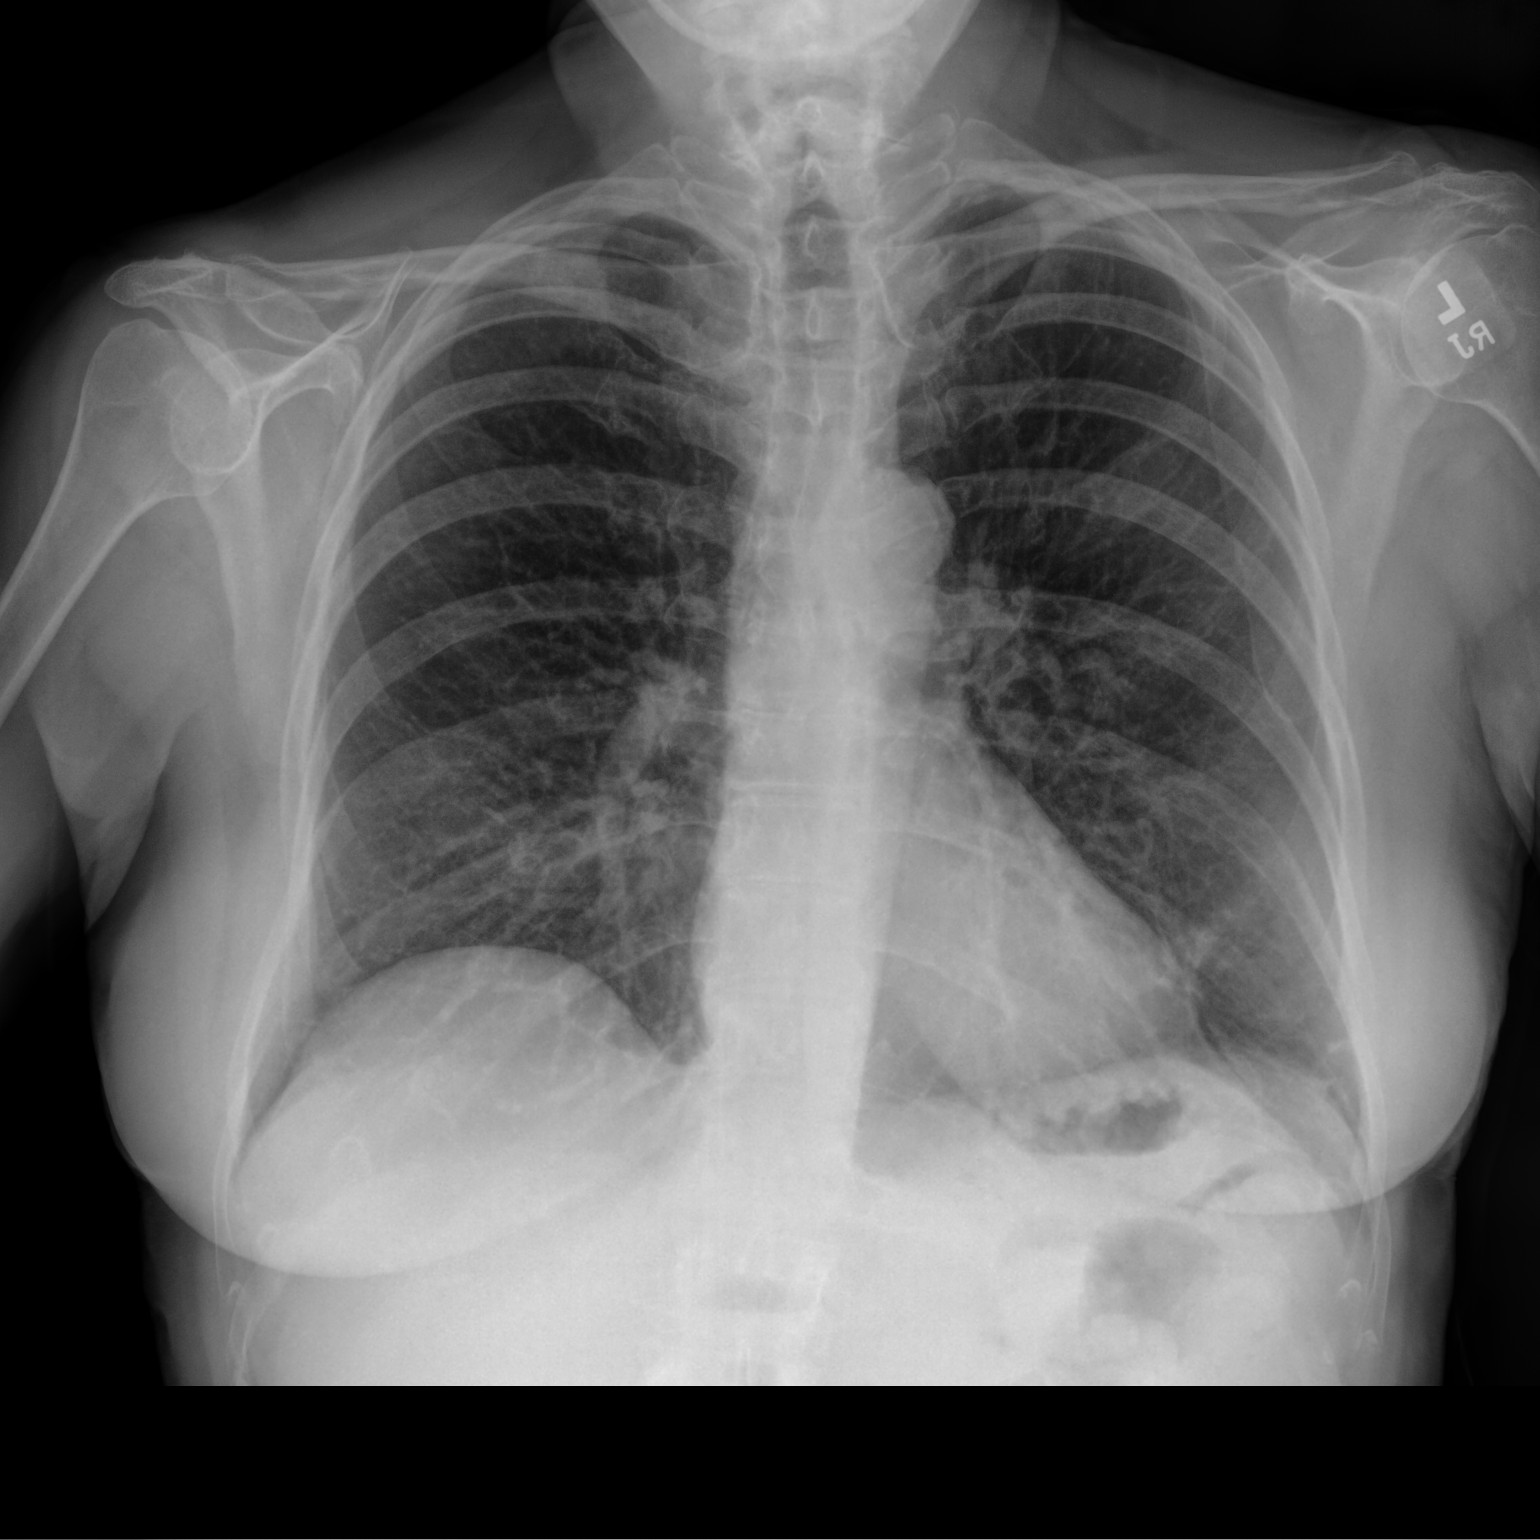

[chest lat]
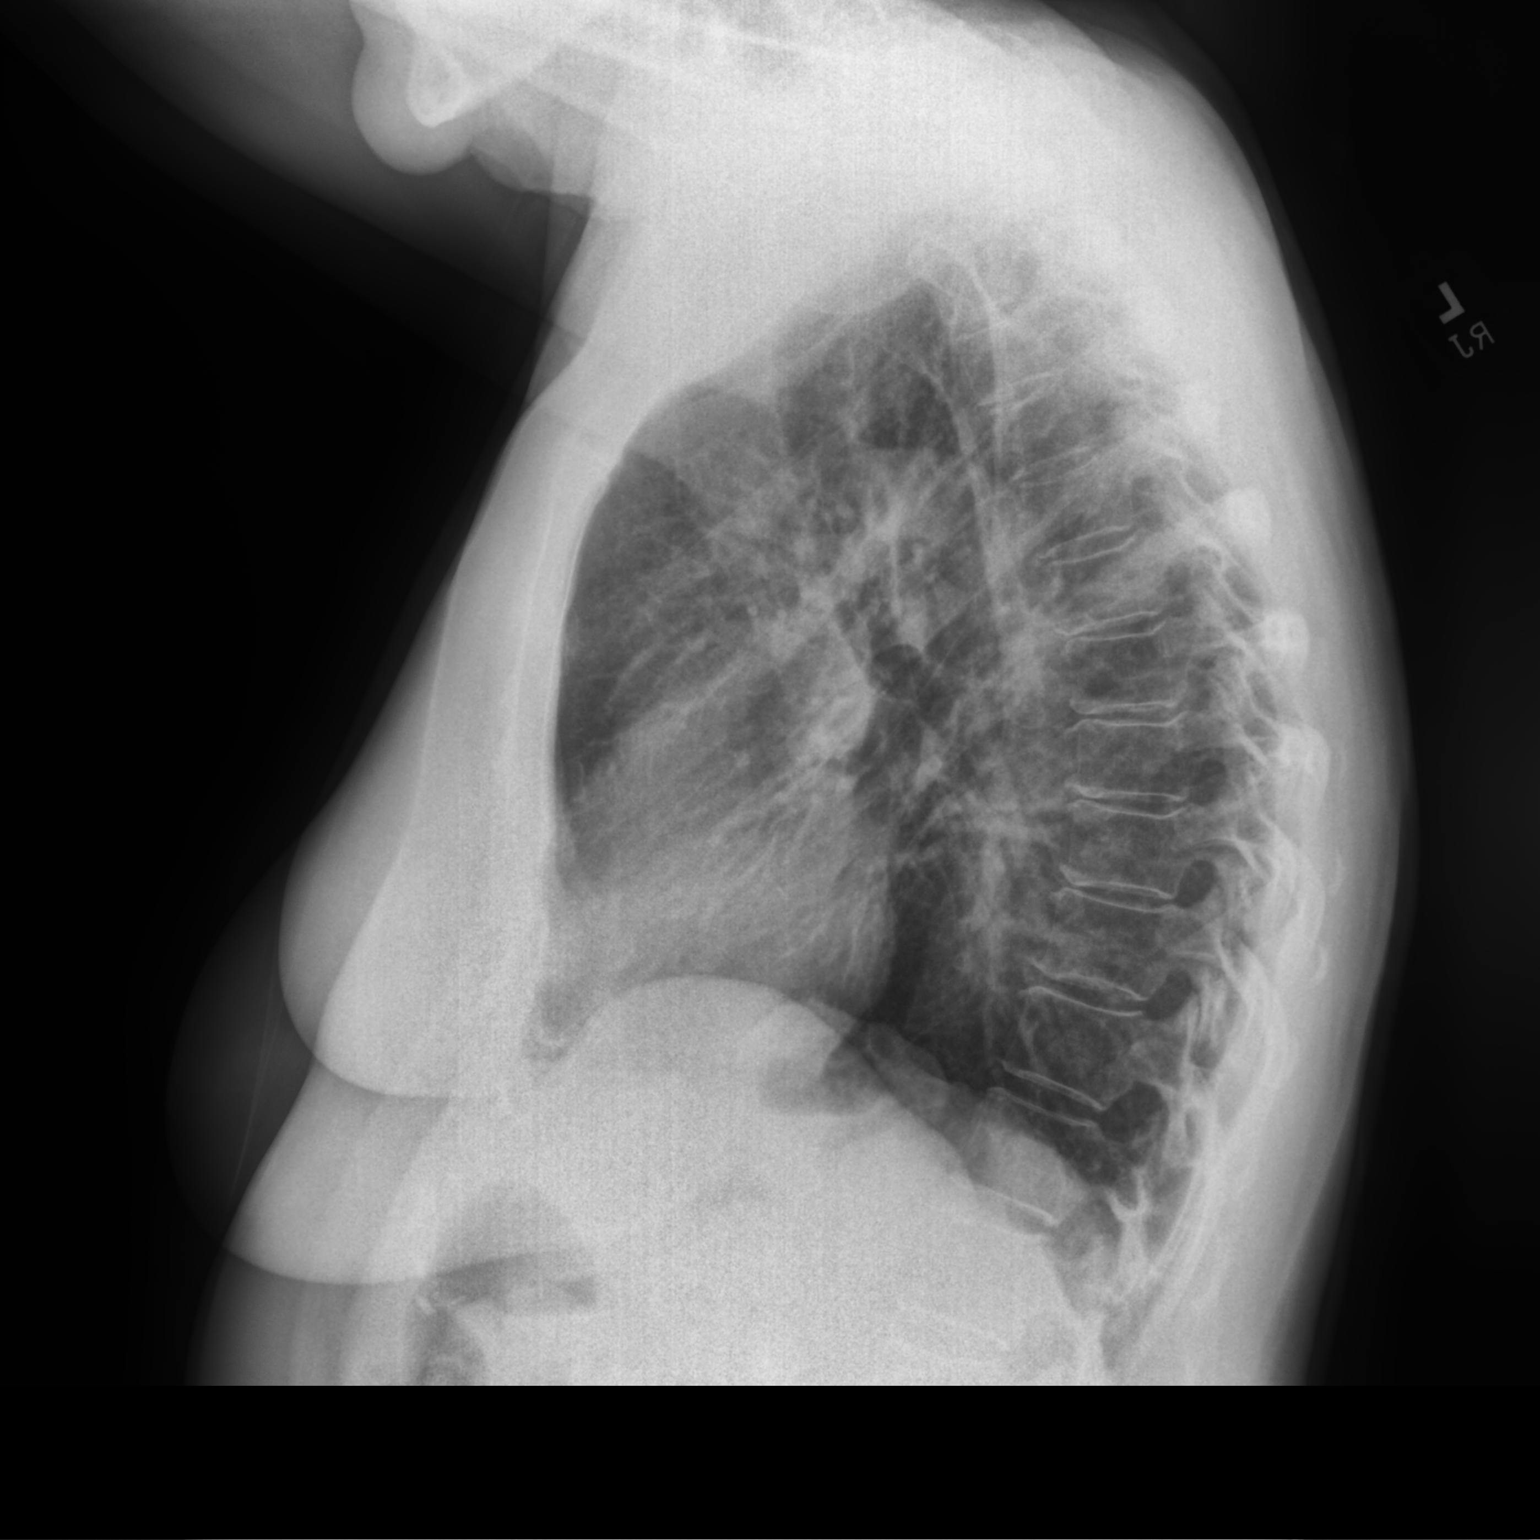

[2 of 2 positions shown; findings below may reference images not displayed]

FINDINGS: Cardiomediastinal silhouette unchanged in size and contour. No
evidence of central vascular congestion. No interlobular septal
thickening.

Improved lung volumes compared to the prior. Coarsened interstitial
markings throughout, with improved aeration compared to the prior.

No pneumothorax or pleural effusion. No new confluent airspace
disease.

No acute displaced fracture. Degenerative changes of the spine.
IMPRESSION: Improved aeration and lung volumes compared to the prior chest
x-ray, with background chronic changes and no definite evidence of
acute cardiopulmonary disease

## 2022-05-03 ENCOUNTER — Other Ambulatory Visit: Payer: Self-pay | Admitting: Family Medicine

## 2022-05-04 ENCOUNTER — Other Ambulatory Visit: Payer: Self-pay | Admitting: Hematology & Oncology

## 2022-05-22 DIAGNOSIS — Z08 Encounter for follow-up examination after completed treatment for malignant neoplasm: Secondary | ICD-10-CM | POA: Diagnosis not present

## 2022-05-22 DIAGNOSIS — L570 Actinic keratosis: Secondary | ICD-10-CM | POA: Diagnosis not present

## 2022-05-22 DIAGNOSIS — L821 Other seborrheic keratosis: Secondary | ICD-10-CM | POA: Diagnosis not present

## 2022-05-22 DIAGNOSIS — D225 Melanocytic nevi of trunk: Secondary | ICD-10-CM | POA: Diagnosis not present

## 2022-05-22 DIAGNOSIS — L814 Other melanin hyperpigmentation: Secondary | ICD-10-CM | POA: Diagnosis not present

## 2022-05-22 DIAGNOSIS — Z85820 Personal history of malignant melanoma of skin: Secondary | ICD-10-CM | POA: Diagnosis not present

## 2022-06-07 ENCOUNTER — Ambulatory Visit: Payer: Medicare PPO | Admitting: Family Medicine

## 2022-06-07 ENCOUNTER — Encounter: Payer: Self-pay | Admitting: Family Medicine

## 2022-06-07 VITALS — BP 112/68 | HR 81 | Temp 97.6°F | Ht 63.0 in | Wt 162.2 lb

## 2022-06-07 DIAGNOSIS — E663 Overweight: Secondary | ICD-10-CM | POA: Diagnosis not present

## 2022-06-07 DIAGNOSIS — E038 Other specified hypothyroidism: Secondary | ICD-10-CM

## 2022-06-07 DIAGNOSIS — E785 Hyperlipidemia, unspecified: Secondary | ICD-10-CM

## 2022-06-07 LAB — LIPID PANEL
Cholesterol: 161 mg/dL (ref 0–200)
HDL: 49.1 mg/dL (ref >39.00)
LDL Cholesterol: 89 mg/dL (ref 0–99)
NonHDL: 111.65
Total CHOL/HDL Ratio: 3
Triglycerides: 114 mg/dL (ref 0.0–149.0)
VLDL: 22.8 mg/dL (ref 0.0–40.0)

## 2022-06-07 LAB — HEPATIC FUNCTION PANEL
ALT: 30 U/L (ref 0–35)
AST: 19 U/L (ref 0–37)
Albumin: 4.5 g/dL (ref 3.5–5.2)
Alkaline Phosphatase: 56 U/L (ref 39–117)
Bilirubin, Direct: 0.1 mg/dL (ref 0.0–0.3)
Total Bilirubin: 0.4 mg/dL (ref 0.2–1.2)
Total Protein: 6.8 g/dL (ref 6.0–8.3)

## 2022-06-07 LAB — CBC WITH DIFFERENTIAL/PLATELET
Basophils Absolute: 0 10*3/uL (ref 0.0–0.1)
Basophils Relative: 0.7 % (ref 0.0–3.0)
Eosinophils Absolute: 0.2 10*3/uL (ref 0.0–0.7)
Eosinophils Relative: 3.4 % (ref 0.0–5.0)
HCT: 44.9 % (ref 36.0–46.0)
Hemoglobin: 15.4 g/dL — ABNORMAL HIGH (ref 12.0–15.0)
Lymphocytes Relative: 34.6 % (ref 12.0–46.0)
Lymphs Abs: 1.9 10*3/uL (ref 0.7–4.0)
MCHC: 34.2 g/dL (ref 30.0–36.0)
MCV: 89.7 fl (ref 78.0–100.0)
Monocytes Absolute: 0.5 10*3/uL (ref 0.1–1.0)
Monocytes Relative: 8.9 % (ref 3.0–12.0)
Neutro Abs: 2.8 10*3/uL (ref 1.4–7.7)
Neutrophils Relative %: 52.4 % (ref 43.0–77.0)
Platelets: 221 10*3/uL (ref 150.0–400.0)
RBC: 5 Mil/uL (ref 3.87–5.11)
RDW: 12.9 % (ref 11.5–15.5)
WBC: 5.4 10*3/uL (ref 4.0–10.5)

## 2022-06-07 LAB — TSH: TSH: 2.48 u[IU]/mL (ref 0.35–5.50)

## 2022-06-07 LAB — BASIC METABOLIC PANEL
BUN: 17 mg/dL (ref 6–23)
CO2: 32 mEq/L (ref 19–32)
Calcium: 9.9 mg/dL (ref 8.4–10.5)
Chloride: 101 mEq/L (ref 96–112)
Creatinine, Ser: 0.67 mg/dL (ref 0.40–1.20)
GFR: 87.38 mL/min (ref >60.00)
Glucose, Bld: 106 mg/dL — ABNORMAL HIGH (ref 70–99)
Potassium: 4.2 mEq/L (ref 3.5–5.1)
Sodium: 139 mEq/L (ref 135–145)

## 2022-06-07 NOTE — Progress Notes (Signed)
   Subjective:    Patient ID: Amy Perkins, female    DOB: 1951/02/21, 71 y.o.   MRN: [id number]  HPI Hyperlipidemia- chronic problem, on Simvastatin '40mg'$  daily.  No CP, SOB, abd pain, N/V  Hypothyroid- chronic problem, on Levothyroxine 81mg daily.  Pt reports energy level 'is pretty good'.  Denies changes to skin/hair/nails.  Overweight- weight is stable at 162.  BMI 28.73  Pt is walking daily   Review of Systems For ROS see HPI     Objective:   Physical Exam Vitals reviewed.  Constitutional:      General: She is not in acute distress.    Appearance: Normal appearance. She is well-developed. She is not ill-appearing.  HENT:     Head: Normocephalic and atraumatic.  Eyes:     Conjunctiva/sclera: Conjunctivae normal.     Pupils: Pupils are equal, round, and reactive to light.  Neck:     Thyroid: No thyromegaly.  Cardiovascular:     Rate and Rhythm: Normal rate and regular rhythm.     Pulses: Normal pulses.     Heart sounds: Normal heart sounds. No murmur heard. Pulmonary:     Effort: Pulmonary effort is normal. No respiratory distress.     Breath sounds: Normal breath sounds.  Abdominal:     General: There is no distension.     Palpations: Abdomen is soft.     Tenderness: There is no abdominal tenderness.  Musculoskeletal:     Cervical back: Normal range of motion and neck supple.     Right lower leg: No edema.     Left lower leg: No edema.  Lymphadenopathy:     Cervical: No cervical adenopathy.  Skin:    General: Skin is warm and dry.  Neurological:     Mental Status: She is alert and oriented to person, place, and time.  Psychiatric:        Behavior: Behavior normal.           Assessment & Plan:

## 2022-06-07 NOTE — Assessment & Plan Note (Signed)
Weight is stable and pt is walking regularly.  Applauded her efforts.  Will continue to follow.

## 2022-06-07 NOTE — Assessment & Plan Note (Signed)
Chronic problem.  Tolerating Simvastatin '40mg'$  daily w/o difficulty.  Check labs.  Adjust meds prn

## 2022-06-07 NOTE — Assessment & Plan Note (Signed)
Chronic problem.  Currently asymptomatic on Levothyroxine 37mg daily.  Check labs.  Adjust meds prn

## 2022-06-07 NOTE — Patient Instructions (Addendum)
Schedule your complete physical in 6 months We'll notify you of your lab results and make any changes if needed Continue to work on healthy diet and regular exercise- you're doing great!!! Call with any questions or concerns Stay Safe!  Stay Healthy! Happy Fall!!!

## 2022-06-08 DIAGNOSIS — F411 Generalized anxiety disorder: Secondary | ICD-10-CM | POA: Diagnosis not present

## 2022-06-08 DIAGNOSIS — F3342 Major depressive disorder, recurrent, in full remission: Secondary | ICD-10-CM | POA: Diagnosis not present

## 2022-06-08 NOTE — Progress Notes (Signed)
Pt seen results Via my chart  

## 2022-06-16 DIAGNOSIS — Z124 Encounter for screening for malignant neoplasm of cervix: Secondary | ICD-10-CM | POA: Diagnosis not present

## 2022-06-16 DIAGNOSIS — Z6828 Body mass index (BMI) 28.0-28.9, adult: Secondary | ICD-10-CM | POA: Diagnosis not present

## 2022-06-16 DIAGNOSIS — Z1231 Encounter for screening mammogram for malignant neoplasm of breast: Secondary | ICD-10-CM | POA: Diagnosis not present

## 2022-06-21 ENCOUNTER — Other Ambulatory Visit: Payer: Self-pay | Admitting: Pulmonary Disease

## 2022-06-26 DIAGNOSIS — L821 Other seborrheic keratosis: Secondary | ICD-10-CM | POA: Diagnosis not present

## 2022-06-26 DIAGNOSIS — D225 Melanocytic nevi of trunk: Secondary | ICD-10-CM | POA: Diagnosis not present

## 2022-06-26 DIAGNOSIS — D492 Neoplasm of unspecified behavior of bone, soft tissue, and skin: Secondary | ICD-10-CM | POA: Diagnosis not present

## 2022-06-26 DIAGNOSIS — L814 Other melanin hyperpigmentation: Secondary | ICD-10-CM | POA: Diagnosis not present

## 2022-06-26 DIAGNOSIS — L538 Other specified erythematous conditions: Secondary | ICD-10-CM | POA: Diagnosis not present

## 2022-07-05 ENCOUNTER — Other Ambulatory Visit: Payer: Self-pay | Admitting: Hematology & Oncology

## 2022-08-22 ENCOUNTER — Ambulatory Visit: Payer: Medicare PPO | Admitting: Podiatry

## 2022-08-22 DIAGNOSIS — M722 Plantar fascial fibromatosis: Secondary | ICD-10-CM | POA: Diagnosis not present

## 2022-08-22 DIAGNOSIS — M76822 Posterior tibial tendinitis, left leg: Secondary | ICD-10-CM

## 2022-08-22 MED ORDER — BETAMETHASONE SOD PHOS & ACET 6 (3-3) MG/ML IJ SUSP
3.0000 mg | Freq: Once | INTRAMUSCULAR | Status: AC
  Administered 2022-08-22: 3 mg via INTRA_ARTICULAR

## 2022-08-22 MED ORDER — METHYLPREDNISOLONE 4 MG PO TBPK: ORAL_TABLET | ORAL | 0 refills | Status: DC

## 2022-08-22 NOTE — Progress Notes (Signed)
   Chief Complaint  Patient presents with   Foot Problem    Patient here for PF in the left foot, patient experiencing swelling in the same foot , midlateral side     Subjective: 72 y.o. female presenting today for an acute flareup of left heel pain.  Patient states that her plantar fasciitis has been somewhat symptomatic over the past month.  She has also noticed some pain and tenderness along the medial aspect of the left foot.  Presenting for further treatment and evaluation.  Denies a history of injury.  Past Medical History:  Diagnosis Date   Depression    Hyperlipidemia    Migraines    Rectal bleeding 05/26/2020   Squamous cell carcinoma 09/20/2017   chest   Past Surgical History:  Procedure Laterality Date   DILATION AND CURETTAGE OF UTERUS     x2   laser vein surgery     TONSILLECTOMY     No Known Allergies  Objective: Physical Exam General: The patient is alert and oriented x3 in no acute distress.  Dermatology: Skin is warm, dry and supple bilateral lower extremities. Negative for open lesions or macerations bilateral.  Hyperkeratotic dystrophic nail noted to the right hallux nail plate which is loosely adhered to the underlying nail bed.  Vascular: Dorsalis Pedis and Posterior Tibial pulses palpable bilateral.  Capillary fill time is immediate to all digits.  Neurological: Epicritic and protective threshold intact bilateral.   Musculoskeletal: Tenderness to palpation to the plantar aspect of the left heel along the plantar fascia as well as along posterior tibial tendon as it inserts onto the navicular tuberosity. All other joints range of motion within normal limits bilateral. Strength 5/5 in all groups bilateral.   Assessment: 1. Plantar fasciitis left foot 2.  Insertional posterior tibial tendinitis left  Plan of Care:  1. Patient evaluated. Xrays reviewed.   2. Injection of 0.5cc Celestone soluspan injected into the left plantar fascia and towards the  insertion of the posterior tibial tendon as it inserts onto the navicular tuberosity.  3. Rx for Medrol Dose Pak placed 4.  Continue Tylenol OTC as needed.  Patient is on anticoagulant and cannot take NSAIDs 5.  Continue OTC power step insoles.  New OTC power step insoles were dispensed today 6. Instructed patient regarding therapies and modalities at home to alleviate symptoms.  7.  Return to clinic as needed  Edrick Kins, DPM [facility]  Dr. Edrick Kins, DPM    [address], [place] [address]                Office [phone]  Fax [phone]

## 2022-09-06 ENCOUNTER — Other Ambulatory Visit: Payer: Self-pay | Admitting: Hematology & Oncology

## 2022-09-25 DIAGNOSIS — Z08 Encounter for follow-up examination after completed treatment for malignant neoplasm: Secondary | ICD-10-CM | POA: Diagnosis not present

## 2022-09-25 DIAGNOSIS — L570 Actinic keratosis: Secondary | ICD-10-CM | POA: Diagnosis not present

## 2022-09-25 DIAGNOSIS — L821 Other seborrheic keratosis: Secondary | ICD-10-CM | POA: Diagnosis not present

## 2022-09-25 DIAGNOSIS — Z85820 Personal history of malignant melanoma of skin: Secondary | ICD-10-CM | POA: Diagnosis not present

## 2022-09-25 DIAGNOSIS — L814 Other melanin hyperpigmentation: Secondary | ICD-10-CM | POA: Diagnosis not present

## 2022-09-25 DIAGNOSIS — D225 Melanocytic nevi of trunk: Secondary | ICD-10-CM | POA: Diagnosis not present

## 2022-09-26 ENCOUNTER — Encounter: Payer: Self-pay | Admitting: Medical Oncology

## 2022-09-26 ENCOUNTER — Inpatient Hospital Stay: Payer: Medicare PPO | Admitting: Medical Oncology

## 2022-09-26 ENCOUNTER — Inpatient Hospital Stay: Payer: Medicare PPO | Attending: Hematology & Oncology

## 2022-09-26 VITALS — BP 128/68 | HR 68 | Temp 98.0°F | Resp 17 | Wt 162.0 lb

## 2022-09-26 DIAGNOSIS — D6859 Other primary thrombophilia: Secondary | ICD-10-CM | POA: Diagnosis not present

## 2022-09-26 DIAGNOSIS — Z7901 Long term (current) use of anticoagulants: Secondary | ICD-10-CM | POA: Insufficient documentation

## 2022-09-26 DIAGNOSIS — D6851 Activated protein C resistance: Secondary | ICD-10-CM

## 2022-09-26 DIAGNOSIS — Z86711 Personal history of pulmonary embolism: Secondary | ICD-10-CM

## 2022-09-26 LAB — CBC WITH DIFFERENTIAL (CANCER CENTER ONLY)
Abs Immature Granulocytes: 0.02 10*3/uL (ref 0.00–0.07)
Basophils Absolute: 0 10*3/uL (ref 0.0–0.1)
Basophils Relative: 1 %
Eosinophils Absolute: 0.1 10*3/uL (ref 0.0–0.5)
Eosinophils Relative: 2 %
HCT: 43.7 % (ref 36.0–46.0)
Hemoglobin: 14.8 g/dL (ref 12.0–15.0)
Immature Granulocytes: 0 %
Lymphocytes Relative: 26 %
Lymphs Abs: 1.7 10*3/uL (ref 0.7–4.0)
MCH: 30.7 pg (ref 26.0–34.0)
MCHC: 33.9 g/dL (ref 30.0–36.0)
MCV: 90.7 fL (ref 80.0–100.0)
Monocytes Absolute: 0.3 10*3/uL (ref 0.1–1.0)
Monocytes Relative: 5 %
Neutro Abs: 4.4 10*3/uL (ref 1.7–7.7)
Neutrophils Relative %: 66 %
Platelet Count: 209 10*3/uL (ref 150–400)
RBC: 4.82 MIL/uL (ref 3.87–5.11)
RDW: 12.2 % (ref 11.5–15.5)
WBC Count: 6.7 10*3/uL (ref 4.0–10.5)
nRBC: 0 % (ref 0.0–0.2)

## 2022-09-26 LAB — CMP (CANCER CENTER ONLY)
ALT: 22 U/L (ref 0–44)
AST: 16 U/L (ref 15–41)
Albumin: 4.2 g/dL (ref 3.5–5.0)
Alkaline Phosphatase: 53 U/L (ref 38–126)
Anion gap: 7 (ref 5–15)
BUN: 16 mg/dL (ref 8–23)
CO2: 29 mmol/L (ref 22–32)
Calcium: 9.7 mg/dL (ref 8.9–10.3)
Chloride: 101 mmol/L (ref 98–111)
Creatinine: 0.73 mg/dL (ref 0.44–1.00)
GFR, Estimated: >60 mL/min (ref >60)
Glucose, Bld: 177 mg/dL — ABNORMAL HIGH (ref 70–99)
Potassium: 3.9 mmol/L (ref 3.5–5.1)
Sodium: 137 mmol/L (ref 135–145)
Total Bilirubin: 0.4 mg/dL (ref 0.3–1.2)
Total Protein: 6.3 g/dL — ABNORMAL LOW (ref 6.5–8.1)

## 2022-09-26 LAB — D-DIMER, QUANTITATIVE: D-Dimer, Quant: 0.35 ug/mL-FEU (ref 0.00–0.50)

## 2022-09-26 LAB — LACTATE DEHYDROGENASE: LDH: 175 U/L (ref 98–192)

## 2022-09-26 NOTE — Progress Notes (Signed)
Hematology and Oncology Follow Up Visit  Amy Perkins [id number] 28-Oct-1950 72 y.o. 09/26/2022   Principle Diagnosis:  Pulmonary Embolism -- Facor V Leiden/Prothrombin II mutation  Current Therapy:   Eliquis 5 mg po BID -- lifelong     Interim History:  Amy Perkins is back for follow-up.  She comes yearly. She reports that she has been doing really well since our last visit.   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   She has not had any problems with eliquis including any bleeding or significant bruising episodes. No hemoptysis, SOB, chest pain or calf pain.   She has had no fever.  There is no cough.  She has had no weight loss or weight gain.  There is no change in bowel or bladder habits.  She has had no leg swelling.  Overall, I would say her performance status is probably ECOG 1.    Wt Readings from Last 3 Encounters:  09/26/22 162 lb (73.5 kg)  06/07/22 162 lb 3.2 oz (73.6 kg)  12/07/21 160 lb 9.6 oz (72.8 kg)    Medications:  Current Outpatient Medications:    ALPRAZolam (XANAX) 0.5 MG tablet, Take 0.5 mg by mouth See admin instructions. Take 1/2 tablet by mouth 2 times daily (morning and 3PM) , and 1 tablet at bedtime., Disp: , Rfl:    Biotin 1000 MCG CHEW, Chew by mouth., Disp: , Rfl:    citalopram (CELEXA) 40 MG tablet, Take 1 tablet (40 mg total) by mouth daily., Disp: 30 tablet, Rfl: 5   ELIQUIS 5 MG TABS tablet, TAKE 1 TABLET BY MOUTH TWICE A DAY, Disp: 60 tablet, Rfl: 1   levothyroxine (SYNTHROID) 50 MCG tablet, TAKE 1 TABLET BY MOUTH EVERY DAY, Disp: 90 tablet, Rfl: 1   omeprazole (PRILOSEC) 20 MG capsule, TAKE 1 CAPSULE BY MOUTH TWICE A DAY, Disp: 180 capsule, Rfl: 1   simvastatin (ZOCOR) 40 MG tablet, TAKE 1 TABLET BY MOUTH EVERYDAY AT BEDTIME, Disp: 90 tablet, Rfl: 1  Current Facility-Administered Medications:    betamethasone acetate-betamethasone sodium phosphate (CELESTONE) injection 3  mg, 3 mg, Intra-articular, Once, Evans, Dorathy Daft, DPM  Allergies: No Known Allergies  Past Medical History, Surgical history, Social history, and Family History were reviewed and updated.  Review of Systems: Review of Systems  Constitutional: Negative.   HENT:  Negative.    Eyes: Negative.   Respiratory: Negative.    Cardiovascular: Negative.   Gastrointestinal: Negative.   Endocrine: Negative.   Genitourinary: Negative.    Musculoskeletal: Negative.   Skin: Negative.   Neurological: Negative.   Hematological: Negative.   Psychiatric/Behavioral: Negative.      Physical Exam:  weight is 162 lb (73.5 kg). Her oral temperature is 98 F (36.7 C). Her blood pressure is 128/68 and her pulse is 68. Her respiration is 17 and oxygen saturation is 97%.   Wt Readings from Last 3 Encounters:  09/26/22 162 lb (73.5 kg)  06/07/22 162 lb 3.2 oz (73.6 kg)  12/07/21 160 lb 9.6 oz (72.8 kg)    Physical Exam Vitals reviewed.  HENT:     Head: Normocephalic and atraumatic.  Eyes:     Pupils: Pupils are equal, round, and reactive to light.  Cardiovascular:     Rate and Rhythm: Normal rate and regular rhythm.     Heart sounds: Normal heart sounds.  Pulmonary:     Effort: Pulmonary effort is  normal.     Breath sounds: Normal breath sounds.  Abdominal:     General: Bowel sounds are normal.     Palpations: Abdomen is soft.  Musculoskeletal:        General: No tenderness or deformity. Normal range of motion.     Cervical back: Normal range of motion.  Lymphadenopathy:     Cervical: No cervical adenopathy.  Skin:    General: Skin is warm and dry.     Findings: No erythema or rash.  Neurological:     Mental Status: She is alert and oriented to person, place, and time.  Psychiatric:        Behavior: Behavior normal.        Thought Content: Thought content normal.        Judgment: Judgment normal.    Lab Results  Component Value Date   WBC 6.7 09/26/2022   HGB 14.8 09/26/2022    HCT 43.7 09/26/2022   MCV 90.7 09/26/2022   PLT 209 09/26/2022     Chemistry      Component Value Date/Time   NA 137 09/26/2022 0935   K 3.9 09/26/2022 0935   CL 101 09/26/2022 0935   CO2 29 09/26/2022 0935   BUN 16 09/26/2022 0935   CREATININE 0.73 09/26/2022 0935      Component Value Date/Time   CALCIUM 9.7 09/26/2022 0935   ALKPHOS 53 09/26/2022 0935   AST 16 09/26/2022 0935   ALT 22 09/26/2022 0935   BILITOT 0.4 09/26/2022 0935      Impression and Plan: Amy Perkins is a very charming 72 year old white female.  She has 2 hypercoagulable state.  She had a pulmonary embolism.  She is on lifelong anticoagulation.  She is doing really well. Labs are stable today (non-fasting glucose). She will continue her general follow up with her PCP and continue Eliquis at current dose. We will see her back in 1 year or sooner as needed.    Hughie Closs, PA-C 2/6/202410:34 AM

## 2022-09-26 NOTE — Addendum Note (Signed)
Addended by: Nelwyn Salisbury on: 09/26/2022 10:43 AM   Modules accepted: Orders

## 2022-10-19 ENCOUNTER — Other Ambulatory Visit: Payer: Self-pay | Admitting: Family Medicine

## 2022-11-01 ENCOUNTER — Other Ambulatory Visit: Payer: Self-pay | Admitting: Hematology & Oncology

## 2022-11-05 ENCOUNTER — Other Ambulatory Visit: Payer: Self-pay | Admitting: Family Medicine

## 2022-12-11 ENCOUNTER — Ambulatory Visit (INDEPENDENT_AMBULATORY_CARE_PROVIDER_SITE_OTHER): Payer: Medicare PPO | Admitting: Family Medicine

## 2022-12-11 ENCOUNTER — Telehealth: Payer: Self-pay

## 2022-12-11 ENCOUNTER — Encounter: Payer: Self-pay | Admitting: Family Medicine

## 2022-12-11 VITALS — BP 118/80 | HR 71 | Temp 98.2°F | Resp 16 | Ht 63.0 in | Wt 162.2 lb

## 2022-12-11 DIAGNOSIS — R079 Chest pain, unspecified: Secondary | ICD-10-CM

## 2022-12-11 DIAGNOSIS — E785 Hyperlipidemia, unspecified: Secondary | ICD-10-CM

## 2022-12-11 DIAGNOSIS — Z0000 Encounter for general adult medical examination without abnormal findings: Secondary | ICD-10-CM | POA: Diagnosis not present

## 2022-12-11 DIAGNOSIS — H6123 Impacted cerumen, bilateral: Secondary | ICD-10-CM | POA: Diagnosis not present

## 2022-12-11 LAB — HEPATIC FUNCTION PANEL
ALT: 27 U/L (ref 0–35)
AST: 20 U/L (ref 0–37)
Albumin: 4.3 g/dL (ref 3.5–5.2)
Alkaline Phosphatase: 53 U/L (ref 39–117)
Bilirubin, Direct: 0.1 mg/dL (ref 0.0–0.3)
Total Bilirubin: 0.4 mg/dL (ref 0.2–1.2)
Total Protein: 6.5 g/dL (ref 6.0–8.3)

## 2022-12-11 LAB — CBC WITH DIFFERENTIAL/PLATELET
Basophils Absolute: 0 10*3/uL (ref 0.0–0.1)
Basophils Relative: 0.6 % (ref 0.0–3.0)
Eosinophils Absolute: 0.2 10*3/uL (ref 0.0–0.7)
Eosinophils Relative: 2.8 % (ref 0.0–5.0)
HCT: 44.6 % (ref 36.0–46.0)
Hemoglobin: 15.6 g/dL — ABNORMAL HIGH (ref 12.0–15.0)
Lymphocytes Relative: 30.4 % (ref 12.0–46.0)
Lymphs Abs: 2 10*3/uL (ref 0.7–4.0)
MCHC: 34.9 g/dL (ref 30.0–36.0)
MCV: 90 fl (ref 78.0–100.0)
Monocytes Absolute: 0.5 10*3/uL (ref 0.1–1.0)
Monocytes Relative: 7.4 % (ref 3.0–12.0)
Neutro Abs: 3.9 10*3/uL (ref 1.4–7.7)
Neutrophils Relative %: 58.8 % (ref 43.0–77.0)
Platelets: 222 10*3/uL (ref 150.0–400.0)
RBC: 4.96 Mil/uL (ref 3.87–5.11)
RDW: 13 % (ref 11.5–15.5)
WBC: 6.6 10*3/uL (ref 4.0–10.5)

## 2022-12-11 LAB — LIPID PANEL
Cholesterol: 181 mg/dL (ref 0–200)
HDL: 44.3 mg/dL (ref >39.00)
LDL Cholesterol: 106 mg/dL — ABNORMAL HIGH (ref 0–99)
NonHDL: 137.06
Total CHOL/HDL Ratio: 4
Triglycerides: 157 mg/dL — ABNORMAL HIGH (ref 0.0–149.0)
VLDL: 31.4 mg/dL (ref 0.0–40.0)

## 2022-12-11 LAB — BASIC METABOLIC PANEL
BUN: 17 mg/dL (ref 6–23)
CO2: 32 mEq/L (ref 19–32)
Calcium: 9.8 mg/dL (ref 8.4–10.5)
Chloride: 101 mEq/L (ref 96–112)
Creatinine, Ser: 0.76 mg/dL (ref 0.40–1.20)
GFR: 78.06 mL/min (ref >60.00)
Glucose, Bld: 108 mg/dL — ABNORMAL HIGH (ref 70–99)
Potassium: 4.2 mEq/L (ref 3.5–5.1)
Sodium: 138 mEq/L (ref 135–145)

## 2022-12-11 LAB — TSH: TSH: 2.21 u[IU]/mL (ref 0.35–5.50)

## 2022-12-11 NOTE — Progress Notes (Signed)
Subjective:    Patient ID: Amy Perkins, female    DOB: [birth year], 72 y.o.   MRN: [id number]  HPI CPE- UTD on mammo, colonoscopy, immunizations  Patient Care Team    Relationship Specialty Notifications Start End  Sheliah Hatch, MD PCP - General   08/03/10   Charna Elizabeth, MD Consulting Physician Gastroenterology  10/04/15   Levert Feinstein, MD Consulting Physician Neurology  10/04/15   Marcelle Overlie, MD Consulting Physician Obstetrics and Gynecology  10/04/15   [facility]    10/10/17   Maris Berger, MD Consulting Physician Ophthalmology  10/10/17   [place] [place]  Dentistry  10/10/17   Zannie Kehr  Dermatology  10/10/17   Arthur Holms, MD (Inactive) Consulting Physician Hematology  10/16/18   Mack Hook, MD Consulting Physician Orthopedic Surgery  10/16/18      Health Maintenance  Topic Date Due   COVID-19 Vaccine (7 - 2023-24 season) 07/27/2022   Medicare Annual Wellness (AWV)  02/03/2023   MAMMOGRAM  06/08/2023 (Originally 05/16/2022)   INFLUENZA VACCINE  03/22/2023   COLONOSCOPY (Pts 45-48yrs Insurance coverage will need to be confirmed)  08/04/2027   Pneumonia Vaccine 79+ Years old  Completed   DEXA SCAN  Completed   Hepatitis C Screening  Completed   HPV VACCINES  Aged Out   DTaP/Tdap/Td  Discontinued   Zoster Vaccines- Shingrix  Discontinued      Review of Systems Patient reports no vision changes, adenopathy,fever, weight change,  persistant/recurrent hoarseness , swallowing issues, palpitations, edema, persistant/recurrent cough, hemoptysis, dyspnea (rest/exertional/paroxysmal nocturnal), gastrointestinal bleeding (melena, rectal bleeding), abdominal pain, significant heartburn, bowel changes, GU symptoms (dysuria, hematuria, incontinence), Gyn symptoms (abnormal  bleeding, pain),  syncope, focal weakness, memory loss, numbness & tingling, skin/hair/nail changes, abnormal bruising or bleeding, anxiety, or depression.   + hearing loss + chest pain-  described as an 'ache'.  Worse at night.  Some relief w/ PPI.  Pt reports dad and grandfather both had MI's.  She would like to see Cardiology as a 'baseline'.    Objective:   Physical Exam General Appearance:    Alert, cooperative, no distress, appears stated age  Head:    Normocephalic, without obvious abnormality, atraumatic  Eyes:    PERRL, conjunctiva/corneas clear, EOM's intact both eyes  Ears:    Cerumen impaction bilaterally.  Pt consented to irrigation.  L ear was successfully cleaned and pt tolerated w/o difficulty.  R ear has hard, adhered cerumen and ear canal began to bleed so procedure was terminated despite pt being in no distress  Nose:   Nares normal, septum midline, mucosa normal, no drainage    or sinus tenderness  Throat:   Lips, mucosa, and tongue normal; teeth and gums normal  Neck:   Supple, symmetrical, trachea midline, no adenopathy;    Thyroid: no enlargement/tenderness/nodules  Back:     Symmetric, no curvature, ROM normal, no CVA tenderness  Lungs:     Clear to auscultation bilaterally, respirations unlabored  Chest Wall:    No tenderness or deformity   Heart:    Regular rate and rhythm, S1 and S2 normal, no murmur, rub   or gallop  Breast Exam:    Deferred to GYN  Abdomen:     Soft, non-tender, bowel sounds active all four quadrants,    no masses, no organomegaly  Genitalia:    Deferred to GYN  Rectal:    Extremities:   Extremities normal, atraumatic, no cyanosis or edema  Pulses:   2+  and symmetric all extremities  Skin:   Skin color, texture, turgor normal, no rashes or lesions  Lymph nodes:   Cervical, supraclavicular, and axillary nodes normal  Neurologic:   CNII-XII intact, normal strength, sensation and reflexes    throughout          Assessment & Plan:   Chest pain- new.  Pt reports sxs are worse at night and she gets some relief from acid reflux medication.  This makes cardiology etiology less likely but both her father and grandfather had  MI's- grandfather in his early 87s.  Given her hyperlipidemia will refer to Cardiology for baseline assessment.  Cerumen impaction- new.  Bilateral.  L ear was easily irrigated and TM WNL.  R ear had adhered cerumen and canal started to bleed.  Procedure was stopped and we will place ENT referral for removal.  Pt expressed understanding and is in agreement w/ plan.

## 2022-12-11 NOTE — Patient Instructions (Addendum)
Follow up in 6 months to recheck cholesterol We'll notify you of your lab results and make any changes if needed Continue to work on low carb diet and regular exercise We'll call you to schedule your cardiology and ENT appts Saturate a cotton ball w/ peroxide and drip it into your R ear to soften that wax Call with any questions or concerns Stay Safe!  Stay Healthy! Happy Spring!!

## 2022-12-11 NOTE — Assessment & Plan Note (Signed)
Chronic problem.  Tolerating statin w/o difficulty.  Check labs.  Adjust meds prn  

## 2022-12-11 NOTE — Telephone Encounter (Signed)
-----   Message from Sheliah Hatch, MD sent at 12/11/2022  4:08 PM EDT ----- Labs look great!  No changes at this time

## 2022-12-11 NOTE — Assessment & Plan Note (Signed)
Pt's PE WNL w/ exception of cerumen impaction.  UTD on mammo, colonoscopy, immunizations.  Check labs.  Anticipatory guidance provided.

## 2022-12-11 NOTE — Telephone Encounter (Signed)
Left lab results on VM 

## 2023-01-02 ENCOUNTER — Other Ambulatory Visit: Payer: Self-pay | Admitting: Hematology & Oncology

## 2023-01-10 DIAGNOSIS — H6123 Impacted cerumen, bilateral: Secondary | ICD-10-CM | POA: Diagnosis not present

## 2023-01-10 DIAGNOSIS — H903 Sensorineural hearing loss, bilateral: Secondary | ICD-10-CM | POA: Diagnosis not present

## 2023-01-16 DIAGNOSIS — H903 Sensorineural hearing loss, bilateral: Secondary | ICD-10-CM | POA: Insufficient documentation

## 2023-02-08 ENCOUNTER — Ambulatory Visit (INDEPENDENT_AMBULATORY_CARE_PROVIDER_SITE_OTHER): Payer: Medicare PPO | Admitting: *Deleted

## 2023-02-08 DIAGNOSIS — Z0000 Encounter for general adult medical examination without abnormal findings: Secondary | ICD-10-CM

## 2023-02-08 NOTE — Patient Instructions (Signed)
Amy Perkins , Thank you for taking time to come for your Medicare Wellness Visit. I appreciate your ongoing commitment to your health goals. Please review the following plan we discussed and let me know if I can assist you in the future.   Screening recommendations/referrals: Colonoscopy: up to date Mammogram: up to date Bone Density: up to date Recommended yearly ophthalmology/optometry visit for glaucoma screening and checkup Recommended yearly dental visit for hygiene and checkup  Vaccinations: Influenza vaccine: up to date Pneumococcal vaccine: up to date Tdap vaccine: Education provided Shingles vaccine: Education provided    Advanced directives: on file     Preventive Care 65 Years and Older, Female Preventive care refers to lifestyle choices and visits with your health care provider that can promote health and wellness. What does preventive care include? A yearly physical exam. This is also called an annual well check. Dental exams once or twice a year. Routine eye exams. Ask your health care provider how often you should have your eyes checked. Personal lifestyle choices, including: Daily care of your teeth and gums. Regular physical activity. Eating a healthy diet. Avoiding tobacco and drug use. Limiting alcohol use. Practicing safe sex. Taking low-dose aspirin every day. Taking vitamin and mineral supplements as recommended by your health care provider. What happens during an annual well check? The services and screenings done by your health care provider during your annual well check will depend on your age, overall health, lifestyle risk factors, and family history of disease. Counseling  Your health care provider may ask you questions about your: Alcohol use. Tobacco use. Drug use. Emotional well-being. Home and relationship well-being. Sexual activity. Eating habits. History of falls. Memory and ability to understand (cognition). Work and work  Astronomer. Reproductive health. Screening  You may have the following tests or measurements: Height, weight, and BMI. Blood pressure. Lipid and cholesterol levels. These may be checked every 5 years, or more frequently if you are over 59 years old. Skin check. Lung cancer screening. You may have this screening every year starting at age 38 if you have a 30-pack-year history of smoking and currently smoke or have quit within the past 15 years. Fecal occult blood test (FOBT) of the stool. You may have this test every year starting at age 54. Flexible sigmoidoscopy or colonoscopy. You may have a sigmoidoscopy every 5 years or a colonoscopy every 10 years starting at age 43. Hepatitis C blood test. Hepatitis B blood test. Sexually transmitted disease (STD) testing. Diabetes screening. This is done by checking your blood sugar (glucose) after you have not eaten for a while (fasting). You may have this done every 1-3 years. Bone density scan. This is done to screen for osteoporosis. You may have this done starting at age 22. Mammogram. This may be done every 1-2 years. Talk to your health care provider about how often you should have regular mammograms. Talk with your health care provider about your test results, treatment options, and if necessary, the need for more tests. Vaccines  Your health care provider may recommend certain vaccines, such as: Influenza vaccine. This is recommended every year. Tetanus, diphtheria, and acellular pertussis (Tdap, Td) vaccine. You may need a Td booster every 10 years. Zoster vaccine. You may need this after age 1. Pneumococcal 13-valent conjugate (PCV13) vaccine. One dose is recommended after age 36. Pneumococcal polysaccharide (PPSV23) vaccine. One dose is recommended after age 93. Talk to your health care provider about which screenings and vaccines you need and how often  you need them. This information is not intended to replace advice given to you by  your health care provider. Make sure you discuss any questions you have with your health care provider. Document Released: 09/03/2015 Document Revised: 04/26/2016 Document Reviewed: 06/08/2015 Elsevier Interactive Patient Education  2017 [employer] Prevention in the Home Falls can cause injuries. They can happen to people of all ages. There are many things you can do to make your home safe and to help prevent falls. What can I do on the outside of my home? Regularly fix the edges of walkways and driveways and fix any cracks. Remove anything that might make you trip as you walk through a door, such as a raised step or threshold. Trim any bushes or trees on the path to your home. Use bright outdoor lighting. Clear any walking paths of anything that might make someone trip, such as rocks or tools. Regularly check to see if handrails are loose or broken. Make sure that both sides of any steps have handrails. Any raised decks and porches should have guardrails on the edges. Have any leaves, snow, or ice cleared regularly. Use sand or salt on walking paths during winter. Clean up any spills in your garage right away. This includes oil or grease spills. What can I do in the bathroom? Use night lights. Install grab bars by the toilet and in the tub and shower. Do not use towel bars as grab bars. Use non-skid mats or decals in the tub or shower. If you need to sit down in the shower, use a plastic, non-slip stool. Keep the floor dry. Clean up any water that spills on the floor as soon as it happens. Remove soap buildup in the tub or shower regularly. Attach bath mats securely with double-sided non-slip rug tape. Do not have throw rugs and other things on the floor that can make you trip. What can I do in the bedroom? Use night lights. Make sure that you have a light by your bed that is easy to reach. Do not use any sheets or blankets that are too big for your bed. They should not hang  down onto the floor. Have a firm chair that has side arms. You can use this for support while you get dressed. Do not have throw rugs and other things on the floor that can make you trip. What can I do in the kitchen? Clean up any spills right away. Avoid walking on wet floors. Keep items that you use a lot in easy-to-reach places. If you need to reach something above you, use a strong step stool that has a grab bar. Keep electrical cords out of the way. Do not use floor polish or wax that makes floors slippery. If you must use wax, use non-skid floor wax. Do not have throw rugs and other things on the floor that can make you trip. What can I do with my stairs? Do not leave any items on the stairs. Make sure that there are handrails on both sides of the stairs and use them. Fix handrails that are broken or loose. Make sure that handrails are as long as the stairways. Check any carpeting to make sure that it is firmly attached to the stairs. Fix any carpet that is loose or worn. Avoid having throw rugs at the top or bottom of the stairs. If you do have throw rugs, attach them to the floor with carpet tape. Make sure that you have a light  switch at the top of the stairs and the bottom of the stairs. If you do not have them, ask someone to add them for you. What else can I do to help prevent falls? Wear shoes that: Do not have high heels. Have rubber bottoms. Are comfortable and fit you well. Are closed at the toe. Do not wear sandals. If you use a stepladder: Make sure that it is fully opened. Do not climb a closed stepladder. Make sure that both sides of the stepladder are locked into place. Ask someone to hold it for you, if possible. Clearly mark and make sure that you can see: Any grab bars or handrails. First and last steps. Where the edge of each step is. Use tools that help you move around (mobility aids) if they are needed. These  include: Canes. Walkers. Scooters. Crutches. Turn on the lights when you go into a dark area. Replace any light bulbs as soon as they burn out. Set up your furniture so you have a clear path. Avoid moving your furniture around. If any of your floors are uneven, fix them. If there are any pets around you, be aware of where they are. Review your medicines with your doctor. Some medicines can make you feel dizzy. This can increase your chance of falling. Ask your doctor what other things that you can do to help prevent falls. This information is not intended to replace advice given to you by your health care provider. Make sure you discuss any questions you have with your health care provider. Document Released: 06/03/2009 Document Revised: 01/13/2016 Document Reviewed: 09/11/2014 Elsevier Interactive Patient Education  2017 [employer].

## 2023-02-08 NOTE — Progress Notes (Signed)
Subjective:   Amy Perkins is a 71 y.o. female who presents for Medicare Annual (Subsequent) preventive examination.  Visit Complete: Virtual  I connected with  Amy Perkins on 02/08/23 by a audio enabled telemedicine application and verified that I am speaking with the correct person using two identifiers.  Patient Location: Home  Provider Location: Home Office  I discussed the limitations of evaluation and management by telemedicine. The patient expressed understanding and agreed to proceed.  Patient Medicare AWV questionnaire was completed by the patient on 02-07-2023; I have confirmed that all information answered by patient is correct and no changes since this date.  Review of Systems     Cardiac Risk Factors include: advanced age (>59men, >69 women);family history of premature cardiovascular disease     Objective:    Today's Vitals   There is no height or weight on file to calculate BMI.     02/08/2023   11:11 AM 09/26/2022   10:01 AM 02/02/2022    8:46 AM 09/26/2021   10:10 AM 11/29/2020    9:24 AM 09/24/2020   10:24 AM 11/26/2019    9:17 AM  Advanced Directives  Does Patient Have a Medical Advance Directive? Yes Yes Yes No Yes Yes Yes  Type of Estate agent of [employer] Power of [place];Living will Healthcare Power of [place];Living will  Healthcare Power of [place];Living will Living will;Healthcare Power of Attorney Living will;Healthcare Power of Attorney  Does patient want to make changes to medical advance directive?  No - Patient declined    No - Patient declined No - Patient declined  Copy of Healthcare Power of Attorney in Chart? Yes - validated most recent copy scanned in chart (See row information) No - copy requested No - copy requested  Yes - validated most recent copy scanned in chart (See row information)  Yes - validated most recent copy scanned in chart (See row information)  Would patient like information on creating a  medical advance directive?    No - Patient declined       Current Medications (verified) Outpatient Encounter Medications as of 02/08/2023  Medication Sig   ALPRAZolam (XANAX) 0.5 MG tablet Take 0.5 mg by mouth See admin instructions. Take 1/2 tablet by mouth 2 times daily (morning and 3PM) , and 1 tablet at bedtime.   citalopram (CELEXA) 40 MG tablet Take 1 tablet (40 mg total) by mouth daily.   ELIQUIS 5 MG TABS tablet TAKE 1 TABLET BY MOUTH TWICE A DAY   levothyroxine (SYNTHROID) 50 MCG tablet TAKE 1 TABLET BY MOUTH EVERY DAY   omeprazole (PRILOSEC) 20 MG capsule TAKE 1 CAPSULE BY MOUTH TWICE A DAY   simvastatin (ZOCOR) 40 MG tablet TAKE 1 TABLET BY MOUTH EVERYDAY AT BEDTIME   Facility-Administered Encounter Medications as of 02/08/2023  Medication   betamethasone acetate-betamethasone sodium phosphate (CELESTONE) injection 3 mg    Allergies (verified) Patient has no known allergies.   History: Past Medical History:  Diagnosis Date   Depression    Hyperlipidemia    Migraines    Rectal bleeding 05/26/2020   Squamous cell carcinoma 09/20/2017   chest   Past Surgical History:  Procedure Laterality Date   DILATION AND CURETTAGE OF UTERUS     x2   laser vein surgery     TONSILLECTOMY     Family History  Problem Relation Age of Onset   Coronary artery disease Father    Stroke Father    Parkinson's disease  Father    Hypertension Mother    Diabetes Mother    Stroke Mother    Coronary artery disease Brother    Factor V Leiden deficiency Daughter    Heart disease Paternal Grandfather    Colon cancer Neg Hx    Esophageal cancer Neg Hx    Pancreatic cancer Neg Hx    Stomach cancer Neg Hx    Rectal cancer Neg Hx    Social History   Socioeconomic History   Marital status: Divorced    Spouse name: Not on file   Number of children: 2   Years of education: college   Highest education level: Not on file  Occupational History   Occupation: Magazine features editor: [employer]     Comment: [employer]  Tobacco Use   Smoking status: Never   Smokeless tobacco: Never  Vaping Use   Vaping Use: Never used  Substance and Sexual Activity   Alcohol use: Yes    Alcohol/week: 4.0 standard drinks of alcohol    Types: 4 Glasses of wine per week    Comment: 4 glass week   Drug use: No   Sexual activity: Never  Other Topics Concern   Not on file  Social History Narrative   Divorced, both children live locally.   Patient works part time at [employer]. College education.   Left handed.   Caffeine- two cups coffee daily.   Social Determinants of Health   Financial Resource Strain: Low Risk  (02/08/2023)   Overall Financial Resource Strain (CARDIA)    Difficulty of Paying Living Expenses: Not hard at all  Food Insecurity: No Food Insecurity (02/08/2023)   Hunger Vital Sign    Worried About Running Out of Food in the Last Year: Never true    Ran Out of Food in the Last Year: Never true  Transportation Needs: No Transportation Needs (02/08/2023)   PRAPARE - Administrator, Civil Service (Medical): No    Lack of Transportation (Non-Medical): No  Physical Activity: Insufficiently Active (02/08/2023)   Exercise Vital Sign    Days of Exercise per Week: 3 days    Minutes of Exercise per Session: 20 min  Stress: No Stress Concern Present (02/08/2023)   [employer] of Occupational Health - Occupational Stress Questionnaire    Feeling of Stress : Not at all  Social Connections: Moderately Integrated (02/08/2023)   Social Connection and Isolation Panel [NHANES]    Frequency of Communication with Friends and Family: Three times a week    Frequency of Social Gatherings with Friends and Family: Three times a week    Attends Religious Services: More than 4 times per year    Active Member of Clubs or Organizations: Yes    Attends Engineer, structural: More than 4 times per year    Marital Status: Divorced    Tobacco Counseling Counseling given:  Not Answered   Clinical Intake:  Pre-visit preparation completed: No  Pain : No/denies pain     Nutritional Risks: None Diabetes: No  How often do you need to have someone help you when you read instructions, pamphlets, or other written materials from your doctor or pharmacy?: 1 - Never  Interpreter Needed?: No  Information entered by :: Remi Haggard LPN   Activities of Daily Living    02/08/2023   11:08 AM 02/08/2023   11:07 AM  In your present state of health, do you have any difficulty performing the following activities:  Hearing? 0  0  Vision? 0 0  Difficulty concentrating or making decisions? 0 0  Walking or climbing stairs? 0 0  Dressing or bathing? 0 0  Doing errands, shopping? 0 0  Preparing Food and eating ? N N  Using the Toilet? N N  In the past six months, have you accidently leaked urine? N N  Do you have problems with loss of bowel control? N N  Managing your Medications? N N  Managing your Finances? N N  Housekeeping or managing your Housekeeping? N N    Patient Care Team: Sheliah Hatch, MD as PCP - General Charna Elizabeth, MD as Consulting Physician (Gastroenterology) Levert Feinstein, MD as Consulting Physician (Neurology) Marcelle Overlie, MD as Consulting Physician (Obstetrics and Gynecology) [facility] Maris Berger, MD as Consulting Physician (Ophthalmology) Cheral Bay (Dentistry) Zannie Kehr (Dermatology) Arthur Holms, MD (Inactive) as Consulting Physician (Hematology) Mack Hook, MD as Consulting Physician (Orthopedic Surgery)  Indicate any recent Medical Services you may have received from other than [facility] providers in the past year (date may be approximate).     Assessment:   This is a routine wellness examination for Betrice.  Hearing/Vision screen Hearing Screening - Comments:: Slight hearing loss Come back in two years No hearing aids Vision Screening - Comments:: [facility] Up to date  Dietary  issues and exercise activities discussed:     Goals Addressed             This Visit's Progress    Weight (lb) < 200 lb (90.7 kg)         Depression Screen    02/08/2023   11:13 AM 12/11/2022    8:37 AM 06/07/2022    8:20 AM 02/02/2022    8:47 AM 02/02/2022    8:45 AM 12/07/2021    7:55 AM 06/02/2021    8:27 AM  PHQ 2/9 Scores  PHQ - 2 Score 0 0 0 0 0 0 0  PHQ- 9 Score 0 2    2 0    Fall Risk    02/08/2023   11:09 AM 02/08/2023   11:05 AM 02/04/2023    3:38 PM 12/11/2022    8:38 AM 06/07/2022    8:20 AM  Fall Risk   Falls in the past year? 0 0 0 1 0  Number falls in past yr:  0 0 0 0  Injury with Fall?  0 0 0 0  Risk for fall due to :    History of fall(s) No Fall Risks  Follow up  Falls evaluation completed;Education provided;Falls prevention discussed  Falls evaluation completed Falls evaluation completed    MEDICARE RISK AT HOME:  Medicare Risk at Home - 02/08/23 1105     Any stairs in or around the home? No    If so, are there any without handrails? No    Home free of loose throw rugs in walkways, pet beds, electrical cords, etc? Yes    Adequate lighting in your home to reduce risk of falls? Yes    Life alert? No    Use of a cane, walker or w/c? No    Grab bars in the bathroom? No    Shower chair or bench in shower? Yes    Elevated toilet seat or a handicapped toilet? No             TIMED UP AND GO:  Was the test performed?  No    Cognitive Function:    10/16/2018  9:26 AM 10/10/2017   10:26 AM  MMSE - Mini Mental State Exam  Orientation to time 5 5  Orientation to Place 5 5  Registration 3 3  Attention/ Calculation 5 5  Recall 3 3  Language- name 2 objects 2 2  Language- repeat 1 1  Language- follow 3 step command 3 3  Language- read & follow direction 1 1  Write a sentence 1 1  Copy design 1 1  Total score 30 30        02/08/2023   11:09 AM 11/26/2019    9:19 AM  6CIT Screen  What Year? 0 points 0 points  What month? 0 points 0  points  What time? 0 points 0 points  Count back from 20 0 points 0 points  Months in reverse 0 points 0 points  Repeat phrase 0 points 0 points  Total Score 0 points 0 points    Immunizations Immunization History  Administered Date(s) Administered   COVID-19, mRNA, vaccine(Comirnaty)12 years and older 06/01/2022   Fluad Quad(high Dose 65+) 06/01/2022   H1N1 09/30/2008   Influenza Split 06/28/2011   Influenza Whole 05/06/2010   Influenza, High Dose Seasonal PF 05/14/2020   Influenza,inj,Quad PF,6+ Mos 07/08/2013, 07/03/2014, 06/05/2016, 04/13/2017, 04/08/2018, 04/15/2019   Influenza-Unspecified 06/22/2015, 05/27/2021, 05/21/2022   PFIZER Comirnaty(Gray Top)Covid-19 Tri-Sucrose Vaccine 01/03/2021   PFIZER(Purple Top)SARS-COV-2 Vaccination 09/27/2019, 10/22/2019, 05/24/2020   Pfizer Covid-19 Vaccine Bivalent Booster 55yrs & up 05/27/2021, 06/01/2022   Pneumococcal Conjugate-13 04/01/2015   Pneumococcal Polysaccharide-23 04/03/2016   Td 05/22/2005   Tdap 06/28/2011   Zoster, Live 01/14/2014    TDAP status: Due, Education has been provided regarding the importance of this vaccine. Advised may receive this vaccine at local pharmacy or Health Dept. Aware to provide a copy of the vaccination record if obtained from local pharmacy or Health Dept. Verbalized acceptance and understanding.  Flu Vaccine status: Up to date  Pneumococcal vaccine status: Up to date  Covid-19 vaccine status: Information provided on how to obtain vaccines.   Qualifies for Shingles Vaccine? Yes   Zostavax completed No   Shingrix Completed?: No.    Education has been provided regarding the importance of this vaccine. Patient has been advised to call insurance company to determine out of pocket expense if they have not yet received this vaccine. Advised may also receive vaccine at local pharmacy or Health Dept. Verbalized acceptance and understanding.  Screening Tests Health Maintenance  Topic Date Due    COVID-19 Vaccine (7 - 2023-24 season) 07/27/2022   MAMMOGRAM  06/08/2023 (Originally 05/16/2022)   INFLUENZA VACCINE  03/22/2023   Medicare Annual Wellness (AWV)  02/08/2024   Colonoscopy  08/04/2027   Pneumonia Vaccine 36+ Years old  Completed   DEXA SCAN  Completed   Hepatitis C Screening  Completed   HPV VACCINES  Aged Out   DTaP/Tdap/Td  Discontinued   Zoster Vaccines- Shingrix  Discontinued    Health Maintenance  Health Maintenance Due  Topic Date Due   COVID-19 Vaccine (7 - 2023-24 season) 07/27/2022    Colorectal cancer screening: Type of screening: Colonoscopy. Completed 2021. Repeat every 7 years  Mammogram status: Completed 2023. Repeat every year  Bone Density status: Completed 2020. Results reflect: Bone density results: OSTEOPENIA. Repeat every 3-5 years.  Lung Cancer Screening: (Low Dose CT Chest recommended if Age 73-80 years, 20 pack-year currently smoking OR have quit w/in 15years.) does not qualify.   Lung Cancer Screening Referral:   Additional Screening:  Hepatitis C Screening:  does not qualify; Completed 2020  Vision Screening: Recommended annual ophthalmology exams for early detection of glaucoma and other disorders of the eye. Is the patient up to date with their annual eye exam?  Yes  Who is the provider or what is the name of the office in which the patient attends annual eye exams? [facility] If pt is not established with a provider, would they like to be referred to a provider to establish care? No .   Dental Screening: Recommended annual dental exams for proper oral hygiene   Community Resource Referral / Chronic Care Management: CRR required this visit?  No   CCM required this visit?  No     Plan:     I have personally reviewed and noted the following in the patient's chart:   Medical and social history Use of alcohol, tobacco or illicit drugs  Current medications and supplements including opioid prescriptions. Patient is not currently  taking opioid prescriptions. Functional ability and status Nutritional status Physical activity Advanced directives List of other physicians Hospitalizations, surgeries, and ER visits in previous 12 months Vitals Screenings to include cognitive, depression, and falls Referrals and appointments  In addition, I have reviewed and discussed with patient certain preventive protocols, quality metrics, and best practice recommendations. A written personalized care plan for preventive services as well as general preventive health recommendations were provided to patient.     Remi Haggard, LPN   1/61/0960   After Visit Summary: (MyChart) Due to this being a telephonic visit, the after visit summary with patients personalized plan was offered to patient via MyChart   Nurse Notes:

## 2023-02-09 DIAGNOSIS — F3342 Major depressive disorder, recurrent, in full remission: Secondary | ICD-10-CM | POA: Diagnosis not present

## 2023-02-19 ENCOUNTER — Ambulatory Visit: Payer: Medicare PPO | Attending: Cardiology | Admitting: Cardiology

## 2023-02-19 ENCOUNTER — Encounter: Payer: Self-pay | Admitting: Cardiology

## 2023-02-19 VITALS — BP 110/68 | HR 77 | Ht 63.75 in | Wt 167.0 lb

## 2023-02-19 DIAGNOSIS — E162 Hypoglycemia, unspecified: Secondary | ICD-10-CM

## 2023-02-19 DIAGNOSIS — Z86711 Personal history of pulmonary embolism: Secondary | ICD-10-CM

## 2023-02-19 DIAGNOSIS — R0982 Postnasal drip: Secondary | ICD-10-CM

## 2023-02-19 DIAGNOSIS — G43009 Migraine without aura, not intractable, without status migrainosus: Secondary | ICD-10-CM | POA: Diagnosis not present

## 2023-02-19 DIAGNOSIS — K625 Hemorrhage of anus and rectum: Secondary | ICD-10-CM

## 2023-02-19 DIAGNOSIS — J209 Acute bronchitis, unspecified: Secondary | ICD-10-CM

## 2023-02-19 DIAGNOSIS — R197 Diarrhea, unspecified: Secondary | ICD-10-CM

## 2023-02-19 DIAGNOSIS — M25539 Pain in unspecified wrist: Secondary | ICD-10-CM

## 2023-02-19 DIAGNOSIS — K219 Gastro-esophageal reflux disease without esophagitis: Secondary | ICD-10-CM

## 2023-02-19 DIAGNOSIS — E038 Other specified hypothyroidism: Secondary | ICD-10-CM

## 2023-02-19 DIAGNOSIS — G43001 Migraine without aura, not intractable, with status migrainosus: Secondary | ICD-10-CM

## 2023-02-19 DIAGNOSIS — H6123 Impacted cerumen, bilateral: Secondary | ICD-10-CM | POA: Diagnosis not present

## 2023-02-19 DIAGNOSIS — D6852 Prothrombin gene mutation: Secondary | ICD-10-CM

## 2023-02-19 DIAGNOSIS — J0100 Acute maxillary sinusitis, unspecified: Secondary | ICD-10-CM

## 2023-02-19 DIAGNOSIS — Z0000 Encounter for general adult medical examination without abnormal findings: Secondary | ICD-10-CM

## 2023-02-19 DIAGNOSIS — I2699 Other pulmonary embolism without acute cor pulmonale: Secondary | ICD-10-CM | POA: Diagnosis not present

## 2023-02-19 DIAGNOSIS — R072 Precordial pain: Secondary | ICD-10-CM

## 2023-02-19 DIAGNOSIS — U071 COVID-19: Secondary | ICD-10-CM

## 2023-02-19 DIAGNOSIS — R400 Somnolence: Secondary | ICD-10-CM

## 2023-02-19 DIAGNOSIS — S93609A Unspecified sprain of unspecified foot, initial encounter: Secondary | ICD-10-CM

## 2023-02-19 DIAGNOSIS — E785 Hyperlipidemia, unspecified: Secondary | ICD-10-CM

## 2023-02-19 DIAGNOSIS — H6122 Impacted cerumen, left ear: Secondary | ICD-10-CM

## 2023-02-19 DIAGNOSIS — H903 Sensorineural hearing loss, bilateral: Secondary | ICD-10-CM

## 2023-02-19 DIAGNOSIS — M79669 Pain in unspecified lower leg: Secondary | ICD-10-CM | POA: Diagnosis not present

## 2023-02-19 DIAGNOSIS — R42 Dizziness and giddiness: Secondary | ICD-10-CM

## 2023-02-19 DIAGNOSIS — F32A Depression, unspecified: Secondary | ICD-10-CM

## 2023-02-19 DIAGNOSIS — M5481 Occipital neuralgia: Secondary | ICD-10-CM

## 2023-02-19 DIAGNOSIS — R0789 Other chest pain: Secondary | ICD-10-CM | POA: Insufficient documentation

## 2023-02-19 DIAGNOSIS — E663 Overweight: Secondary | ICD-10-CM

## 2023-02-19 DIAGNOSIS — M25569 Pain in unspecified knee: Secondary | ICD-10-CM

## 2023-02-19 DIAGNOSIS — R0609 Other forms of dyspnea: Secondary | ICD-10-CM | POA: Insufficient documentation

## 2023-02-19 DIAGNOSIS — D6851 Activated protein C resistance: Secondary | ICD-10-CM

## 2023-02-19 MED ORDER — METOPROLOL TARTRATE 100 MG PO TABS: ORAL_TABLET | ORAL | 0 refills | Status: DC

## 2023-02-19 NOTE — Progress Notes (Signed)
Cardiology Consultation:    Date:  02/19/2023   ID:  Amy Perkins, DOB 19-May-1951, MRN [id number]  PCP:  Sheliah Hatch, MD  Cardiologist:  Gypsy Balsam, MD   Referring MD: Sheliah Hatch, MD   Chief Complaint  Patient presents with   Chest Pain    Want heart eval due to family h/o heart disease     History of Present Illness:    Amy Perkins is a 72 y.o. female who is being seen today for the evaluation of atypical chest pain, at the request of Sheliah Hatch, MD. past medical history significant for factor V Leiden mutation, history of pulmonary emboli, essential hypertension, dyslipidemia, dyspnea on exertion.  She was sent to me to check her heart she is worried because multiple family members get premature coronary artery disease.  Overall she seems to be doing well she does have a dog walk with dog on the regular basis gets short of breath tired quite easily but denies have any typical chest pain tightness squeezing pressure burning chest.  About a year ago she was experiencing chest pain on and off but that usually happen when she was sitting or laying down.  Denies have any swelling of lower extremities no palpitations no dizziness no passing out.  Past Medical History:  Diagnosis Date   Depression    Hyperlipidemia    Migraines    Rectal bleeding 05/26/2020   Squamous cell carcinoma 09/20/2017   chest    Past Surgical History:  Procedure Laterality Date   DILATION AND CURETTAGE OF UTERUS     x2   KNEE ARTHROSCOPY Left    laser vein surgery     Left hand surgery Left    Thumb   TONSILLECTOMY      Current Medications: Current Meds  Medication Sig   ALPRAZolam (XANAX) 0.5 MG tablet Take 0.5 mg by mouth See admin instructions. Take 1/2 tablet by mouth 2 times daily (morning and 3PM) , and 1 tablet at bedtime.   citalopram (CELEXA) 40 MG tablet Take 1 tablet (40 mg total) by mouth daily.   ELIQUIS 5 MG TABS tablet TAKE 1 TABLET BY MOUTH TWICE  A DAY (Patient taking differently: Take 5 mg by mouth 2 (two) times daily.)   levothyroxine (SYNTHROID) 50 MCG tablet TAKE 1 TABLET BY MOUTH EVERY DAY (Patient taking differently: Take 50 mcg by mouth daily before breakfast.)   omeprazole (PRILOSEC) 20 MG capsule TAKE 1 CAPSULE BY MOUTH TWICE A DAY (Patient taking differently: Take 20 mg by mouth 2 (two) times daily before a meal.)   simvastatin (ZOCOR) 40 MG tablet TAKE 1 TABLET BY MOUTH EVERYDAY AT BEDTIME (Patient taking differently: Take 40 mg by mouth daily at 6 PM.)   Current Facility-Administered Medications for the 02/19/23 encounter (Office Visit) with Georgeanna Lea, MD  Medication   betamethasone acetate-betamethasone sodium phosphate (CELESTONE) injection 3 mg     Allergies:   Patient has no known allergies.   Social History   Socioeconomic History   Marital status: Divorced    Spouse name: Not on file   Number of children: 2   Years of education: college   Highest education level: Not on file  Occupational History   Occupation: Magazine features editor: [employer]    Comment: [employer]  Tobacco Use   Smoking status: Never   Smokeless tobacco: Never  Vaping Use   Vaping Use: Never used  Substance and Sexual Activity  Alcohol use: Yes    Alcohol/week: 4.0 standard drinks of alcohol    Types: 4 Glasses of wine per week    Comment: 4 glass week   Drug use: No   Sexual activity: Never  Other Topics Concern   Not on file  Social History Narrative   Divorced, both children live locally.   Patient works part time at [employer]. College education.   Left handed.   Caffeine- two cups coffee daily.   Social Determinants of Health   Financial Resource Strain: Low Risk  (02/08/2023)   Overall Financial Resource Strain (CARDIA)    Difficulty of Paying Living Expenses: Not hard at all  Food Insecurity: No Food Insecurity (02/08/2023)   Hunger Vital Sign    Worried About Running Out of Food in the Last Year: Never  true    Ran Out of Food in the Last Year: Never true  Transportation Needs: No Transportation Needs (02/08/2023)   PRAPARE - Administrator, Civil Service (Medical): No    Lack of Transportation (Non-Medical): No  Physical Activity: Insufficiently Active (02/08/2023)   Exercise Vital Sign    Days of Exercise per Week: 3 days    Minutes of Exercise per Session: 20 min  Stress: No Stress Concern Present (02/08/2023)   [employer] of Occupational Health - Occupational Stress Questionnaire    Feeling of Stress : Not at all  Social Connections: Moderately Integrated (02/08/2023)   Social Connection and Isolation Panel [NHANES]    Frequency of Communication with Friends and Family: Three times a week    Frequency of Social Gatherings with Friends and Family: Three times a week    Attends Religious Services: More than 4 times per year    Active Member of Clubs or Organizations: Yes    Attends Engineer, structural: More than 4 times per year    Marital Status: Divorced     Family History: The patient's family history includes Coronary artery disease in her brother and father; Diabetes in her mother; Factor V Leiden deficiency in her daughter; Heart disease in her paternal grandfather; Hypertension in her mother; Parkinson's disease in her father; Stroke in her father and mother. There is no history of Colon cancer, Esophageal cancer, Pancreatic cancer, Stomach cancer, or Rectal cancer. ROS:   Please see the history of present illness.    All 14 point review of systems negative except as described per history of present illness.  EKGs/Labs/Other Studies Reviewed:    The following studies were reviewed today:   EKG:       Recent Labs: 12/11/2022: ALT 27; BUN 17; Creatinine, Ser 0.76; Hemoglobin 15.6; Platelets 222.0; Potassium 4.2; Sodium 138; TSH 2.21  Recent Lipid Panel    Component Value Date/Time   CHOL 181 12/11/2022 0917   TRIG 157.0 (H) 12/11/2022 0917    HDL 44.30 12/11/2022 0917   CHOLHDL 4 12/11/2022 0917   VLDL 31.4 12/11/2022 0917   LDLCALC 106 (H) 12/11/2022 0917    Physical Exam:    VS:  BP 110/68 (BP Location: Left Arm, Patient Position: Sitting)   Pulse 77   Ht 5' 3.75" (1.619 m)   Wt 167 lb (75.8 kg)   SpO2 94%   BMI 28.89 kg/m     Wt Readings from Last 3 Encounters:  02/19/23 167 lb (75.8 kg)  12/11/22 162 lb 4 oz (73.6 kg)  09/26/22 162 lb (73.5 kg)     GEN:  Well nourished, well developed  in no acute distress HEENT: Normal NECK: No JVD; No carotid bruits LYMPHATICS: No lymphadenopathy CARDIAC: RRR, no murmurs, no rubs, no gallops RESPIRATORY:  Clear to auscultation without rales, wheezing or rhonchi  ABDOMEN: Soft, non-tender, non-distended MUSCULOSKELETAL:  No edema; No deformity  SKIN: Warm and dry NEUROLOGIC:  Alert and oriented x 3 PSYCHIATRIC:  Normal affect   ASSESSMENT:    1. Acute pulmonary embolism without acute cor pulmonale, unspecified pulmonary embolism type (HCC)   2. Migraine without aura and with status migrainosus, not intractable   3. Migraine without aura and without status migrainosus, not intractable   4. Rectal bleeding   5. Gastroesophageal reflux disease, unspecified whether esophagitis present   6. Acute bronchitis, unspecified organism   7. Acute non-recurrent maxillary sinusitis   8. Bilateral impacted cerumen   9. Pain of lower leg, unspecified laterality   10. Postnasal drip   11. Arthralgia of lower leg, unspecified laterality   12. Prothrombin gene mutation (HCC)   13. General medical examination   14. Hyperlipidemia, unspecified hyperlipidemia type   15. Factor 5 Leiden mutation, heterozygous (HCC)   16. Sprain of foot, unspecified laterality, initial encounter   17. Dizziness   18. Somnolence, daytime   19. Arthralgia of wrist, unspecified laterality   20. Diarrhea, unspecified type   21. Atypical chest pain   22. History of pulmonary embolism    PLAN:    In  order of problems listed above:  History of pulmonary emboli.  Stable problem, she recovered almost completely still a bit short of breath, therefore, I will get echocardiogram on her to assess left ventricle ejection fraction and pulmonary pressure. Atypical chest pain with some fatigue shortness of breath multiple risk factors for coronary artery disease.  Will schedule her to have coronary CT angio to rule out significant ischemia. Dyslipidemia I did review her K PN which show me LDL 106 HDL 44.  Will continue present management. We did talk about healthy lifestyle she already walks with her dog on the regular basis   Medication Adjustments/Labs and Tests Ordered: Current medicines are reviewed at length with the patient today.  Concerns regarding medicines are outlined above.  Orders Placed This Encounter  Procedures   EKG 12-Lead   No orders of the defined types were placed in this encounter.   Signed, Georgeanna Lea, MD, [facility]. 02/19/2023 3:19 PM    [facility]

## 2023-02-19 NOTE — Patient Instructions (Signed)
Medication Instructions:   TAKE: Metoprolol 100mg  1 tablet 2 hours prior to CT Scan   Lab Work: 3rd Floor [address] BMP- today If you have labs (blood work) drawn today and your tests are completely normal, you will receive your results only by: MyChart Message (if you have MyChart) OR A paper copy in the mail If you have any lab test that is abnormal or we need to change your treatment, we will call you to review the results.   Testing/Procedures: Your physician has requested that you have an echocardiogram. Echocardiography is a painless test that uses sound waves to create images of your heart. It provides your doctor with information about the size and shape of your heart and how well your heart's chambers and valves are working. This procedure takes approximately one hour. There are no restrictions for this procedure. Please do NOT wear cologne, perfume, aftershave, or lotions (deodorant is allowed). Please arrive 15 minutes prior to your appointment time.    Your cardiac CT will be scheduled at one of the below locations:   [facility] [address] [place], [place] [address] [phone]   If scheduled at [facility], please arrive at the [facility] En[facility] (Entrance C2) of [facility] 30 minutes prior to test start time. You can use the FREE valet parking offered at entrance C (encouraged to control the heart rate for the test)  Proceed to the [facility] Radiology Department (first floor) to check-in and test prep.  All radiology patients and guests should use entrance C2 at [facility], accessed from [facility], even though the hospital's physical address listed is [address]      Please follow these instructions carefully (unless otherwise directed):    On the Night Before the Test: Be sure to Drink plenty of water. Do not consume any caffeinated/decaffeinated beverages or chocolate 12  hours prior to your test. Do not take any antihistamines 12 hours prior to your test.  On the Day of the Test: Drink plenty of water until 1 hour prior to the test. Do not eat any food 4 hours prior to the test. You may take your regular medications prior to the test.  Take metoprolol (Lopressor) two hours prior to test. FEMALES- please wear underwire-free bra if available, avoid dresses & tight clothing       After the Test: Drink plenty of water. After receiving IV contrast, you may experience a mild flushed feeling. This is normal. On occasion, you may experience a mild rash up to 24 hours after the test. This is not dangerous. If this occurs, you can take Benadryl 25 mg and increase your fluid intake. If you experience trouble breathing, this can be serious. If it is severe call 911 IMMEDIATELY. If it is mild, please call our office. If you take any of these medications: Glipizide/Metformin, Avandament, Glucavance, please do not take 48 hours after completing test unless otherwise instructed.  We will call to schedule your test 2-4 weeks out understanding that some insurance companies will need an authorization prior to the service being performed.   For non-scheduling related questions, please contact the cardiac imaging nurse navigator should you have any questions/concerns: Rockwell Alexandria, Cardiac Imaging Nurse Navigator Larey Brick, Cardiac Imaging Nurse Navigator [facility] Direct Office Dial: [phone]   For scheduling needs, including cancellations and rescheduling, please call [place], [phone].     Follow-Up: At [facility],  you and your health needs are our priority.  As part of our continuing mission to provide you with exceptional heart care, we have created designated Provider Care Teams.  These Care Teams include your primary Cardiologist (physician) and Advanced Practice Providers (APPs -  Physician Assistants and Nurse  Practitioners) who all work together to provide you with the care you need, when you need it.  We recommend signing up for the patient portal called "MyChart".  Sign up information is provided on this After Visit Summary.  MyChart is used to connect with patients for Virtual Visits (Telemedicine).  Patients are able to view lab/test results, encounter notes, upcoming appointments, etc.  Non-urgent messages can be sent to your provider as well.   To learn more about what you can do with MyChart, go to [email or web address].    Your next appointment:   3 month(s)  The format for your next appointment:   In Person  Provider:   Gypsy Balsam, MD    Other Instructions NA

## 2023-02-19 NOTE — Addendum Note (Signed)
Addended by: Baldo Ash D on: 02/19/2023 03:30 PM   Modules accepted: Orders

## 2023-02-20 DIAGNOSIS — R072 Precordial pain: Secondary | ICD-10-CM | POA: Diagnosis not present

## 2023-02-21 DIAGNOSIS — L578 Other skin changes due to chronic exposure to nonionizing radiation: Secondary | ICD-10-CM | POA: Diagnosis not present

## 2023-02-21 LAB — BASIC METABOLIC PANEL
BUN/Creatinine Ratio: 23 (ref 12–28)
BUN: 15 mg/dL (ref 8–27)
CO2: 25 mmol/L (ref 20–29)
Calcium: 9.3 mg/dL (ref 8.7–10.3)
Chloride: 101 mmol/L (ref 96–106)
Creatinine, Ser: 0.66 mg/dL (ref 0.57–1.00)
Glucose: 97 mg/dL (ref 70–99)
Potassium: 4.2 mmol/L (ref 3.5–5.2)
Sodium: 140 mmol/L (ref 134–144)
eGFR: 93 mL/min/{1.73_m2} (ref >59)

## 2023-02-23 ENCOUNTER — Ambulatory Visit: Payer: Medicare PPO | Admitting: Family Medicine

## 2023-02-23 VITALS — BP 122/68 | HR 84 | Temp 99.2°F | Ht 63.75 in | Wt 166.7 lb

## 2023-02-23 DIAGNOSIS — J029 Acute pharyngitis, unspecified: Secondary | ICD-10-CM | POA: Diagnosis not present

## 2023-02-23 DIAGNOSIS — J302 Other seasonal allergic rhinitis: Secondary | ICD-10-CM

## 2023-02-23 DIAGNOSIS — J0100 Acute maxillary sinusitis, unspecified: Secondary | ICD-10-CM

## 2023-02-23 DIAGNOSIS — R5383 Other fatigue: Secondary | ICD-10-CM

## 2023-02-23 LAB — POC COVID19 BINAXNOW: SARS Coronavirus 2 Ag: NEGATIVE

## 2023-02-23 LAB — POCT INFLUENZA A/B
Influenza A, POC: NEGATIVE
Influenza B, POC: NEGATIVE

## 2023-02-23 LAB — POCT RAPID STREP A (OFFICE): Rapid Strep A Screen: NEGATIVE

## 2023-02-23 MED ORDER — AMOXICILLIN-POT CLAVULANATE 875-125 MG PO TABS
1.00 | ORAL_TABLET | Freq: Two times a day (BID) | ORAL | 0 refills | Status: AC
Start: 2023-02-23 — End: 2023-03-05

## 2023-02-23 NOTE — Patient Instructions (Addendum)
-  It was a pleasure to care for you today.  -Negative rapid test for influenza, covid, and strep throat.  -START Augmentin 875-125mg  tablet, 1 tablet twice a day for 10 days for sinus infection. -Recommend over the counter  Zyrtec 10mg  tablet, 1 tablet at night time and over the counter allergy eye drops.  -Rest, stay hydrated.  -Alternate Tylenol 1000mg  and Ibuprofen 600-800mg  every 4 hours for pain, headache, and body aches. Recommend to eat something when taking Ibuprofen.   -Follow up if not improved or symptoms become worse.

## 2023-02-23 NOTE — Progress Notes (Signed)
Acute Office Visit   Subjective:  Patient ID: Amy Perkins, female    DOB: [birth year], 72 y.o.   MRN: [id number]  Chief Complaint  Patient presents with   Fatigue    Pt started last weekend with hoarseness and fatigue, developed a sore throat, drainage from the eyes, congestion     HPI Patient is a 72 year old female that presents with hoarseness, fatigue, sore throat, eye drainage in both eyes, frontal headache, blurry vision since eye drainage started, itchy eyes, non productive cough, and nasal/chest congestion.  Eye drainage various between clear and white. Some thickness when she has white drainage. Eye drainage started yesterday morning.   Denies chest pain, SHOB, dizziness, ear pain/drainage, fever, chills, or sweats.    All other symptoms started last weekend.  Not been around anyone that she is aware has been ill.   She reports she has took OTC Tylenol with headache, but not much other symptoms.    ROS See HPI above      Objective:   BP 122/68   Pulse 84   Temp 99.2 F (37.3 C) (Temporal)   Ht 5' 3.75" (1.619 m)   Wt 166 lb 11.2 oz (75.6 kg)   SpO2 96%   BMI 28.84 kg/m    Physical Exam Vitals reviewed.  Constitutional:      General: She is not in acute distress.    Appearance: Normal appearance. She is not ill-appearing, toxic-appearing or diaphoretic.  HENT:     Head: Normocephalic and atraumatic.     Right Ear: Tympanic membrane, ear canal and external ear normal.     Left Ear: Tympanic membrane, ear canal and external ear normal.     Nose: Congestion present.     Right Sinus: Maxillary sinus tenderness present. No frontal sinus tenderness.     Left Sinus: Maxillary sinus tenderness present. No frontal sinus tenderness.     Mouth/Throat:     Pharynx: Oropharynx is clear. Uvula midline. No pharyngeal swelling, oropharyngeal exudate, posterior oropharyngeal erythema or uvula swelling.  Eyes:     General:        Right eye: Discharge present.         Left eye: Discharge present.    Conjunctiva/sclera: Conjunctivae normal.     Comments: Watery; red sclera in both eyes   Cardiovascular:     Rate and Rhythm: Normal rate and regular rhythm.     Heart sounds: Normal heart sounds. No murmur heard.    No friction rub. No gallop.  Pulmonary:     Effort: Pulmonary effort is normal. No respiratory distress.     Breath sounds: Normal breath sounds.     Comments: Non productive during visit  Musculoskeletal:        General: Normal range of motion.  Lymphadenopathy:     Head:     Right side of head: No submental or submandibular adenopathy.     Left side of head: No submental or submandibular adenopathy.  Skin:    General: Skin is warm and dry.  Neurological:     General: No focal deficit present.     Mental Status: She is alert and oriented to person, place, and time. Mental status is at baseline.  Psychiatric:        Mood and Affect: Mood normal.        Behavior: Behavior normal.        Thought Content: Thought content normal.        Judgment:  Judgment normal.    Results for orders placed or performed in visit on 02/23/23  POC COVID-19  Result Value Ref Range   SARS Coronavirus 2 Ag Negative Negative  POCT Influenza A/B  Result Value Ref Range   Influenza A, POC Negative Negative   Influenza B, POC Negative Negative  POCT rapid strep A  Result Value Ref Range   Rapid Strep A Screen Negative Negative       Assessment & Plan:  Fatigue, unspecified type -     POC COVID-19 BinaxNow -     POCT Influenza A/B  Sore throat -     POCT rapid strep A   -Negative rapid test for influenza, covid, and strep throat.  -Prescribed Augmentin 875-125mg  tablet, 1 tablet twice a day for 10 days for sinus infection. -Recommend over the counter  Zyrtec 10mg  tablet, 1 tablet at night time and over the counter allergy eye drops for eye irration and drainage, most likely from allergies related to sinus infection.  -Rest, stay hydrated.   -Alternate Tylenol 1000mg  and Ibuprofen 600-800mg  every 4 hours for pain, headache, and body aches. Recommend to eat something when taking Ibuprofen.   -Follow up if not improved or symptoms become worse.  -Patient expressed understanding and in agreement with plan.   Zandra Abts, NP

## 2023-02-27 ENCOUNTER — Other Ambulatory Visit: Payer: Self-pay | Admitting: Hematology & Oncology

## 2023-03-01 ENCOUNTER — Ambulatory Visit (HOSPITAL_COMMUNITY): Payer: Medicare PPO

## 2023-03-09 DIAGNOSIS — H5213 Myopia, bilateral: Secondary | ICD-10-CM | POA: Diagnosis not present

## 2023-03-09 DIAGNOSIS — H2513 Age-related nuclear cataract, bilateral: Secondary | ICD-10-CM | POA: Diagnosis not present

## 2023-03-12 ENCOUNTER — Telehealth: Payer: Self-pay | Admitting: Family Medicine

## 2023-03-12 NOTE — Telephone Encounter (Signed)
Pt was seen 02/23/2023. Please advise

## 2023-03-12 NOTE — Telephone Encounter (Signed)
Pt seen Amy Perkins July 5 for a sinus infection she has finished her antibiotic . She states she is still having a headache frequently , cough is still lingering , and congestion . She is wondering is this normal or should she be seen again ? States she feels better than before . I advised pt that she may need an apt but will send the message to Dr Beverely Low

## 2023-03-12 NOTE — Telephone Encounter (Signed)
Patient was seen with Zandra Abts, NP on 7.5.24 for sinus issues. Patient was given antibiotics. Patient states she feels better just still having some lingering symptoms but not as bad as they were. She has a few antibiotics left but is wondering if this is normal to still have lingering symptoms, please advise

## 2023-03-12 NOTE — Telephone Encounter (Signed)
Please make sure she is taking a daily antihistamine like Claritin or Zyrtec to see if headache and congestion improve.  I expect the cough to linger and that will disappear w/ time.  But if the headaches and congestion fail to improve on the allergy medication or worsen- I would like to see her in the office

## 2023-03-12 NOTE — Telephone Encounter (Signed)
I left the detailed message on Pt VM

## 2023-03-15 ENCOUNTER — Ambulatory Visit (HOSPITAL_COMMUNITY)
Admission: RE | Admit: 2023-03-15 | Discharge: 2023-03-15 | Disposition: A | Discharge status: Home / Self Care | Payer: Medicare PPO | Source: Ambulatory Visit | Attending: Cardiology | Admitting: Cardiology

## 2023-03-15 ENCOUNTER — Ambulatory Visit (HOSPITAL_COMMUNITY)
Admission: RE | Admit: 2023-03-15 | Discharge: 2023-03-15 | Disposition: A | Discharge status: Home / Self Care | Payer: Medicare PPO | Source: Ambulatory Visit

## 2023-03-15 DIAGNOSIS — R072 Precordial pain: Secondary | ICD-10-CM

## 2023-03-15 DIAGNOSIS — I700 Atherosclerosis of aorta: Secondary | ICD-10-CM | POA: Diagnosis not present

## 2023-03-15 LAB — ECHOCARDIOGRAM COMPLETE
AR max vel: 2.73 cm2
AV Area VTI: 2.69 cm2
AV Area mean vel: 2.47 cm2
AV Mean grad: 4 mmHg
AV Peak grad: 6.8 mmHg
AV Vena cont: 0.6 cm
Ao pk vel: 1.3 m/s
Area-P 1/2: 2.83 cm2
S' Lateral: 2.4 cm

## 2023-03-15 MED ORDER — IOHEXOL 350 MG/ML SOLN
190.0000 mL | Freq: Once | INTRAVENOUS | Status: AC | PRN
  Administered 2023-03-15: 190 mL via INTRAVENOUS

## 2023-03-15 MED ORDER — NITROGLYCERIN 0.4 MG SL SUBL
SUBLINGUAL_TABLET | SUBLINGUAL | Status: AC
  Filled 2023-03-15: qty 2

## 2023-03-15 MED ORDER — NITROGLYCERIN 0.4 MG SL SUBL
0.8000 mg | SUBLINGUAL_TABLET | SUBLINGUAL | Status: DC | PRN
  Administered 2023-03-15: 0.8 mg via SUBLINGUAL

## 2023-03-15 NOTE — Progress Notes (Signed)
*  PRELIMINARY RESULTS* Echocardiogram 2D Echocardiogram has been performed.  Amy Perkins 03/15/2023, 4:36 PM

## 2023-03-16 ENCOUNTER — Telehealth: Payer: Self-pay

## 2023-03-16 NOTE — Telephone Encounter (Signed)
Left message on My Chart with normal results per Dr. Krasowski's note 

## 2023-03-26 DIAGNOSIS — Z09 Encounter for follow-up examination after completed treatment for conditions other than malignant neoplasm: Secondary | ICD-10-CM | POA: Diagnosis not present

## 2023-03-26 DIAGNOSIS — Z85820 Personal history of malignant melanoma of skin: Secondary | ICD-10-CM | POA: Diagnosis not present

## 2023-03-26 DIAGNOSIS — D225 Melanocytic nevi of trunk: Secondary | ICD-10-CM | POA: Diagnosis not present

## 2023-03-26 DIAGNOSIS — L578 Other skin changes due to chronic exposure to nonionizing radiation: Secondary | ICD-10-CM | POA: Diagnosis not present

## 2023-03-26 DIAGNOSIS — Z08 Encounter for follow-up examination after completed treatment for malignant neoplasm: Secondary | ICD-10-CM | POA: Diagnosis not present

## 2023-03-26 DIAGNOSIS — L821 Other seborrheic keratosis: Secondary | ICD-10-CM | POA: Diagnosis not present

## 2023-03-26 DIAGNOSIS — L814 Other melanin hyperpigmentation: Secondary | ICD-10-CM | POA: Diagnosis not present

## 2023-03-29 ENCOUNTER — Telehealth: Payer: Self-pay

## 2023-03-29 NOTE — Telephone Encounter (Signed)
CT Results reviewed with pt as per Dr. Vanetta Shawl note. Pt will follow up with PCP and Pulmonologist regarding lung issues. Pt verbalized understanding and had no additional questions. Routed to PCP

## 2023-03-30 ENCOUNTER — Ambulatory Visit: Payer: Medicare PPO | Admitting: Pulmonary Disease

## 2023-03-30 ENCOUNTER — Encounter: Payer: Self-pay | Admitting: Pulmonary Disease

## 2023-03-30 VITALS — BP 116/70 | HR 70 | Temp 97.6°F | Ht 63.5 in | Wt 167.2 lb

## 2023-03-30 DIAGNOSIS — R9389 Abnormal findings on diagnostic imaging of other specified body structures: Secondary | ICD-10-CM | POA: Diagnosis not present

## 2023-03-30 MED ORDER — BUDESONIDE-FORMOTEROL FUMARATE 160-4.5 MCG/ACT IN AERO: 2.0000 | INHALATION_SPRAY | Freq: Two times a day (BID) | RESPIRATORY_TRACT | 12 refills | Status: DC

## 2023-03-30 NOTE — Patient Instructions (Signed)
Nice to see you again  I ordered a CT scan of your chest to further evaluate the groundglass, I am not worried about this the pattern is not too concerning but lets if we get better information  Try Symbicort 2 puffs twice a day every day and rinse her mouth out with water after every use  See if this helps with the chest tightness  Return to clinic in 6 to 8 weeks after CT scan

## 2023-03-30 NOTE — Progress Notes (Signed)
@Patient  ID: Amy Perkins, female    DOB: [birth year], 72 y.o.   MRN: [id number]  Chief Complaint  Patient presents with   Follow-up    States cardiology requested her follow up for CT chest    Referring provider: Sheliah Hatch, MD  HPI:   72 y.o. woman whom we are seeing in consultation for evaluation of chest discomfort/pain.  Most recent cardiology note reviewed.  Most recent PCP note x 2 reviewed.  Lost to follow-up.  Requested 63-month follow-up 09/2021.  Never followed up.  Now presenting 18 months later.  Was prescribed Symbicort for chest discomfort, tightness possibly asthma.  Seems like this helped some.  She is been out of this obviously for many months.  Recent CT scan of the coronaries by cardiology revealed persistent bilateral lower lobe confluent groundglass opacities unclear etiology.  HPI at initial visit: Patient reports nearly 1 year history of chest discomfort or pain.  The description of this pain is certainly atypical.  No clear pattern or reliably reproducible symptoms.  Sometimes with exertion Sometimes Not.  Sometimes Pleuritic, Sometimes Not.  Sometimes Occurs at Rest.  Sometimes Worse When She Bends over.  She Has a History of PE in 2019.  She Had a CTA PE Protocol October 2022 and on My Review Interpretation Shows No PE, No Parenchymal Abnormalities to Account for Her Discomfort, No Evidence of Serositis or Pleural Effusion.  Review of systems she does endorse some radiation of pain to her mid or lumbar back.  The back pain can also occur independently of the presence of chest pain.  She is unsure if palpation of the area makes things better or worse that she has not tried this nor have other providers when evaluating the pain.  Symptoms are not improved despite escalation in PPI therapy.  GI note reviewed, feel unlikely that current symptoms are related to the gastrointestinal tract or reflux.  She recently started a methylprednisolone taper for Planter  fasciitis.  This is greatly improved her chest pain.  She denies any atopic symptoms, seasonal allergies, cough.  No history of asthma in the past.  PMH: Hypothyroid, GERD Surgical history: Tonsillectomy Family history: Father with CAD, CVA, mother with hypertension, diabetes, CVA Social history: Never smoker, lives in [place] / Pulmonary Flowsheets:   ACT:      No data to display          MMRC:     No data to display          Epworth:      No data to display          Tests:   FENO:  No results found for: "NITRICOXIDE"  PFT:     No data to display          WALK:      No data to display          Imaging: Personally reviewed and as per EMR discussion this note CT CORONARY MORPH W/CTA COR W/SCORE W/CA W/CM &/OR WO/CM  Addendum Date: 03/24/2023   ADDENDUM REPORT: 03/24/2023 23:37 EXAM: OVER-READ INTERPRETATION  CT CHEST The following report is an over-read performed by radiologist Dr. Narda Rutherford of [facility] on 03/24/2023. This over-read does not include interpretation of cardiac or coronary anatomy or pathology. The coronary CTA interpretation by the cardiologist is attached. COMPARISON:  Chest CTA 06/15/2021 FINDINGS: Vascular: No aortic atherosclerosis. The included aorta is normal in caliber. Mediastinum/nodes: Calcified mediastinal lymph nodes  typical of prior granulomatous disease. Unremarkable esophagus. Lungs: Chronic but worsening ground-glass within the bilateral lower lobes and lingula. No pulmonary nodule. No pleural fluid. The included airways are patent. Upper abdomen: No acute or unexpected findings. Musculoskeletal: There are no acute or suspicious osseous abnormalities. IMPRESSION: Chronic but worsening ground-glass within the bilateral lower lobes and lingula since 2022 exam. This is nonspecific. Consider follow-up CT in 3-6 months. Electronically Signed   By: Narda Rutherford M.D.   On: 03/24/2023 23:37    Result Date: 03/24/2023 HISTORY: Chest pain, nonspecific EXAM: Cardiac/Coronary CT TECHNIQUE: The patient was scanned on a [employer]. PROTOCOL: A 120 kV prospective scan was triggered in the descending thoracic aorta at 111 HU's. Axial non-contrast 3 mm slices were carried out through the heart. The data set was analyzed on a dedicated work station and scored using the Agatston method. Gantry rotation speed was 250 msecs and collimation was 0.6 mm. Heart rate was optimized medically and sl NTG was given. The 3D data set was reconstructed in 5% intervals of the 35-75 % of the R-R cycle. Systolic and diastolic phases were analyzed on a dedicated work station using MPR, MIP and VRT modes. The patient received OMNIPAQUE IOHEXOL 350 MG/ML SOLN of contrast. FINDINGS: Coronary calcium score: The patient's coronary artery calcium score is 0, which places the patient in the 0 percentile. Coronary arteries: Normal coronary origins.  Right dominance. Right Coronary Artery: Normal caliber vessel, gives rise to PDA. No significant plaque or stenosis. Focal step artifact at ostial vessel in most phases, but able to be assessed in 40% of the R-R interval and no significant stenosis seen. Left Main Coronary Artery: Normal caliber vessel. No significant plaque or stenosis. Left Anterior Descending Coronary Artery: Normal caliber vessel. No significant plaque or stenosis. Gives rise to two small diagonal branches. Focal step artifact in mid vessel. Left Circumflex Artery: Normal caliber vessel. No significant plaque or stenosis. Gives rise to one OM branch. Focal step artifact in mid vessel. Aorta: Normal size, 31 mm at the mid ascending aorta (level of the PA bifurcation) measured double oblique. Scattered aortic atherosclerosis. No dissection seen in visualized portions of the aorta. Aortic Valve: Trivial calcifications. Trileaflet. Other findings: Normal pulmonary vein drainage into the left atrium. Normal  left atrial appendage without a thrombus. Normal size of the pulmonary artery. Normal appearance of the pericardium. Unable to assess plaque volume due to step artifact. IMPRESSION: 1. No evidence of CAD, CADRADS = 0. Limited assessment in focal areas as noted above due to step artifact. 2. Coronary calcium score of 0. This was 0 percentile for age-, sex-, and race- matched controls. 3. Normal coronary origin with right dominance. 4. Scattered aortic atherosclerosis INTERPRETATION: CAD-RADS 0: No evidence of CAD (0%). Consider non-atherosclerotic causes of chest pain. Electronically Signed: By: Jodelle Red M.D. On: 03/19/2023 07:15   ECHOCARDIOGRAM COMPLETE  Result Date: 03/15/2023    ECHOCARDIOGRAM REPORT   Patient Name:   LENNY KINGRY Date of Exam: 03/15/2023 Medical Rec #:  [id number]       Height:       63.7 in Accession #:    [id number]      Weight:       166.7 lb Date of Birth:  February 12, 1951       BSA:          1.805 m Patient Age:    72 years        BP:  100/55 mmHg Patient Gender: F               HR:           58 bpm. Exam Location:  Outpatient Procedure: 2D Echo, Cardiac Doppler and Color Doppler Indications:    Dyspnea  History:        Patient has prior history of Echocardiogram examinations, most                 recent 07/19/2018. Signs/Symptoms:Dyspnea; Risk                 Factors:Dyslipidemia. History of pulmonary embolism.  Sonographer:    Dondra Prader RVT Referring Phys: [phone] ROBERT J KRASOWSKI IMPRESSIONS  1. Left ventricular ejection fraction, by estimation, is 70 to 75%. The left ventricle has hyperdynamic function. The left ventricle has no regional wall motion abnormalities. Left ventricular diastolic parameters are consistent with Grade I diastolic dysfunction (impaired relaxation).  2. Right ventricular systolic function is normal. The right ventricular size is normal.  3. The mitral valve is normal in structure. Mild mitral valve regurgitation.  4. The aortic valve is  tricuspid. There is mild calcification of the aortic valve. There is mild thickening of the aortic valve. Aortic valve regurgitation is mild. Aortic valve sclerosis/calcification is present, without any evidence of aortic stenosis.  5. The inferior vena cava is normal in size with greater than 50% respiratory variability, suggesting right atrial pressure of 3 mmHg. Comparison(s): No significant change from prior study. FINDINGS  Left Ventricle: Left ventricular ejection fraction, by estimation, is 70 to 75%. The left ventricle has hyperdynamic function. The left ventricle has no regional wall motion abnormalities. The left ventricular internal cavity size was normal in size. There is no left ventricular hypertrophy. Left ventricular diastolic parameters are consistent with Grade I diastolic dysfunction (impaired relaxation). Right Ventricle: The right ventricular size is normal. No increase in right ventricular wall thickness. Right ventricular systolic function is normal. Left Atrium: Left atrial size was normal in size. Right Atrium: Right atrial size was normal in size. Pericardium: There is no evidence of pericardial effusion. Mitral Valve: The mitral valve is normal in structure. Mild mitral valve regurgitation. Tricuspid Valve: The tricuspid valve is normal in structure. Tricuspid valve regurgitation is trivial. Aortic Valve: The aortic valve is tricuspid. There is mild calcification of the aortic valve. There is mild thickening of the aortic valve. Aortic valve regurgitation is mild. Aortic valve sclerosis/calcification is present, without any evidence of aortic stenosis. Aortic valve mean gradient measures 4.0 mmHg. Aortic valve peak gradient measures 6.8 mmHg. Aortic valve area, by VTI measures 2.69 cm. Pulmonic Valve: The pulmonic valve was not well visualized. Pulmonic valve regurgitation is mild. Aorta: The aortic root and ascending aorta are structurally normal, with no evidence of dilitation. Venous:  The inferior vena cava is normal in size with greater than 50% respiratory variability, suggesting right atrial pressure of 3 mmHg. IAS/Shunts: The atrial septum is grossly normal.  LEFT VENTRICLE PLAX 2D LVIDd:         3.80 cm   Diastology LVIDs:         2.40 cm   LV e' medial:    6.85 cm/s LV PW:         1.00 cm   LV E/e' medial:  10.0 LV IVS:        0.80 cm   LV e' lateral:   9.25 cm/s LVOT diam:     1.90 cm  LV E/e' lateral: 7.4 LV SV:         87 LV SV Index:   48 LVOT Area:     2.84 cm  RIGHT VENTRICLE             IVC RV S prime:     11.40 cm/s  IVC diam: 1.50 cm TAPSE (M-mode): 2.6 cm LEFT ATRIUM             Index        RIGHT ATRIUM           Index LA diam:        3.70 cm 2.05 cm/m   RA Area:     12.80 cm LA Vol (A2C):   47.9 ml 26.54 ml/m  RA Volume:   29.90 ml  16.57 ml/m LA Vol (A4C):   37.4 ml 20.72 ml/m LA Biplane Vol: 42.0 ml 23.27 ml/m  AORTIC VALVE                    PULMONIC VALVE AV Area (Vmax):    2.73 cm     PV Vmax:          0.93 m/s AV Area (Vmean):   2.47 cm     PV Peak grad:     3.5 mmHg AV Area (VTI):     2.69 cm     PR End Diast Vel: 4.75 msec AV Vmax:           130.00 cm/s AV Vmean:          92.000 cm/s AV VTI:            0.323 m AV Peak Grad:      6.8 mmHg AV Mean Grad:      4.0 mmHg LVOT Vmax:         125.00 cm/s LVOT Vmean:        80.200 cm/s LVOT VTI:          0.306 m LVOT/AV VTI ratio: 0.95 AR Vena Contracta: 0.60 cm  AORTA Ao Root diam: 2.90 cm Ao Asc diam:  3.40 cm MITRAL VALVE MV Area (PHT): 2.83 cm    SHUNTS MV Decel Time: 268 msec    Systemic VTI:  0.31 m MV E velocity: 68.80 cm/s  Systemic Diam: 1.90 cm MV A velocity: 74.80 cm/s MV E/A ratio:  0.92 Amy Flatten MD Electronically signed by Amy Flatten MD Signature Date/Time: 03/15/2023/8:20:32 PM    Final     Lab Results: Personally reviewed CBC    Component Value Date/Time   WBC 6.6 12/11/2022 0917   RBC 4.96 12/11/2022 0917   HGB 15.6 (H) 12/11/2022 0917   HGB 14.8 09/26/2022 0935   HCT 44.6  12/11/2022 0917   PLT 222.0 12/11/2022 0917   PLT 209 09/26/2022 0935   PLT 203 02/08/2010 0000   MCV 90.0 12/11/2022 0917   MCH 30.7 09/26/2022 0935   MCHC 34.9 12/11/2022 0917   RDW 13.0 12/11/2022 0917   LYMPHSABS 2.0 12/11/2022 0917   MONOABS 0.5 12/11/2022 0917   EOSABS 0.2 12/11/2022 0917   BASOSABS 0.0 12/11/2022 0917    BMET    Component Value Date/Time   NA 140 02/20/2023 1434   K 4.2 02/20/2023 1434   CL 101 02/20/2023 1434   CO2 25 02/20/2023 1434   GLUCOSE 97 02/20/2023 1434   GLUCOSE 108 (H) 12/11/2022 0917   GLUCOSE 56 02/08/2010 0000   BUN 15 02/20/2023 1434   CREATININE  0.66 02/20/2023 1434   CREATININE 0.73 09/26/2022 0935   CALCIUM 9.3 02/20/2023 1434   GFRNONAA >60 09/26/2022 0935   GFRAA >60 09/26/2019 1101    BNP No results found for: "BNP"  ProBNP No results found for: "PROBNP"  Specialty Problems       Pulmonary Problems   Dyspnea on exertion    No Known Allergies  Immunization History  Administered Date(s) Administered   COVID-19, mRNA, vaccine(Comirnaty)12 years and older 06/01/2022   Fluad Quad(high Dose 65+) 06/01/2022   H1N1 09/30/2008   Influenza Split 06/28/2011   Influenza Whole 05/06/2010   Influenza, High Dose Seasonal PF 05/14/2020   Influenza,inj,Quad PF,6+ Mos 07/08/2013, 07/03/2014, 06/05/2016, 04/13/2017, 04/08/2018, 04/15/2019   Influenza-Unspecified 06/22/2015, 05/27/2021, 05/21/2022   PFIZER Comirnaty(Gray Top)Covid-19 Tri-Sucrose Vaccine 01/03/2021   PFIZER(Purple Top)SARS-COV-2 Vaccination 09/27/2019, 10/22/2019, 05/24/2020   Pfizer Covid-19 Vaccine Bivalent Booster 77yrs & up 05/27/2021, 06/01/2022   Pneumococcal Conjugate-13 04/01/2015   Pneumococcal Polysaccharide-23 04/03/2016   Td 05/22/2005   Tdap 06/28/2011   Zoster, Live 01/14/2014    Past Medical History:  Diagnosis Date   Depression    Hyperlipidemia    Migraines    Rectal bleeding 05/26/2020   Squamous cell carcinoma 09/20/2017   chest     Tobacco History: Social History   Tobacco Use  Smoking Status Never  Smokeless Tobacco Never   Counseling given: Not Answered   Continue to not smoke  Outpatient Encounter Medications as of 03/30/2023  Medication Sig   ALPRAZolam (XANAX) 0.5 MG tablet Take 0.5 mg by mouth See admin instructions. Take 1/2 tablet by mouth 2 times daily (morning and 3PM) , and 1 tablet at bedtime.   budesonide-formoterol (SYMBICORT) 160-4.5 MCG/ACT inhaler Inhale 2 puffs into the lungs 2 (two) times daily.   citalopram (CELEXA) 40 MG tablet Take 1 tablet (40 mg total) by mouth daily.   ELIQUIS 5 MG TABS tablet TAKE 1 TABLET BY MOUTH TWICE A DAY   levothyroxine (SYNTHROID) 50 MCG tablet TAKE 1 TABLET BY MOUTH EVERY DAY (Patient taking differently: Take 50 mcg by mouth daily before breakfast.)   omeprazole (PRILOSEC) 20 MG capsule TAKE 1 CAPSULE BY MOUTH TWICE A DAY (Patient taking differently: Take 20 mg by mouth 2 (two) times daily before a meal.)   simvastatin (ZOCOR) 40 MG tablet TAKE 1 TABLET BY MOUTH EVERYDAY AT BEDTIME (Patient taking differently: Take 40 mg by mouth daily at 6 PM.)   [DISCONTINUED] metoprolol tartrate (LOPRESSOR) 100 MG tablet Take one tablet 2 hours before cardiac CT for heart greater than 55   Facility-Administered Encounter Medications as of 03/30/2023  Medication   betamethasone acetate-betamethasone sodium phosphate (CELESTONE) injection 3 mg     Review of Systems  Review of Systems  N/a Physical Exam  BP 116/70 (BP Location: Left Arm, Patient Position: Sitting, Cuff Size: Normal)   Pulse 70   Temp 97.6 F (36.4 C) (Oral)   Ht 5' 3.5" (1.613 m)   Wt 167 lb 3.2 oz (75.8 kg)   SpO2 98%   BMI 29.15 kg/m   Wt Readings from Last 5 Encounters:  03/30/23 167 lb 3.2 oz (75.8 kg)  02/23/23 166 lb 11.2 oz (75.6 kg)  02/19/23 167 lb (75.8 kg)  12/11/22 162 lb 4 oz (73.6 kg)  09/26/22 162 lb (73.5 kg)    BMI Readings from Last 5 Encounters:  03/30/23 29.15 kg/m   02/23/23 28.84 kg/m  02/19/23 28.89 kg/m  12/11/22 28.74 kg/m  09/26/22 28.70 kg/m  Physical Exam General: Well-appearing, no acute distress Eyes: EOMI, icterus Neck: Supple, no JVP Pulmonary: Clear, normal work of breathing, no chest pain with inspiration Cardiovascular: Regular rhythm, no murmur Abdomen: Nondistended, bowel sounds present MSK: Costochondral junction nontender to touch bilaterally, no synovitis, no joint effusion Neuro: Normal gait, no weakness Psych: Normal mood, full affect  Assessment & Plan:   Chest pain/discomfort/tightness: Present for many years at this time.  Atypical.  No consistent exacerbating factors.  Occasionally worse with exertion and occasionally worse with inspiration occasionally present at rest.  CT chest 05/2021 with ongoing symptoms demonstrates no intraparenchymal cause of symptoms, no evidence of thickened pleura or pleural effusion suspect pleurisy or serositis.  Is possible this is the case but again not always pleuritic in nature.  Previous improvement on methylprednisolone taper for her planter fasciitis.  No point tenderness on exam today.  Some improvement after symbicort. No longer has access to this. Symbicort refilled.  CT abnormality: bilateral lower lobe GGO confluent. Atelectasis vs inflammatory. CT hi res prone and supine ordered.    Return in about 8 weeks (around 05/25/2023) for f/u Dr. Judeth Horn, after CT scan.   Karren Burly, MD 03/30/2023

## 2023-04-11 ENCOUNTER — Ambulatory Visit (HOSPITAL_BASED_OUTPATIENT_CLINIC_OR_DEPARTMENT_OTHER)
Admission: RE | Admit: 2023-04-11 | Discharge: 2023-04-11 | Disposition: A | Discharge status: Home / Self Care | Payer: Medicare PPO | Source: Ambulatory Visit | Attending: Pulmonary Disease | Admitting: Pulmonary Disease

## 2023-04-11 DIAGNOSIS — K8020 Calculus of gallbladder without cholecystitis without obstruction: Secondary | ICD-10-CM | POA: Diagnosis not present

## 2023-04-11 DIAGNOSIS — I700 Atherosclerosis of aorta: Secondary | ICD-10-CM | POA: Insufficient documentation

## 2023-04-11 DIAGNOSIS — R9389 Abnormal findings on diagnostic imaging of other specified body structures: Secondary | ICD-10-CM | POA: Insufficient documentation

## 2023-04-11 DIAGNOSIS — R590 Localized enlarged lymph nodes: Secondary | ICD-10-CM | POA: Diagnosis not present

## 2023-04-11 DIAGNOSIS — R918 Other nonspecific abnormal finding of lung field: Secondary | ICD-10-CM | POA: Diagnosis not present

## 2023-04-11 DIAGNOSIS — J984 Other disorders of lung: Secondary | ICD-10-CM | POA: Diagnosis not present

## 2023-04-21 ENCOUNTER — Other Ambulatory Visit: Payer: Self-pay | Admitting: Family Medicine

## 2023-04-23 ENCOUNTER — Other Ambulatory Visit: Payer: Self-pay | Admitting: Hematology & Oncology

## 2023-04-23 ENCOUNTER — Other Ambulatory Visit: Payer: Self-pay | Admitting: Family Medicine

## 2023-05-08 ENCOUNTER — Encounter: Payer: Self-pay | Admitting: Cardiology

## 2023-05-08 ENCOUNTER — Ambulatory Visit: Payer: Medicare PPO | Attending: Cardiology | Admitting: Cardiology

## 2023-05-08 ENCOUNTER — Other Ambulatory Visit: Payer: Self-pay | Admitting: Family Medicine

## 2023-05-08 VITALS — BP 112/70 | HR 66 | Ht 63.25 in | Wt 168.0 lb

## 2023-05-08 DIAGNOSIS — Z86711 Personal history of pulmonary embolism: Secondary | ICD-10-CM | POA: Diagnosis not present

## 2023-05-08 DIAGNOSIS — E782 Mixed hyperlipidemia: Secondary | ICD-10-CM | POA: Diagnosis not present

## 2023-05-08 DIAGNOSIS — D6851 Activated protein C resistance: Secondary | ICD-10-CM | POA: Diagnosis not present

## 2023-05-08 DIAGNOSIS — R0789 Other chest pain: Secondary | ICD-10-CM

## 2023-05-08 DIAGNOSIS — E785 Hyperlipidemia, unspecified: Secondary | ICD-10-CM

## 2023-05-08 DIAGNOSIS — I2699 Other pulmonary embolism without acute cor pulmonale: Secondary | ICD-10-CM | POA: Diagnosis not present

## 2023-05-08 DIAGNOSIS — R0609 Other forms of dyspnea: Secondary | ICD-10-CM

## 2023-05-08 NOTE — Addendum Note (Signed)
Addended by: Baldo Ash D on: 05/08/2023 01:39 PM   Modules accepted: Orders

## 2023-05-08 NOTE — Patient Instructions (Addendum)
Medication Instructions:  Your physician recommends that you continue on your current medications as directed. Please refer to the Current Medication list given to you today.  *If you need a refill on your cardiac medications before your next appointment, please call your pharmacy*   Lab Work: 3rd Floor  [address] [place]- today If you have labs (blood work) drawn today and your tests are completely normal, you will receive your results only by: MyChart Message (if you have MyChart) OR A paper copy in the mail If you have any lab test that is abnormal or we need to change your treatment, we will call you to review the results.   Testing/Procedures: None Ordered   Follow-Up: At [facility], you and your health needs are our priority.  As part of our continuing mission to provide you with exceptional heart care, we have created designated Provider Care Teams.  These Care Teams include your primary Cardiologist (physician) and Advanced Practice Providers (APPs -  Physician Assistants and Nurse Practitioners) who all work together to provide you with the care you need, when you need it.  We recommend signing up for the patient portal called "MyChart".  Sign up information is provided on this After Visit Summary.  MyChart is used to connect with patients for Virtual Visits (Telemedicine).  Patients are able to view lab/test results, encounter notes, upcoming appointments, etc.  Non-urgent messages can be sent to your provider as well.   To learn more about what you can do with MyChart, go to [email or web address].    Your next appointment:   12 month(s)  The format for your next appointment:   In Person  Provider:   Gypsy Balsam, MD    Other Instructions NA

## 2023-05-08 NOTE — Progress Notes (Signed)
Cardiology Office Note:    Date:  05/08/2023   ID:  Amy Perkins, DOB 15-Feb-1951, MRN [id number]  PCP:  Sheliah Hatch, MD  Cardiologist:  Gypsy Balsam, MD    Referring MD: Sheliah Hatch, MD   Chief Complaint  Patient presents with   Results    History of Present Illness:    Amy Perkins is a 72 y.o. female with past medical history significant for factor V Leiden mutation, history of pulm emboli anticoagulated, essential hypertension, dyslipidemia.  She was sent to [place] because of shortness of breath as well as some atypical chest pain.  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  She is doing very well she walks on the regular basis she walks with her dog sometimes without the dog and she goes faster and she also goes to her walking group and they walk together actually today she will go with a walking group to exercise.  Past Medical History:  Diagnosis Date   Depression    Hyperlipidemia    Migraines    Rectal bleeding 05/26/2020   Squamous cell carcinoma 09/20/2017   chest    Past Surgical History:  Procedure Laterality Date   DILATION AND CURETTAGE OF UTERUS     x2   KNEE ARTHROSCOPY Left    laser vein surgery     Left hand surgery Left    Thumb   TONSILLECTOMY      Current Medications: Current Meds  Medication Sig   ALPRAZolam (XANAX) 0.5 MG tablet Take 0.5 mg by mouth See admin instructions. Take 1/2 tablet by mouth 2 times daily (morning and 3PM) , and 1 tablet at bedtime.   budesonide-formoterol (SYMBICORT) 160-4.5 MCG/ACT inhaler Inhale 2 puffs into the lungs 2 (two) times daily.   citalopram (CELEXA) 40 MG tablet Take 1 tablet (40 mg total) by mouth daily.   ELIQUIS 5 MG TABS tablet TAKE 1 TABLET BY MOUTH TWICE A DAY  (Patient taking differently: Take 5 mg by mouth 2 (two) times daily.)   levothyroxine (SYNTHROID) 50 MCG tablet TAKE 1 TABLET BY MOUTH EVERY DAY   omeprazole (PRILOSEC) 20 MG capsule TAKE 1 CAPSULE BY MOUTH TWICE A DAY (Patient taking differently: Take 20 mg by mouth 2 (two) times daily before a meal.)   simvastatin (ZOCOR) 40 MG tablet TAKE 1 TABLET BY MOUTH EVERYDAY AT BEDTIME (Patient taking differently: Take 40 mg by mouth daily at 6 PM.)   [DISCONTINUED] levothyroxine (SYNTHROID) 50 MCG tablet TAKE 1 TABLET BY MOUTH EVERY DAY (Patient taking differently: Take 50 mcg by mouth daily before breakfast.)   Current Facility-Administered Medications for the 05/08/23 encounter (Office Visit) with Georgeanna Lea, MD  Medication   betamethasone acetate-betamethasone sodium phosphate (CELESTONE) injection 3 mg     Allergies:   Patient has no known allergies.   Social History   Socioeconomic History   Marital status: Divorced    Spouse name: Not on file   Number of children: 2   Years of education: college   Highest education level: Not on file  Occupational History   Occupation: Magazine features editor: [employer]    Comment: [employer]  Tobacco Use   Smoking status: Never   Smokeless tobacco: Never  Vaping Use   Vaping status: Never Used  Substance and  Sexual Activity   Alcohol use: Yes    Alcohol/week: 4.0 standard drinks of alcohol    Types: 4 Glasses of wine per week    Comment: 4 glass week   Drug use: No   Sexual activity: Never  Other Topics Concern   Not on file  Social History Narrative   Divorced, both children live locally.   Patient works part time at [employer]. College education.   Left handed.   Caffeine- two cups coffee daily.   Social Determinants of Health   Financial Resource Strain: Low Risk  (02/08/2023)   Overall Financial Resource Strain (CARDIA)    Difficulty of Paying Living Expenses: Not hard at all  Food Insecurity: No Food Insecurity  (02/08/2023)   Hunger Vital Sign    Worried About Running Out of Food in the Last Year: Never true    Ran Out of Food in the Last Year: Never true  Transportation Needs: No Transportation Needs (02/08/2023)   PRAPARE - Administrator, Civil Service (Medical): No    Lack of Transportation (Non-Medical): No  Physical Activity: Insufficiently Active (02/08/2023)   Exercise Vital Sign    Days of Exercise per Week: 3 days    Minutes of Exercise per Session: 20 min  Stress: No Stress Concern Present (02/08/2023)   [employer] of Occupational Health - Occupational Stress Questionnaire    Feeling of Stress : Not at all  Social Connections: Moderately Integrated (02/08/2023)   Social Connection and Isolation Panel [NHANES]    Frequency of Communication with Friends and Family: Three times a week    Frequency of Social Gatherings with Friends and Family: Three times a week    Attends Religious Services: More than 4 times per year    Active Member of Clubs or Organizations: Yes    Attends Engineer, structural: More than 4 times per year    Marital Status: Divorced     Family History: The patient's family history includes Coronary artery disease in her brother and father; Diabetes in her mother; Factor V Leiden deficiency in her daughter; Heart disease in her paternal grandfather; Hypertension in her mother; Parkinson's disease in her father; Stroke in her father and mother. There is no history of Colon cancer, Esophageal cancer, Pancreatic cancer, Stomach cancer, or Rectal cancer. ROS:   Please see the history of present illness.    All 14 point review of systems negative except as described per history of present illness  EKGs/Labs/Other Studies Reviewed:    EKG Interpretation Date/Time:  Tuesday May 08 2023 13:17:15 EDT Ventricular Rate:  66 PR Interval:  208 QRS Duration:  98 QT Interval:  414 QTC Calculation: 434 R Axis:   -30  Text Interpretation: Normal  sinus rhythm Left axis deviation Cannot rule out Anterior infarct , age undetermined When compared with ECG of 19-Feb-2023 14:38, No significant change was found Confirmed by Gypsy Balsam [phone]) on 05/08/2023 1:18:15 PM    Recent Labs: 12/11/2022: ALT 27; Hemoglobin 15.6; Platelets 222.0; TSH 2.21 02/20/2023: BUN 15; Creatinine, Ser 0.66; Potassium 4.2; Sodium 140  Recent Lipid Panel    Component Value Date/Time   CHOL 181 12/11/2022 0917   TRIG 157.0 (H) 12/11/2022 0917   HDL 44.30 12/11/2022 0917   CHOLHDL 4 12/11/2022 0917   VLDL 31.4 12/11/2022 0917   LDLCALC 106 (H) 12/11/2022 0917    Physical Exam:    VS:  BP 112/70 (BP Location: Left Arm, Patient Position: Sitting)  Pulse 66   Ht 5' 3.25" (1.607 m)   Wt 168 lb (76.2 kg)   SpO2 95%   BMI 29.53 kg/m     Wt Readings from Last 3 Encounters:  05/08/23 168 lb (76.2 kg)  03/30/23 167 lb 3.2 oz (75.8 kg)  02/23/23 166 lb 11.2 oz (75.6 kg)     GEN:  Well nourished, well developed in no acute distress HEENT: Normal NECK: No JVD; No carotid bruits LYMPHATICS: No lymphadenopathy CARDIAC: RRR, no murmurs, no rubs, no gallops RESPIRATORY:  Clear to auscultation without rales, wheezing or rhonchi  ABDOMEN: Soft, non-tender, non-distended MUSCULOSKELETAL:  No edema; No deformity  SKIN: Warm and dry LOWER EXTREMITIES: no swelling NEUROLOGIC:  Alert and oriented x 3 PSYCHIATRIC:  Normal affect   ASSESSMENT:    1. Acute pulmonary embolism without acute cor pulmonale, unspecified pulmonary embolism type (HCC)   2. Atypical chest pain   3. Factor 5 Leiden mutation, heterozygous (HCC)   4. Dyspnea on exertion   5. Mixed hyperlipidemia   6. History of pulmonary embolism    PLAN:    In order of problems listed above:  History of pulmonary emboli.  She is anticoagulated factor V Leiden mutation.  Doing well asymptomatic. Atypical chest pain not cardiac calcium score 0 no coronary artery disease based on coronary CT angio  overall things are looking good.  His risk factors modifications. Dyslipidemia she takes simvastatin 40 mg daily with no issue with tolerance I did review K PN which show me her LDL 106 HDL 44 good cholesterol control for her clinical scenario her calcium score being 0.  I will ask to have her to have LP(a) checked make sure were not missing any risk that we have not checked so far. We did discuss exercise on the regular basis which she already does and encouraged her to continue   Medication Adjustments/Labs and Tests Ordered: Current medicines are reviewed at length with the patient today.  Concerns regarding medicines are outlined above.  Orders Placed This Encounter  Procedures   EKG 12-Lead   Medication changes: No orders of the defined types were placed in this encounter.   Signed, Georgeanna Lea, MD, [facility] 05/08/2023 1:35 PM    [facility]

## 2023-05-10 LAB — LIPOPROTEIN A (LPA): Lipoprotein (a): <8.4 nmol/L (ref <75.0)

## 2023-05-18 ENCOUNTER — Telehealth: Payer: Self-pay

## 2023-05-18 NOTE — Telephone Encounter (Signed)
Patient notified through my chart.

## 2023-05-18 NOTE — Telephone Encounter (Signed)
-----   Message from Gypsy Balsam sent at 05/11/2023 10:05 AM EDT ----- LP(a) normal

## 2023-05-24 ENCOUNTER — Ambulatory Visit: Payer: Medicare PPO | Admitting: Pulmonary Disease

## 2023-05-24 ENCOUNTER — Encounter: Payer: Self-pay | Admitting: Pulmonary Disease

## 2023-05-24 VITALS — BP 113/71 | HR 70 | Ht 63.0 in | Wt 166.8 lb

## 2023-05-24 DIAGNOSIS — R0789 Other chest pain: Secondary | ICD-10-CM

## 2023-05-24 NOTE — Patient Instructions (Signed)
Nice to see you again  I am glad the Symbicort is helping  Continue this, I refilled this today  I am okay for you and your primary care doctor to continue this in the future or if you guys prefer please let me know and I can arrange follow-up in the future to keep prescriptions active  Otherwise, return to clinic as needed

## 2023-05-24 NOTE — Progress Notes (Signed)
@Patient  ID: Amy Perkins, female    DOB: [birth year], 72 y.o.   MRN: [id number]  Chief Complaint  Patient presents with   Follow-up    F/u ct, pt is using symbicort and does feel like its helping     Referring provider: Sheliah Hatch, MD  HPI:   72 y.o. woman whom we are seeing in consultation for evaluation of chest discomfort/pain.  Most recent cardiology note reviewed.    Doing well.  Intermittent chest discomfort essentially resolved with Symbicort.  No cough.  No dyspnea.  No concerns.  She is pleased with this development.  Would like to continue Symbicort.  HPI at initial visit: Patient reports nearly 1 year history of chest discomfort or pain.  The description of this pain is certainly atypical.  No clear pattern or reliably reproducible symptoms.  Sometimes with exertion Sometimes Not.  Sometimes Pleuritic, Sometimes Not.  Sometimes Occurs at Rest.  Sometimes Worse When She Bends over.  She Has a History of PE in 2019.  She Had a CTA PE Protocol October 2022 and on My Review Interpretation Shows No PE, No Parenchymal Abnormalities to Account for Her Discomfort, No Evidence of Serositis or Pleural Effusion.  Review of systems she does endorse some radiation of pain to her mid or lumbar back.  The back pain can also occur independently of the presence of chest pain.  She is unsure if palpation of the area makes things better or worse that she has not tried this nor have other providers when evaluating the pain.  Symptoms are not improved despite escalation in PPI therapy.  GI note reviewed, feel unlikely that current symptoms are related to the gastrointestinal tract or reflux.  She recently started a methylprednisolone taper for Planter fasciitis.  This is greatly improved her chest pain.  She denies any atopic symptoms, seasonal allergies, cough.  No history of asthma in the past.  PMH: Hypothyroid, GERD Surgical history: Tonsillectomy Family history: Father with CAD, CVA,  mother with hypertension, diabetes, CVA Social history: Never smoker, lives in [place] / Pulmonary Flowsheets:   ACT:      No data to display          MMRC:     No data to display          Epworth:      No data to display          Tests:   FENO:  No results found for: "NITRICOXIDE"  PFT:     No data to display          WALK:      No data to display          Imaging: Personally reviewed and as per EMR discussion this note No results found.  Lab Results: Personally reviewed CBC    Component Value Date/Time   WBC 6.6 12/11/2022 0917   RBC 4.96 12/11/2022 0917   HGB 15.6 (H) 12/11/2022 0917   HGB 14.8 09/26/2022 0935   HCT 44.6 12/11/2022 0917   PLT 222.0 12/11/2022 0917   PLT 209 09/26/2022 0935   PLT 203 02/08/2010 0000   MCV 90.0 12/11/2022 0917   MCH 30.7 09/26/2022 0935   MCHC 34.9 12/11/2022 0917   RDW 13.0 12/11/2022 0917   LYMPHSABS 2.0 12/11/2022 0917   MONOABS 0.5 12/11/2022 0917   EOSABS 0.2 12/11/2022 0917   BASOSABS 0.0 12/11/2022 0917    BMET    Component Value Date/Time  NA 140 02/20/2023 1434   K 4.2 02/20/2023 1434   CL 101 02/20/2023 1434   CO2 25 02/20/2023 1434   GLUCOSE 97 02/20/2023 1434   GLUCOSE 108 (H) 12/11/2022 0917   GLUCOSE 56 02/08/2010 0000   BUN 15 02/20/2023 1434   CREATININE 0.66 02/20/2023 1434   CREATININE 0.73 09/26/2022 0935   CALCIUM 9.3 02/20/2023 1434   GFRNONAA >60 09/26/2022 0935   GFRAA >60 09/26/2019 1101    BNP No results found for: "BNP"  ProBNP No results found for: "PROBNP"  Specialty Problems       Pulmonary Problems   Dyspnea on exertion    No Known Allergies  Immunization History  Administered Date(s) Administered   Fluad Quad(high Dose 65+) 06/01/2022   H1N1 09/30/2008   Influenza Split 06/28/2011, 04/26/2023   Influenza Whole 05/06/2010   Influenza, High Dose Seasonal PF 05/14/2020   Influenza,inj,Quad PF,6+ Mos 07/08/2013,  07/03/2014, 06/05/2016, 04/13/2017, 04/08/2018, 04/15/2019   Influenza-Unspecified 06/22/2015, 05/27/2021, 05/21/2022   Moderna Covid-19 Vaccine Bivalent Booster 50yrs & up 04/27/2023   PFIZER Comirnaty(Gray Top)Covid-19 Tri-Sucrose Vaccine 01/03/2021   PFIZER(Purple Top)SARS-COV-2 Vaccination 09/27/2019, 10/22/2019, 05/24/2020   Pfizer Covid-19 Vaccine Bivalent Booster 67yrs & up 05/27/2021, 06/01/2022   Pfizer(Comirnaty)Fall Seasonal Vaccine 12 years and older 06/01/2022   Pneumococcal Conjugate-13 04/01/2015   Pneumococcal Polysaccharide-23 04/03/2016   Td 05/22/2005   Tdap 06/28/2011   Zoster, Live 01/14/2014    Past Medical History:  Diagnosis Date   Depression    Hyperlipidemia    Migraines    Rectal bleeding 05/26/2020   Squamous cell carcinoma 09/20/2017   chest    Tobacco History: Social History   Tobacco Use  Smoking Status Never  Smokeless Tobacco Never   Counseling given: Not Answered   Continue to not smoke  Outpatient Encounter Medications as of 05/24/2023  Medication Sig   ALPRAZolam (XANAX) 0.5 MG tablet Take 0.5 mg by mouth See admin instructions. Take 1/2 tablet by mouth 2 times daily (morning and 3PM) , and 1 tablet at bedtime.   budesonide-formoterol (SYMBICORT) 160-4.5 MCG/ACT inhaler Inhale 2 puffs into the lungs 2 (two) times daily.   citalopram (CELEXA) 40 MG tablet Take 1 tablet (40 mg total) by mouth daily.   ELIQUIS 5 MG TABS tablet TAKE 1 TABLET BY MOUTH TWICE A DAY (Patient taking differently: Take 5 mg by mouth 2 (two) times daily.)   levothyroxine (SYNTHROID) 50 MCG tablet TAKE 1 TABLET BY MOUTH EVERY DAY   omeprazole (PRILOSEC) 20 MG capsule TAKE 1 CAPSULE BY MOUTH TWICE A DAY (Patient taking differently: Take 20 mg by mouth 2 (two) times daily before a meal.)   simvastatin (ZOCOR) 40 MG tablet TAKE 1 TABLET BY MOUTH EVERYDAY AT BEDTIME (Patient taking differently: Take 40 mg by mouth daily at 6 PM.)   Facility-Administered Encounter  Medications as of 05/24/2023  Medication   betamethasone acetate-betamethasone sodium phosphate (CELESTONE) injection 3 mg     Review of Systems  Review of Systems  N/a Physical Exam  BP 113/71   Pulse 70   Ht 5\' 3"  (1.6 m)   Wt 166 lb 12.8 oz (75.7 kg)   SpO2 95%   BMI 29.55 kg/m   Wt Readings from Last 5 Encounters:  05/24/23 166 lb 12.8 oz (75.7 kg)  05/08/23 168 lb (76.2 kg)  03/30/23 167 lb 3.2 oz (75.8 kg)  02/23/23 166 lb 11.2 oz (75.6 kg)  02/19/23 167 lb (75.8 kg)    BMI Readings from Last  5 Encounters:  05/24/23 29.55 kg/m  05/08/23 29.53 kg/m  03/30/23 29.15 kg/m  02/23/23 28.84 kg/m  02/19/23 28.89 kg/m     Physical Exam General: Well-appearing, no acute distress Eyes: EOMI, icterus Neck: Supple, no JVP Pulmonary: Clear, normal work of breathing, no chest pain with inspiration Cardiovascular: Regular rhythm, no murmur Abdomen: Nondistended, bowel sounds present MSK: Costochondral junction nontender to touch bilaterally, no synovitis, no joint effusion Neuro: Normal gait, no weakness Psych: Normal mood, full affect  Assessment & Plan:   Chest pain/discomfort/tightness: Present for many years at this time.  Atypical.  No consistent exacerbating factors.  Occasionally worse with exertion and occasionally worse with inspiration occasionally present at rest.  CT chest 05/2021 with ongoing symptoms demonstrates no intraparenchymal cause of symptoms, no evidence of thickened pleura or pleural effusion suspect pleurisy or serositis.  Possible mild asthma triggered by COVID in the past.  Resolved with Symbicort.  Continue Symbicort.  Okay to continue prescription with PCP if preferred.  CT abnormality: bilateral lower lobe GGO confluent. Atelectasis vs inflammatory. CT hi res prone and supine demonstrates resolution of this on prone images suggesting atelectasis.  No further follow-up needed.   Return if symptoms worsen or fail to improve, for f/u Dr.  Judeth Horn.   Karren Burly, MD 05/24/2023

## 2023-06-12 ENCOUNTER — Ambulatory Visit: Payer: Medicare PPO | Admitting: Family Medicine

## 2023-06-12 ENCOUNTER — Encounter: Payer: Self-pay | Admitting: Family Medicine

## 2023-06-12 VITALS — BP 102/64 | HR 65 | Temp 98.7°F | Ht 63.0 in | Wt 157.5 lb

## 2023-06-12 DIAGNOSIS — E038 Other specified hypothyroidism: Secondary | ICD-10-CM

## 2023-06-12 DIAGNOSIS — E663 Overweight: Secondary | ICD-10-CM

## 2023-06-12 DIAGNOSIS — H109 Unspecified conjunctivitis: Secondary | ICD-10-CM | POA: Diagnosis not present

## 2023-06-12 DIAGNOSIS — E782 Mixed hyperlipidemia: Secondary | ICD-10-CM

## 2023-06-12 LAB — CBC WITH DIFFERENTIAL/PLATELET
Basophils Absolute: 0 10*3/uL (ref 0.0–0.1)
Basophils Relative: 0.8 % (ref 0.0–3.0)
Eosinophils Absolute: 0.2 10*3/uL (ref 0.0–0.7)
Eosinophils Relative: 2.6 % (ref 0.0–5.0)
HCT: 45.2 % (ref 36.0–46.0)
Hemoglobin: 15.1 g/dL — ABNORMAL HIGH (ref 12.0–15.0)
Lymphocytes Relative: 31.7 % (ref 12.0–46.0)
Lymphs Abs: 1.9 10*3/uL (ref 0.7–4.0)
MCHC: 33.4 g/dL (ref 30.0–36.0)
MCV: 90.1 fL (ref 78.0–100.0)
Monocytes Absolute: 0.4 10*3/uL (ref 0.1–1.0)
Monocytes Relative: 6.5 % (ref 3.0–12.0)
Neutro Abs: 3.5 10*3/uL (ref 1.4–7.7)
Neutrophils Relative %: 58.4 % (ref 43.0–77.0)
Platelets: 234 10*3/uL (ref 150.0–400.0)
RBC: 5.02 Mil/uL (ref 3.87–5.11)
RDW: 13.3 % (ref 11.5–15.5)
WBC: 6 10*3/uL (ref 4.0–10.5)

## 2023-06-12 LAB — LIPID PANEL
Cholesterol: 203 mg/dL — ABNORMAL HIGH (ref 0–200)
HDL: 48.9 mg/dL (ref >39.00)
LDL Cholesterol: 125 mg/dL — ABNORMAL HIGH (ref 0–99)
NonHDL: 153.72
Total CHOL/HDL Ratio: 4
Triglycerides: 146 mg/dL (ref 0.0–149.0)
VLDL: 29.2 mg/dL (ref 0.0–40.0)

## 2023-06-12 LAB — TSH: TSH: 2.48 u[IU]/mL (ref 0.35–5.50)

## 2023-06-12 LAB — BASIC METABOLIC PANEL
BUN: 18 mg/dL (ref 6–23)
CO2: 29 meq/L (ref 19–32)
Calcium: 9.9 mg/dL (ref 8.4–10.5)
Chloride: 102 meq/L (ref 96–112)
Creatinine, Ser: 0.73 mg/dL (ref 0.40–1.20)
GFR: 81.64 mL/min (ref >60.00)
Glucose, Bld: 117 mg/dL — ABNORMAL HIGH (ref 70–99)
Potassium: 4 meq/L (ref 3.5–5.1)
Sodium: 140 meq/L (ref 135–145)

## 2023-06-12 LAB — HEPATIC FUNCTION PANEL
ALT: 25 U/L (ref 0–35)
AST: 19 U/L (ref 0–37)
Albumin: 4.5 g/dL (ref 3.5–5.2)
Alkaline Phosphatase: 59 U/L (ref 39–117)
Bilirubin, Direct: 0.1 mg/dL (ref 0.0–0.3)
Total Bilirubin: 0.4 mg/dL (ref 0.2–1.2)
Total Protein: 6.9 g/dL (ref 6.0–8.3)

## 2023-06-12 MED ORDER — POLYMYXIN B-TRIMETHOPRIM 10000-0.1 UNIT/ML-% OP SOLN: 2.0000 [drp] | Freq: Three times a day (TID) | OPHTHALMIC | 0 refills | Status: DC

## 2023-06-12 NOTE — Assessment & Plan Note (Signed)
Chronic problem.  On Simvastatin '40mg'$  daily w/o difficulty.  Check labs.  Adjust meds prn

## 2023-06-12 NOTE — Progress Notes (Signed)
   Subjective:    Patient ID: Amy Perkins, female    DOB: 11-14-1950, 72 y.o.   MRN: [id number]  HPI Hyperlipidemia- chronic problem, on Simvastatin 40mg  daily.  No CP, SOB, abd pain, N/V.  Hypothyroid- chronic problem, on Levothyroxine daily.  No change in energy level.  No changes to skin/hair/nails.  Overweight- pt is down 5 lbs since CPE in April.  Pt is walking regularly and paying attention to her diet.  R eye redness- pt reports eye will frequently get red, feels gritty, and then resolves.  Denies excessive drainage   Review of Systems For ROS see HPI     Objective:   Physical Exam Vitals reviewed.  Constitutional:      General: She is not in acute distress.    Appearance: Normal appearance. She is well-developed. She is not ill-appearing.  HENT:     Head: Normocephalic and atraumatic.  Eyes:     Pupils: Pupils are equal, round, and reactive to light.     Comments: R eye w/ conjunctival injxn and some excess tearing  Neck:     Thyroid: No thyromegaly.  Cardiovascular:     Rate and Rhythm: Normal rate and regular rhythm.     Heart sounds: Normal heart sounds. No murmur heard. Pulmonary:     Effort: Pulmonary effort is normal. No respiratory distress.     Breath sounds: Normal breath sounds.  Abdominal:     General: There is no distension.     Palpations: Abdomen is soft.     Tenderness: There is no abdominal tenderness.  Musculoskeletal:     Cervical back: Normal range of motion and neck supple.  Lymphadenopathy:     Cervical: No cervical adenopathy.  Skin:    General: Skin is warm and dry.  Neurological:     Mental Status: She is alert and oriented to person, place, and time.  Psychiatric:        Behavior: Behavior normal.           Assessment & Plan:  Conjunctivitis- new.  Pt has recurrent redness of R eye.  Eye will feel gritty and tear, but no purulent drainage.  Start eye drops in case of bacterial conjunctivitis.  If no improvement, pt to  notify eye doctor.  Pt expressed understanding and is in agreement w/ plan.

## 2023-06-12 NOTE — Patient Instructions (Signed)
Schedule your complete physical in 6 months We'll notify you of your lab results and make any changes if needed Keep up the good work on healthy diet and regular exercise- you look great! Use the eye drops to see if that clears the redness in your R eye Call with any questions or concerns Stay Safe!  Stay Healthy! Happy Fall!!!

## 2023-06-12 NOTE — Assessment & Plan Note (Signed)
Chronic problem.  Currently on Levothyroxine 56mg daily and is asymptomatic.  Check labs.  Adjust meds prn

## 2023-06-12 NOTE — Assessment & Plan Note (Signed)
Improving.  Pt is down 5 lbs since last visit.  Applauded her efforts at regular walking and healthy diet.  Will continue to follow.

## 2023-06-13 ENCOUNTER — Telehealth: Payer: Self-pay

## 2023-06-13 ENCOUNTER — Other Ambulatory Visit: Payer: Self-pay

## 2023-06-13 DIAGNOSIS — R7309 Other abnormal glucose: Secondary | ICD-10-CM

## 2023-06-13 NOTE — Telephone Encounter (Signed)
Pt has been notified.

## 2023-06-13 NOTE — Telephone Encounter (Signed)
-----   Message from Neena Rhymes sent at 06/13/2023 11:40 AM EDT ----- Labs are mostly stable with the exception of elevated sugar.  Based on this, we are going to add an A1C to assess for possible diabetes (A1C, dx elevated glucose).  Continue to work on healthy diet and regular exercise.

## 2023-06-21 ENCOUNTER — Other Ambulatory Visit: Payer: Medicare PPO

## 2023-06-21 DIAGNOSIS — R7309 Other abnormal glucose: Secondary | ICD-10-CM

## 2023-06-21 NOTE — Telephone Encounter (Signed)
Faxed add on 06/20/2023

## 2023-06-21 NOTE — Telephone Encounter (Signed)
Patient called regarding A1c. I requested she come in for a lab visit, she states she was told she did not need to come in as they had enough from lab draw. Please advise

## 2023-06-21 NOTE — Telephone Encounter (Signed)
Faxed again on 06/21/2023

## 2023-06-26 ENCOUNTER — Other Ambulatory Visit: Payer: Self-pay | Admitting: Hematology & Oncology

## 2023-06-26 DIAGNOSIS — Z1231 Encounter for screening mammogram for malignant neoplasm of breast: Secondary | ICD-10-CM | POA: Diagnosis not present

## 2023-06-26 DIAGNOSIS — Z6829 Body mass index (BMI) 29.0-29.9, adult: Secondary | ICD-10-CM | POA: Diagnosis not present

## 2023-06-26 DIAGNOSIS — N958 Other specified menopausal and perimenopausal disorders: Secondary | ICD-10-CM | POA: Diagnosis not present

## 2023-06-26 DIAGNOSIS — Z01419 Encounter for gynecological examination (general) (routine) without abnormal findings: Secondary | ICD-10-CM | POA: Diagnosis not present

## 2023-06-26 DIAGNOSIS — E039 Hypothyroidism, unspecified: Secondary | ICD-10-CM | POA: Diagnosis not present

## 2023-06-26 DIAGNOSIS — K219 Gastro-esophageal reflux disease without esophagitis: Secondary | ICD-10-CM | POA: Diagnosis not present

## 2023-06-26 LAB — HM DEXA SCAN

## 2023-06-26 LAB — HM MAMMOGRAPHY

## 2023-06-28 ENCOUNTER — Other Ambulatory Visit: Payer: Self-pay

## 2023-06-28 ENCOUNTER — Other Ambulatory Visit: Payer: Medicare PPO

## 2023-06-28 DIAGNOSIS — R7309 Other abnormal glucose: Secondary | ICD-10-CM

## 2023-06-28 LAB — HEMOGLOBIN A1C: Hgb A1c MFr Bld: 6.2 % (ref 4.6–6.5)

## 2023-06-29 NOTE — Progress Notes (Signed)
Patient viewed via my chart, reached out to her via my chart as well to see if she had any questions.

## 2023-07-05 NOTE — Telephone Encounter (Signed)
Patient has been scheduled

## 2023-07-09 ENCOUNTER — Encounter: Payer: Self-pay | Admitting: Family Medicine

## 2023-07-09 ENCOUNTER — Ambulatory Visit: Payer: Medicare PPO | Admitting: Family Medicine

## 2023-07-09 VITALS — BP 112/72 | HR 64 | Temp 97.8°F | Ht 63.0 in | Wt 168.1 lb

## 2023-07-09 DIAGNOSIS — R7303 Prediabetes: Secondary | ICD-10-CM | POA: Diagnosis not present

## 2023-07-09 NOTE — Patient Instructions (Signed)
Follow up as needed or as scheduled We'll call you to schedule your nutrition appt Continue to work on low sugar, low carb diet Keep up the good work on regular exercise! Call with any questions or concerns Happy Holidays!!

## 2023-07-09 NOTE — Progress Notes (Signed)
   Subjective:    Patient ID: Amy Perkins, female    DOB: 05/12/1951, 72 y.o.   MRN: [id number]  HPI Pre-diabetes- pt is concerned about A1C of 6.2  Is walking regularly but reports that she is walking with her dog and this is more of a leisurely, slow pace than exercise.  Thinking about joining [employer].  + family hx of diabetes- mom, MGF, brother.  Reviewed what a carbohydrate is and need to limit them.   Review of Systems For ROS see HPI     Objective:   Physical Exam Vitals reviewed.  Constitutional:      General: She is not in acute distress.    Appearance: Normal appearance. She is not ill-appearing.  HENT:     Head: Normocephalic and atraumatic.  Eyes:     Extraocular Movements: Extraocular movements intact.     Conjunctiva/sclera: Conjunctivae normal.  Cardiovascular:     Rate and Rhythm: Normal rate.  Pulmonary:     Effort: Pulmonary effort is normal. No respiratory distress.  Skin:    General: Skin is warm and dry.  Neurological:     General: No focal deficit present.     Mental Status: She is alert and oriented to person, place, and time.  Psychiatric:        Mood and Affect: Mood normal.        Behavior: Behavior normal.        Thought Content: Thought content normal.           Assessment & Plan:  Pre-diabetes- A1C 6.2%  Pt reports strong family hx of DM.  Admits that her walking is more leisurely than exercise.  Is thinking about joining [employer]- encouraged her to do so.  Reviewed need for a low carb diet and what a carbohydrate is.  Will also refer to Nutrition for more in depth counseling.  Pt expressed understanding and is in agreement w/ plan.

## 2023-08-08 DIAGNOSIS — F3341 Major depressive disorder, recurrent, in partial remission: Secondary | ICD-10-CM | POA: Diagnosis not present

## 2023-08-08 DIAGNOSIS — F3342 Major depressive disorder, recurrent, in full remission: Secondary | ICD-10-CM | POA: Diagnosis not present

## 2023-08-08 DIAGNOSIS — Z5181 Encounter for therapeutic drug level monitoring: Secondary | ICD-10-CM | POA: Diagnosis not present

## 2023-08-08 DIAGNOSIS — F4312 Post-traumatic stress disorder, chronic: Secondary | ICD-10-CM | POA: Diagnosis not present

## 2023-08-08 DIAGNOSIS — F331 Major depressive disorder, recurrent, moderate: Secondary | ICD-10-CM | POA: Diagnosis not present

## 2023-08-28 ENCOUNTER — Other Ambulatory Visit: Payer: Self-pay | Admitting: Hematology & Oncology

## 2023-09-14 ENCOUNTER — Encounter: Payer: Medicare PPO | Attending: Family Medicine | Admitting: Dietician

## 2023-09-14 ENCOUNTER — Encounter: Payer: Self-pay | Admitting: Dietician

## 2023-09-14 VITALS — Wt 166.0 lb

## 2023-09-14 DIAGNOSIS — R7303 Prediabetes: Secondary | ICD-10-CM | POA: Diagnosis not present

## 2023-09-14 NOTE — Progress Notes (Signed)
Patient was seen on 09/14/23 for the Core Session 1 of Diabetes Prevention Program course at [facility]. The following learning objectives were met by the patient during this class:   Learning Objectives:  Be able to explain the purpose and benefits of the National Diabetes Prevention Program.  Be able to describe the events that will take place at every session.  Know the weight loss and physical activity goals established by the [facility] Diabetes Prevention Program.  Know their own individual weight loss and physical activity goals.  Be able to explain the important effect of self-monitoring on behavior change.   Goals:  Record food and beverage intake in "Food and Activity Tracker" over the next week.  Bring completed "Food and Activity Tracker" for session 1 to session 2 next week. Circle the foods or beverages you think are highest in fat and calories in your food tracker. Read the labels on the food you buy, and consider using measuring cups and spoons to help you calculate the amount you eat. We will talk about measuring in more detail in the coming weeks.   Follow-Up Plan: Attend Core Session 2 next week.  Bring completed "Food and Activity Tracker" next week to be reviewed by Lifestyle Coach.

## 2023-09-21 ENCOUNTER — Encounter: Payer: Medicare PPO | Admitting: Dietician

## 2023-09-25 ENCOUNTER — Encounter: Payer: Medicare PPO | Attending: Family Medicine | Admitting: Dietician

## 2023-09-25 ENCOUNTER — Encounter: Payer: Self-pay | Admitting: Dietician

## 2023-09-25 VITALS — Wt 162.0 lb

## 2023-09-25 DIAGNOSIS — R7303 Prediabetes: Secondary | ICD-10-CM | POA: Insufficient documentation

## 2023-09-25 DIAGNOSIS — M1711 Unilateral primary osteoarthritis, right knee: Secondary | ICD-10-CM | POA: Diagnosis not present

## 2023-09-25 NOTE — Progress Notes (Signed)
 Patient was seen on 09/25/23 for the Core Session 2 of Diabetes Prevention Program course at [facility]. By the end of this session patients are able to complete the following objectives:   Learning Objectives: Self-monitor their weight during the weeks following Session 2.  Describe the relationship between fat and calories.  Explain the reason for, and basic principles of, self-monitoring fat grams and calories.  Identify their personal fat gram goals.  Use the ?Fat and Calorie Counter? to calculate the calories and fat grams of a given selection of foods.  Keep a running total of the fat grams they eat each day.  Calculate fat, calories, and serving sizes from nutrition labels.   Goals:  Weigh yourself at the same time each day, or every few days, and record your weight in your Food and Activity Tracker. Write down everything you eat and drink in your Food and Activity Tracker. Measure portions as much as you can, and start reading labels.  Use the ?Fat and Calorie Counter? to figure out the amount of fat and calories in what you ate, and write the amount down in your Food and Activity Tracker. Keep a running fat gram total throughout the day. Come as close to your fat gram goal as you can.   Follow-Up Plan: Attend Core Session 3 next week.  Bring completed Food and Activity Tracker next week to be reviewed by Lifestyle Coach.

## 2023-09-26 DIAGNOSIS — Z08 Encounter for follow-up examination after completed treatment for malignant neoplasm: Secondary | ICD-10-CM | POA: Diagnosis not present

## 2023-09-26 DIAGNOSIS — Z09 Encounter for follow-up examination after completed treatment for conditions other than malignant neoplasm: Secondary | ICD-10-CM | POA: Diagnosis not present

## 2023-09-26 DIAGNOSIS — L578 Other skin changes due to chronic exposure to nonionizing radiation: Secondary | ICD-10-CM | POA: Diagnosis not present

## 2023-09-26 DIAGNOSIS — Z85820 Personal history of malignant melanoma of skin: Secondary | ICD-10-CM | POA: Diagnosis not present

## 2023-09-27 ENCOUNTER — Encounter: Payer: Self-pay | Admitting: Medical Oncology

## 2023-09-27 ENCOUNTER — Other Ambulatory Visit: Payer: Self-pay | Admitting: Medical Oncology

## 2023-09-27 ENCOUNTER — Inpatient Hospital Stay: Payer: Medicare PPO | Attending: Hematology & Oncology

## 2023-09-27 ENCOUNTER — Inpatient Hospital Stay: Payer: Medicare PPO | Admitting: Medical Oncology

## 2023-09-27 VITALS — BP 116/59 | HR 58 | Temp 98.0°F | Resp 18 | Ht 63.0 in | Wt 161.8 lb

## 2023-09-27 DIAGNOSIS — Z86711 Personal history of pulmonary embolism: Secondary | ICD-10-CM | POA: Insufficient documentation

## 2023-09-27 DIAGNOSIS — Z7901 Long term (current) use of anticoagulants: Secondary | ICD-10-CM | POA: Insufficient documentation

## 2023-09-27 DIAGNOSIS — D6851 Activated protein C resistance: Secondary | ICD-10-CM

## 2023-09-27 DIAGNOSIS — R7303 Prediabetes: Secondary | ICD-10-CM | POA: Insufficient documentation

## 2023-09-27 LAB — CMP (CANCER CENTER ONLY)
ALT: 26 U/L (ref 0–44)
AST: 19 U/L (ref 15–41)
Albumin: 4.5 g/dL (ref 3.5–5.0)
Alkaline Phosphatase: 52 U/L (ref 38–126)
Anion gap: 7 (ref 5–15)
BUN: 19 mg/dL (ref 8–23)
CO2: 30 mmol/L (ref 22–32)
Calcium: 9.7 mg/dL (ref 8.9–10.3)
Chloride: 102 mmol/L (ref 98–111)
Creatinine: 0.74 mg/dL (ref 0.44–1.00)
GFR, Estimated: >60 mL/min (ref >60)
Glucose, Bld: 78 mg/dL (ref 70–99)
Potassium: 3.8 mmol/L (ref 3.5–5.1)
Sodium: 139 mmol/L (ref 135–145)
Total Bilirubin: 0.4 mg/dL (ref 0.0–1.2)
Total Protein: 6.5 g/dL (ref 6.5–8.1)

## 2023-09-27 LAB — CBC WITH DIFFERENTIAL (CANCER CENTER ONLY)
Abs Immature Granulocytes: 0.03 10*3/uL (ref 0.00–0.07)
Basophils Absolute: 0 10*3/uL (ref 0.0–0.1)
Basophils Relative: 0 %
Eosinophils Absolute: 0 10*3/uL (ref 0.0–0.5)
Eosinophils Relative: 0 %
HCT: 42.8 % (ref 36.0–46.0)
Hemoglobin: 14.8 g/dL (ref 12.0–15.0)
Immature Granulocytes: 0 %
Lymphocytes Relative: 27 %
Lymphs Abs: 2.9 10*3/uL (ref 0.7–4.0)
MCH: 31.4 pg (ref 26.0–34.0)
MCHC: 34.6 g/dL (ref 30.0–36.0)
MCV: 90.7 fL (ref 80.0–100.0)
Monocytes Absolute: 0.7 10*3/uL (ref 0.1–1.0)
Monocytes Relative: 7 %
Neutro Abs: 7 10*3/uL (ref 1.7–7.7)
Neutrophils Relative %: 66 %
Platelet Count: 210 10*3/uL (ref 150–400)
RBC: 4.72 MIL/uL (ref 3.87–5.11)
RDW: 12.7 % (ref 11.5–15.5)
WBC Count: 10.7 10*3/uL — ABNORMAL HIGH (ref 4.0–10.5)
nRBC: 0 % (ref 0.0–0.2)

## 2023-09-27 LAB — LACTATE DEHYDROGENASE: LDH: 170 U/L (ref 98–192)

## 2023-09-27 MED ORDER — APIXABAN 5 MG PO TABS: 5.0000 mg | ORAL_TABLET | Freq: Two times a day (BID) | ORAL | 3 refills | Status: DC

## 2023-09-27 NOTE — Progress Notes (Addendum)
 Hematology and Oncology Follow Up Visit  Amy Perkins [id number] August 07, 1951 73 y.o. 09/27/2023   Principle Diagnosis:  Pulmonary Embolism -- Facor V Leiden/Prothrombin II mutation  Current Therapy:   Eliquis  5 mg po BID -- lifelong     Interim History:  Amy Perkins is back for follow-up.  She comes yearly. She reports that she has been doing really well since our last visit.   She reports that she is doing well. She has no concerns today. She has recently been seeing a nutritionist in efforts to help with her pre-diabetes. In tern she has lost some weight through diet changes.   A few family members have had the flu recently- she has felt well.   She has not had any problems with eliquis  including any bleeding or significant bruising episodes. No hemoptysis, SOB, chest pain or calf pain.   She has had no fever.  There is no cough.  She has had no weight loss or weight gain.  There is no change in bowel or bladder habits.  She has had no leg swelling.  Overall, I would say her performance status is probably ECOG 1.    Wt Readings from Last 3 Encounters:  09/27/23 161 lb 12.8 oz (73.4 kg)  09/25/23 162 lb (73.5 kg)  09/14/23 166 lb (75.3 kg)    Medications:  Current Outpatient Medications:    ALPRAZolam  (XANAX ) 0.5 MG tablet, Take 0.5 mg by mouth See admin instructions. Take 1/2 tablet by mouth 2 times daily (morning and 3PM) , and 1 tablet at bedtime., Disp: , Rfl:    budesonide -formoterol  (SYMBICORT ) 160-4.5 MCG/ACT inhaler, Inhale 2 puffs into the lungs 2 (two) times daily., Disp: 1 each, Rfl: 12   citalopram  (CELEXA ) 40 MG tablet, Take 1 tablet (40 mg total) by mouth daily., Disp: 30 tablet, Rfl: 5   levothyroxine  (SYNTHROID ) 50 MCG tablet, TAKE 1 TABLET BY MOUTH EVERY DAY, Disp: 90 tablet, Rfl: 1   omeprazole  (PRILOSEC) 20 MG capsule, TAKE 1 CAPSULE BY MOUTH TWICE A DAY (Patient taking differently: Take 20 mg by mouth 2 (two) times daily before a meal.), Disp: 180 capsule,  Rfl: 1   simvastatin  (ZOCOR ) 40 MG tablet, TAKE 1 TABLET BY MOUTH EVERYDAY AT BEDTIME (Patient taking differently: Take 40 mg by mouth daily at 6 PM.), Disp: 90 tablet, Rfl: 1   apixaban  (ELIQUIS ) 5 MG TABS tablet, Take 1 tablet (5 mg total) by mouth 2 (two) times daily., Disp: 180 tablet, Rfl: 3  Allergies: No Known Allergies  Past Medical History, Surgical history, Social history, and Family History were reviewed and updated.  Review of Systems: Review of Systems  Constitutional: Negative.   HENT:  Negative.    Eyes: Negative.   Respiratory: Negative.    Cardiovascular: Negative.   Gastrointestinal: Negative.   Endocrine: Negative.   Genitourinary: Negative.    Musculoskeletal: Negative.   Skin: Negative.   Neurological: Negative.   Hematological: Negative.   Psychiatric/Behavioral: Negative.      Physical Exam:  height is 5' 3 (1.6 m) and weight is 161 lb 12.8 oz (73.4 kg). Her oral temperature is 98 F (36.7 C). Her blood pressure is 116/59 (abnormal) and her pulse is 58 (abnormal). Her respiration is 18 and oxygen saturation is 99%.   Wt Readings from Last 3 Encounters:  09/27/23 161 lb 12.8 oz (73.4 kg)  09/25/23 162 lb (73.5 kg)  09/14/23 166 lb (75.3 kg)    Physical Exam Vitals reviewed.  HENT:  Head: Normocephalic and atraumatic.  Eyes:     Pupils: Pupils are equal, round, and reactive to light.  Cardiovascular:     Rate and Rhythm: Normal rate and regular rhythm.     Heart sounds: Normal heart sounds.  Pulmonary:     Effort: Pulmonary effort is normal.     Breath sounds: Normal breath sounds.  Abdominal:     General: Bowel sounds are normal.     Palpations: Abdomen is soft.  Musculoskeletal:        General: No tenderness or deformity. Normal range of motion.     Cervical back: Normal range of motion.  Lymphadenopathy:     Cervical: No cervical adenopathy.  Skin:    General: Skin is warm and dry.     Findings: No erythema or rash.  Neurological:      Mental Status: She is alert and oriented to person, place, and time.  Psychiatric:        Behavior: Behavior normal.        Thought Content: Thought content normal.        Judgment: Judgment normal.    Lab Results  Component Value Date   WBC 10.7 (H) 09/27/2023   HGB 14.8 09/27/2023   HCT 42.8 09/27/2023   MCV 90.7 09/27/2023   PLT 210 09/27/2023     Chemistry      Component Value Date/Time   NA 139 09/27/2023 0935   NA 140 02/20/2023 1434   K 3.8 09/27/2023 0935   CL 102 09/27/2023 0935   CO2 30 09/27/2023 0935   BUN 19 09/27/2023 0935   BUN 15 02/20/2023 1434   CREATININE 0.74 09/27/2023 0935      Component Value Date/Time   CALCIUM 9.7 09/27/2023 0935   ALKPHOS 52 09/27/2023 0935   AST 19 09/27/2023 0935   ALT 26 09/27/2023 0935   BILITOT 0.4 09/27/2023 0935     Encounter Diagnoses  Name Primary?   Factor 5 Leiden mutation, heterozygous (HCC) Yes   History of pulmonary embolism     Impression and Plan: Amy Perkins is a very charming 73 year old white female.  She has 2 hypercoagulable state.  She had a pulmonary embolism.  She is on lifelong anticoagulation.  She is doing really well. Labs look good- slight elevation in WBC count may be from influenza exposure. ANC normal. Will monitor.  She will continue her general follow up with her PCP and continue Eliquis  at current dose.   RTC 1 year APP, labs (CBC, CMP)   Lauraine CHRISTELLA Dais, PA-C 2/6/202510:19 AM

## 2023-09-28 ENCOUNTER — Encounter: Payer: Self-pay | Admitting: Dietician

## 2023-09-28 ENCOUNTER — Encounter: Payer: Medicare PPO | Attending: Family Medicine | Admitting: Dietician

## 2023-09-28 VITALS — Wt 163.0 lb

## 2023-09-28 DIAGNOSIS — R7303 Prediabetes: Secondary | ICD-10-CM | POA: Insufficient documentation

## 2023-09-28 NOTE — Progress Notes (Signed)
 Patient was seen on 09/28/23 for the Core Session 3 of Diabetes Prevention Program course at [facility]. By the end of this session patients are able to complete the following objectives:   Learning Objectives: Weigh and measure foods. Estimate the fat and calorie content of common foods. Describe three ways to eat less fat and fewer calories. Create a plan to eat less fat for the following week.   Goals:  Track weight when weighing outside of class.  Track food and beverages eaten each day in Food and Activity Tracker and include fat grams and calories for each.  Try to stay within fat gram goal.  Complete plan for eating less high fat foods and answer related homework questions.    Follow-Up Plan: Attend Core Session 4 next week.  Bring completed Food and Activity Tracker next week to be reviewed by Lifestyle Coach.

## 2023-10-05 ENCOUNTER — Encounter: Payer: Medicare PPO | Admitting: Dietician

## 2023-10-05 ENCOUNTER — Encounter: Payer: Self-pay | Admitting: Dietician

## 2023-10-05 DIAGNOSIS — R7303 Prediabetes: Secondary | ICD-10-CM

## 2023-10-05 NOTE — Progress Notes (Signed)
Patient was seen on 10/05/23 for the Core Session 4 of Diabetes Prevention Program course at [facility]. By the end of this session patients are able to complete the following objectives:   Learning Objectives: Explain the health benefits of eating less fat and fewer calories. Describe the MyPlate food guide and its recommendations, including how to reduce fat and calories in our diet. Compare and contrast MyPlate guidelines with participants' eating habits. List ways to replace high-fat and high-calorie foods with low-fat and low-calorie foods. Explain the importance of eating plenty of whole grains, vegetables, and fruits, while staying within fat gram goals. Explain the importance of eating foods from all groups of MyPlate and of eating a variety of foods from within each group. Explain why a balanced diet is beneficial to health. Explain why eating the same foods over and over is not the best strategy for long-term success.   Goals:  Record weight taken outside of class.  Track foods and beverages eaten each day in the "Food and Activity Tracker," including calories and fat grams for each item.  Practice comparing what you eat with the recommendations of MyPlate using the "Rate Your Plate" handout.  Complete the "Rate Your Plate" handout form on at least 3 days.  Answer homework questions.   Follow-Up Plan: Attend Core Session 5 next week.  Bring completed "Food and Activity Tracker" next week to be reviewed by Lifestyle Coach.

## 2023-10-19 ENCOUNTER — Encounter: Payer: Medicare PPO | Admitting: Dietician

## 2023-10-19 ENCOUNTER — Encounter: Payer: Self-pay | Admitting: Dietician

## 2023-10-19 DIAGNOSIS — R7303 Prediabetes: Secondary | ICD-10-CM

## 2023-10-19 NOTE — Progress Notes (Signed)
 Patient was seen on 10/19/23 for the Core Session 5 of Diabetes Prevention Program course at [facility]. By the end of this session patients are able to complete the following objectives:   Learning Objectives: Establish a physical activity goal. Explain the importance of the physical activity goal. Describe their current level of physical activity. Name ways that they are already physically active. Develop personal plans for physical activity for the next week.   Goals:  Record weight taken outside of class.  Track foods and beverages eaten each day in the "Food and Activity Tracker," including calories and fat grams for each item.  Make an Activity Plan including date, specific type of activity, and length of time you plan to be active that includes at last 60 minutes of activity for the week.  Track activity type, minutes you were active, and distance you reached each day in the "Food and Activity Tracker."   Follow-Up Plan: Attend Core Session 6 next week.  Bring completed "Food and Activity Tracker" next week to be reviewed by Lifestyle Coach.

## 2023-10-21 ENCOUNTER — Other Ambulatory Visit: Payer: Self-pay | Admitting: Family Medicine

## 2023-10-22 ENCOUNTER — Other Ambulatory Visit: Payer: Self-pay | Admitting: Family Medicine

## 2023-10-23 DIAGNOSIS — M1711 Unilateral primary osteoarthritis, right knee: Secondary | ICD-10-CM | POA: Diagnosis not present

## 2023-10-26 ENCOUNTER — Encounter: Payer: Self-pay | Admitting: Dietician

## 2023-10-26 ENCOUNTER — Encounter: Payer: Medicare PPO | Attending: Family Medicine | Admitting: Dietician

## 2023-10-26 DIAGNOSIS — R7303 Prediabetes: Secondary | ICD-10-CM | POA: Insufficient documentation

## 2023-10-26 NOTE — Progress Notes (Signed)
 Patient was seen on 10/26/23 for the Core Session 6 of Diabetes Prevention Program course at [facility]. By the end of this session patients are able to complete the following objectives:   Learning Objectives: Graph their daily physical activity.  Describe two ways of finding the time to be active.  Define "lifestyle activity."  Describe how to prevent injury.  Develop an activity plan for the coming week.   Goals:  Record weight taken outside of class.  Track foods and beverages eaten each day in the "Food and Activity Tracker," including calories and fat grams for each item.   Track activity type, minutes you were active, and distance you reached each day in the "Food and Activity Tracker."  Set aside one 20 to 30-minute block of time every day or find two or more periods of 10 to15 minutes each for physical activity.  Warm up, cool down, and stretch. Make a Physical Activities Plan for the Week.   Follow-Up Plan: Attend Core Session 7 next week.  Bring completed "Food and Activity Tracker" next week to be reviewed by Lifestyle Coach.

## 2023-10-27 ENCOUNTER — Other Ambulatory Visit: Payer: Self-pay | Admitting: Family Medicine

## 2023-11-09 ENCOUNTER — Encounter: Payer: Medicare PPO | Admitting: Dietician

## 2023-11-09 ENCOUNTER — Encounter: Payer: Self-pay | Admitting: Dietician

## 2023-11-09 DIAGNOSIS — R7303 Prediabetes: Secondary | ICD-10-CM

## 2023-11-09 NOTE — Progress Notes (Signed)
 Patient was seen on 11/09/2023 for the Core Session 7 of Diabetes Prevention Program course at [facility]. By the end of this session patients are able to complete the following objectives:   Learning Objectives: Define calorie balance. Explain how healthy eating and being active are related in terms of calorie balance.  Describe the relationship between calorie balance and weight loss.  Describe his or her progress as it relates to calorie balance.  Develop an activity plan for the coming week.   Goals:  Record weight taken outside of class.  Track foods and beverages eaten each day in the "Food and Activity Tracker," including calories and fat grams for each item.   Track activity type, minutes you were active, and distance you reached each day in the "Food and Activity Tracker."  Set aside one 20 to 30-minute block of time every day or find two or more periods of 10 to15 minutes each for physical activity.  Make a Physical Activities Plan for the Week.  Make active lifestyle choices all through the day  Stay at or go slightly over activity goal.   Follow-Up Plan: Attend Core Session 8 next week.  Bring completed "Food and Activity Tracker" next week to be reviewed by Lifestyle Coach.

## 2023-11-16 ENCOUNTER — Encounter: Payer: Self-pay | Admitting: Dietician

## 2023-11-16 ENCOUNTER — Encounter: Payer: Medicare PPO | Admitting: Dietician

## 2023-11-16 DIAGNOSIS — R7303 Prediabetes: Secondary | ICD-10-CM

## 2023-11-16 NOTE — Progress Notes (Signed)
 Patient was seen on 11/16/23 for the Core Session 8 of Diabetes Prevention Program course at [facility]. By the end of this session patients are able to complete the following objectives:   Learning Objectives: Recognize positive and negative food and activity cues.  Change negative food and activity cues to positive cues.  Add positive cues for activity and eliminate cues for inactivity.  Develop a plan for removing one problem food cue for the coming week.   Goals:  Record weight taken outside of class.  Track foods and beverages eaten each day in the "Food and Activity Tracker," including calories and fat grams for each item.   Track activity type, minutes you were active, and distance you reached each day in the "Food and Activity Tracker."  Set aside one 20 to 30-minute block of time every day or find two or more periods of 10 to15 minutes each for physical activity.  Remove one problem food cue.  Add one positive cue for being more active.  Follow-Up Plan: Attend Core Session 9 next week.  Bring completed "Food and Activity Tracker" next week to be reviewed by Lifestyle Coach.

## 2023-11-23 ENCOUNTER — Encounter: Payer: Self-pay | Admitting: Dietician

## 2023-11-23 ENCOUNTER — Encounter: Payer: Medicare PPO | Attending: Family Medicine | Admitting: Dietician

## 2023-11-23 DIAGNOSIS — R7303 Prediabetes: Secondary | ICD-10-CM | POA: Insufficient documentation

## 2023-11-23 NOTE — Progress Notes (Signed)
 Patient was seen on 11/23/23 for the Core Session 9 of Diabetes Prevention Program course at [facility]. By the end of this session patients are able to complete the following objectives:   Learning Objectives: List and describe five steps to problem solving.  Apply the five problem solving steps to resolve a problem he or she has with eating less fat and fewer calories or being more active.   Goals:  Record weight taken outside of class.  Track foods and beverages eaten each day in the "Food and Activity Tracker," including calories and fat grams for each item.   Track activity type, minutes you were active, and distance you reached each day in the "Food and Activity Tracker."  Set aside one 20 to 30-minute block of time every day or find two or more periods of 10 to15 minutes each for physical activity.  Use problem solving action plan created during session to problem solve.   Follow-Up Plan: Attend Core Session 10 next week.  Bring completed "Food and Activity Tracker" next week to be reviewed by Lifestyle Coach. Bring menus from favorite restaurants to next session for future discussion.

## 2023-12-11 ENCOUNTER — Encounter: Payer: Self-pay | Admitting: Family Medicine

## 2023-12-11 ENCOUNTER — Ambulatory Visit: Payer: Medicare PPO | Admitting: Family Medicine

## 2023-12-11 VITALS — BP 104/64 | HR 61 | Temp 98.2°F | Ht 63.0 in | Wt 153.2 lb

## 2023-12-11 DIAGNOSIS — E782 Mixed hyperlipidemia: Secondary | ICD-10-CM

## 2023-12-11 DIAGNOSIS — Z0000 Encounter for general adult medical examination without abnormal findings: Secondary | ICD-10-CM | POA: Diagnosis not present

## 2023-12-11 DIAGNOSIS — R7303 Prediabetes: Secondary | ICD-10-CM

## 2023-12-11 LAB — CBC WITH DIFFERENTIAL/PLATELET
Basophils Absolute: 0 10*3/uL (ref 0.0–0.1)
Basophils Relative: 0.7 % (ref 0.0–3.0)
Eosinophils Absolute: 0.1 10*3/uL (ref 0.0–0.7)
Eosinophils Relative: 2.3 % (ref 0.0–5.0)
HCT: 45 % (ref 36.0–46.0)
Hemoglobin: 15.1 g/dL — ABNORMAL HIGH (ref 12.0–15.0)
Lymphocytes Relative: 29.1 % (ref 12.0–46.0)
Lymphs Abs: 1.7 10*3/uL (ref 0.7–4.0)
MCHC: 33.6 g/dL (ref 30.0–36.0)
MCV: 92.5 fl (ref 78.0–100.0)
Monocytes Absolute: 0.4 10*3/uL (ref 0.1–1.0)
Monocytes Relative: 6.4 % (ref 3.0–12.0)
Neutro Abs: 3.6 10*3/uL (ref 1.4–7.7)
Neutrophils Relative %: 61.5 % (ref 43.0–77.0)
Platelets: 214 10*3/uL (ref 150.0–400.0)
RBC: 4.87 Mil/uL (ref 3.87–5.11)
RDW: 13.2 % (ref 11.5–15.5)
WBC: 5.9 10*3/uL (ref 4.0–10.5)

## 2023-12-11 LAB — BASIC METABOLIC PANEL WITH GFR
BUN: 13 mg/dL (ref 6–23)
CO2: 30 meq/L (ref 19–32)
Calcium: 9.7 mg/dL (ref 8.4–10.5)
Chloride: 102 meq/L (ref 96–112)
Creatinine, Ser: 0.69 mg/dL (ref 0.40–1.20)
GFR: 85.85 mL/min (ref >60.00)
Glucose, Bld: 112 mg/dL — ABNORMAL HIGH (ref 70–99)
Potassium: 3.9 meq/L (ref 3.5–5.1)
Sodium: 138 meq/L (ref 135–145)

## 2023-12-11 LAB — LIPID PANEL
Cholesterol: 155 mg/dL (ref 0–200)
HDL: 46 mg/dL (ref >39.00)
LDL Cholesterol: 82 mg/dL (ref 0–99)
NonHDL: 108.75
Total CHOL/HDL Ratio: 3
Triglycerides: 133 mg/dL (ref 0.0–149.0)
VLDL: 26.6 mg/dL (ref 0.0–40.0)

## 2023-12-11 LAB — HEPATIC FUNCTION PANEL
ALT: 31 U/L (ref 0–35)
AST: 21 U/L (ref 0–37)
Albumin: 4.5 g/dL (ref 3.5–5.2)
Alkaline Phosphatase: 51 U/L (ref 39–117)
Bilirubin, Direct: 0 mg/dL (ref 0.0–0.3)
Total Bilirubin: 0.4 mg/dL (ref 0.2–1.2)
Total Protein: 6.6 g/dL (ref 6.0–8.3)

## 2023-12-11 LAB — TSH: TSH: 2.1 u[IU]/mL (ref 0.35–5.50)

## 2023-12-11 LAB — HEMOGLOBIN A1C: Hgb A1c MFr Bld: 6.1 % (ref 4.6–6.5)

## 2023-12-11 MED ORDER — CITALOPRAM HYDROBROMIDE 40 MG PO TABS: 40.0000 mg | ORAL_TABLET | Freq: Every day | ORAL | 5 refills | Status: AC

## 2023-12-11 NOTE — Telephone Encounter (Signed)
 Lab results have been discussed.   Verbalized understanding? Yes  Are there any questions? No

## 2023-12-11 NOTE — Progress Notes (Signed)
   Subjective:    Patient ID: Amy Perkins, female    DOB: [birth year], 73 y.o.   MRN: [id number]  HPI CPE- UTD on mammo, colonoscopy, Tdap, PNA  Patient Care Team    Relationship Specialty Notifications Start End  Jess Morita, MD PCP - General   08/03/10   Tami Falcon, MD Consulting Physician Gastroenterology  10/04/15   Phebe Brasil, MD Consulting Physician Neurology  10/04/15   Thurman Flores, MD Consulting Physician Obstetrics and Gynecology  10/04/15   [facility]    10/10/17   Dema Filler, MD Consulting Physician Ophthalmology  10/10/17   Manson Seitz  Dentistry  10/10/17   Gaines Joy  Dermatology  10/10/17   Florina Husbands, MD (Inactive) Consulting Physician Hematology  10/16/18   Rober Chimera, MD Consulting Physician Orthopedic Surgery  10/16/18     Health Maintenance  Topic Date Due   Zoster Vaccines- Shingrix (1 of 2) 02/12/2000   COVID-19 Vaccine (8 - 2024-25 season) 10/25/2023   Medicare Annual Wellness (AWV)  02/08/2024   INFLUENZA VACCINE  03/21/2024   MAMMOGRAM  06/25/2024   Colonoscopy  08/04/2027   DTaP/Tdap/Td (4 - Td or Tdap) 05/30/2033   Pneumonia Vaccine 8+ Years old  Completed   DEXA SCAN  Completed   Hepatitis C Screening  Completed   HPV VACCINES  Aged Out   Meningococcal B Vaccine  Aged Out      Review of Systems Patient reports no vision/ hearing changes, adenopathy,fever,  persistant/recurrent hoarseness , swallowing issues, chest pain, palpitations, edema, persistant/recurrent cough, hemoptysis, dyspnea (rest/exertional/paroxysmal nocturnal), gastrointestinal bleeding (melena, rectal bleeding), abdominal pain, significant heartburn, bowel changes, GU symptoms (dysuria, hematuria, incontinence), Gyn symptoms (abnormal  bleeding, pain),  syncope, focal weakness, memory loss, numbness & tingling, skin/hair/nail changes, abnormal bruising or bleeding, anxiety, or depression.   + 10 lb weight loss    Objective:   Physical Exam General  Appearance:    Alert, cooperative, no distress, appears stated age  Head:    Normocephalic, without obvious abnormality, atraumatic  Eyes:    PERRL, conjunctiva/corneas clear, EOM's intact both eyes  Ears:    Normal TM's and external ear canals, both ears  Nose:   Nares normal, septum midline, mucosa normal, no drainage    or sinus tenderness  Throat:   Lips, mucosa, and tongue normal; teeth and gums normal  Neck:   Supple, symmetrical, trachea midline, no adenopathy;    Thyroid : no enlargement/tenderness/nodules  Back:     Symmetric, no curvature, ROM normal, no CVA tenderness  Lungs:     Clear to auscultation bilaterally, respirations unlabored  Chest Wall:    No tenderness or deformity   Heart:    Regular rate and rhythm, S1 and S2 normal, no murmur, rub   or gallop  Breast Exam:    Deferred to GYN  Abdomen:     Soft, non-tender, bowel sounds active all four quadrants,    no masses, no organomegaly  Genitalia:    Deferred to GYN  Rectal:    Extremities:   Extremities normal, atraumatic, no cyanosis or edema  Pulses:   2+ and symmetric all extremities  Skin:   Skin color, texture, turgor normal, no rashes or lesions  Lymph nodes:   Cervical, supraclavicular, and axillary nodes normal  Neurologic:   CNII-XII intact, normal strength, sensation and reflexes    throughout          Assessment & Plan:

## 2023-12-11 NOTE — Telephone Encounter (Signed)
-----   Message from Laymon Priest sent at 12/11/2023  4:56 PM EDT ----- Labs look great!  No changes at this time

## 2023-12-11 NOTE — Assessment & Plan Note (Signed)
 Pt's PE WNL.  She has lost 10 lbs since last visit.  Applauded her efforts.  UTD on mammo, colonoscopy, Tdap, PNA.  Check labs.  Anticipatory guidance provided.

## 2023-12-11 NOTE — Patient Instructions (Addendum)
 Follow up in 6 months to recheck cholesterol We'll notify you of your lab results and make any changes if needed Keep up the good work on healthy diet and regular exercise- you look great! Get your shingles shot at the pharmacy at your convenience Call with any questions or concerns Stay Safe!  Stay Healthy! Happy Spring!!!

## 2023-12-14 ENCOUNTER — Encounter: Payer: Self-pay | Admitting: Dietician

## 2023-12-14 ENCOUNTER — Encounter: Payer: Medicare PPO | Admitting: Dietician

## 2023-12-14 DIAGNOSIS — R7303 Prediabetes: Secondary | ICD-10-CM

## 2023-12-14 NOTE — Progress Notes (Signed)
 Patient was seen on 12/14/23 for the Core Session 10 of Diabetes Prevention Program course at [facility]. By the end of this session patients are able to complete the following objectives:   Learning Objectives: List and describe the four keys for healthy eating out.  Give examples of how to apply these keys at the type of restaurants that the participants go to regularly.  Make an appropriate meal selection from a restaurant menu.  Demonstrate how to ask for a substitute item using assertive language and a polite tone of voice.    Goals:  Record weight taken outside of class.  Track foods and beverages eaten each day in the "Food and Activity Tracker," including calories and fat grams for each item.   Track activity type, minutes you were active, and distance you reached each day in the "Food and Activity Tracker."  Set aside one 20 to 30-minute block of time every day or find two or more periods of 10 to15 minutes each for physical activity.  Utilize positive action plan and complete questions on "To Do List."   Follow-Up Plan: Attend Core Session 11 next week.  Bring completed "Food and Activity Tracker" next week to be reviewed by Lifestyle Coach.

## 2023-12-21 ENCOUNTER — Encounter: Payer: Medicare PPO | Attending: Family Medicine | Admitting: Dietician

## 2023-12-21 ENCOUNTER — Encounter: Payer: Self-pay | Admitting: Dietician

## 2023-12-21 DIAGNOSIS — R7303 Prediabetes: Secondary | ICD-10-CM | POA: Insufficient documentation

## 2023-12-21 NOTE — Progress Notes (Signed)
 On 12/21/23 completed Session 11 of Diabetes Prevention Program course  with [facility]. By the end of this session patients are able to complete the following objectives:   Learning Objectives: Give examples of negative thoughts that could prevent them from meeting their goals of losing weight and being more physically active.  Describe how to stop negative thoughts and talk back to them with positive thoughts.  Practice 1) stopping negative thoughts and 2) talking back to negative thoughts with positive ones.    Goals:  Record weight taken outside of class.  Track foods and beverages eaten each day in the "Food and Activity Tracker," including calories and fat grams for each item.   Track activity type, minutes you were active, and distance you reached each day in the "Food and Activity Tracker."  If you have any negative thoughts-write them in your Food and Activity Trackers, along with how you talked back to them. Practice stopping negative thoughts and talking back to them with positive thoughts.   Follow-Up Plan: Attend Core Session 12 next week.  Bring completed "Food and Activity Tracker" next week to be reviewed by Lifestyle Coach.

## 2023-12-28 ENCOUNTER — Encounter: Payer: Self-pay | Admitting: Dietician

## 2023-12-28 ENCOUNTER — Encounter (HOSPITAL_BASED_OUTPATIENT_CLINIC_OR_DEPARTMENT_OTHER): Payer: Medicare PPO | Admitting: Dietician

## 2023-12-28 DIAGNOSIS — R7303 Prediabetes: Secondary | ICD-10-CM

## 2023-12-28 NOTE — Progress Notes (Signed)
 Patient was seen on 12/28/23 for the Core Session 12 of Diabetes Prevention Program course at [facility]. By the end of this session patients are able to complete the following objectives:   Learning Objectives: Describe their current progress toward defined goals. Describe common causes for slipping from healthy eating or being active. Explain what to do to get back on their feet after a slip.  Goals:  Record weight taken outside of class.  Track foods and beverages eaten each day in the "Food and Activity Tracker," including calories and fat grams for each item.   Track activity type, minutes active, and distance reached each day in the "Food and Activity Tracker."  Try out the two action plans created during session- "Slips from Healthy Eating: Action Plan" and "Slips from Being Active: Action Plan" Answer questions on the handout.   Follow-Up Plan: Attend Core Session 13 next week.  Bring completed "Food and Activity Tracker" next week to be reviewed by Lifestyle Coach.

## 2024-01-11 ENCOUNTER — Encounter (HOSPITAL_BASED_OUTPATIENT_CLINIC_OR_DEPARTMENT_OTHER): Payer: Medicare PPO | Admitting: Dietician

## 2024-01-11 ENCOUNTER — Encounter: Payer: Self-pay | Admitting: Dietician

## 2024-01-11 DIAGNOSIS — R7303 Prediabetes: Secondary | ICD-10-CM

## 2024-01-11 NOTE — Progress Notes (Signed)
 Patient was seen on 01/11/24 for the Core Session 13 of Diabetes Prevention Program course at [facility]. By the end of this session patients are able to complete the following objectives:   Learning Objectives: Describe ways to add interest and variety to their activity plans. Define ?aerobic fitness. Explain the four F.I.T.T. principles (frequency, intensity, time, and type of activity) and how they relate to aerobic fitness.   Goals:  Record weight taken outside of class.  Track foods and beverages eaten each day in the "Food and Activity Tracker," including calories and fat grams for each item.   Track activity type, minutes you were active, and distance you reached each day in the "Food and Activity Tracker."  Do your best to reach activity goal for the week. Use one of the F.I.T.T. principles to jump start workouts. Document activity level on the "To Do Next Week" handout.  Follow-Up Plan: Attend Core Session 14 next week.  Bring completed "Food and Activity Tracker" next week to be reviewed by Lifestyle Coach.

## 2024-01-25 ENCOUNTER — Encounter: Payer: Self-pay | Admitting: Dietician

## 2024-01-25 ENCOUNTER — Encounter: Payer: Medicare PPO | Attending: Family Medicine | Admitting: Dietician

## 2024-01-25 DIAGNOSIS — R7303 Prediabetes: Secondary | ICD-10-CM | POA: Insufficient documentation

## 2024-01-25 NOTE — Progress Notes (Signed)
 Patient was seen on 01/25/24 for the Core Session 14 of Diabetes Prevention Program course at [facility]. By the end of this session patients are able to complete the following objectives:   Learning Objectives: Give examples of problem social cues and helpful social cues.  Explain how to remove problem social cues and add helpful ones.  Describe ways of coping with vacations and social events such as parties, holidays, and visits from relatives and friends.  Create an action plan to change a problem social cue and add a helpful one.   Goals:  Record weight taken outside of class.  Track foods and beverages eaten each day in the "Food and Activity Tracker," including calories and fat grams for each item.   Track activity type, minutes you were active, and distance you reached each day in the "Food and Activity Tracker."  Do your best to reach activity goal for the week. Use action plan created during session to change a problem social cue and add a helpful social cue.  Answer questions regarding success of changing social cues on "To Do Next Week" handout.   Follow-Up Plan: Attend Core Session 15 next week.  Bring completed "Food and Activity Tracker" next week to be reviewed by Lifestyle Coach.

## 2024-02-01 ENCOUNTER — Encounter: Payer: Self-pay | Admitting: Dietician

## 2024-02-01 ENCOUNTER — Encounter: Payer: Medicare PPO | Attending: Family Medicine | Admitting: Dietician

## 2024-02-01 DIAGNOSIS — R7303 Prediabetes: Secondary | ICD-10-CM | POA: Insufficient documentation

## 2024-02-01 NOTE — Progress Notes (Signed)
 Patient was seen on 02/01/24 for the Core Session 15 of Diabetes Prevention Program course at [facility]. By the end of this session patients are able to complete the following objectives:   Learning Objectives: Explain how to prevent stress or cope with unavoidable stress.  Describe how this program can be a source of stress.  Explain how to manage stressful situations.  Create and follow an action plan for either preventing or coping with a stressful situation.   Goals:  Record weight taken outside of class.  Track foods and beverages eaten each day in the Food and Activity Tracker, including calories and fat grams for each item.   Track activity type, minutes you were active, and distance you reached each day in the Food and Activity Tracker.  Do your best to reach activity goal for the week. Follow your action plan to reduce stress.  Answer questions on handout regarding success of action plan.   Follow-Up Plan: Attend Core Session 16 next week.  Bring completed Food and Activity Tracker next week to be reviewed by Lifestyle Coach.

## 2024-02-05 DIAGNOSIS — F3342 Major depressive disorder, recurrent, in full remission: Secondary | ICD-10-CM | POA: Diagnosis not present

## 2024-02-15 ENCOUNTER — Encounter: Payer: Medicare PPO | Admitting: Dietician

## 2024-02-29 ENCOUNTER — Encounter: Payer: Self-pay | Admitting: Dietician

## 2024-02-29 ENCOUNTER — Encounter: Attending: Family Medicine | Admitting: Dietician

## 2024-02-29 DIAGNOSIS — R7303 Prediabetes: Secondary | ICD-10-CM | POA: Insufficient documentation

## 2024-02-29 NOTE — Progress Notes (Signed)
 Patient was seen on 02/29/24 for Session 17 of Diabetes Prevention Program course at [facility]. By the end of this session patients are able to complete the following objectives:   Learning Objectives: Identify how to maintain and/or continue working toward program goals for the remainder of the program.  Describe ways that food and activity tracking can assist them in maintaining/reaching program goals.  Identify progress they have made since the beginning of the program.   Goals:  Record weight taken outside of class.  Track foods and beverages eaten each day in the Food and Activity Tracker, including calories and fat grams for each item.   Track activity type, minutes you were active, and distance you reached each day in the Food and Activity Tracker.   Follow-Up Plan: Attend session 18 in two weeks.  Bring completed Food and Activity Trackers next session to be reviewed by Lifestyle Coach.

## 2024-03-05 ENCOUNTER — Encounter: Payer: Self-pay | Admitting: Podiatry

## 2024-03-05 ENCOUNTER — Ambulatory Visit: Admitting: Podiatry

## 2024-03-05 VITALS — Ht 63.0 in | Wt 153.2 lb

## 2024-03-05 DIAGNOSIS — L600 Ingrowing nail: Secondary | ICD-10-CM

## 2024-03-05 NOTE — Progress Notes (Signed)
   Chief Complaint  Patient presents with   Nail Problem    Pt is here due to right great toenail, she states that she has had the toenail removed before and it has grew back thick and discolored, toenail looks like it is separating from the base, she is thinking that it may need to come off again.    Subjective: Patient presents today for evaluation of pain to the right hallux nail plate.  She has a history of injuring the toenail around 2022.  Since that time the nail has grown back thick and dystrophic.  It is very sensitive to touch.  It has been removed temporarily in the past but it regrew thick and tender  Past Medical History:  Diagnosis Date   Depression    Hyperlipidemia    Migraines    Rectal bleeding 05/26/2020   Squamous cell carcinoma 09/20/2017   chest    Past Surgical History:  Procedure Laterality Date   DILATION AND CURETTAGE OF UTERUS     x2   KNEE ARTHROSCOPY Left    laser vein surgery     Left hand surgery Left    Thumb   TONSILLECTOMY      No Known Allergies  Objective:  General: Well developed, nourished, in no acute distress, alert and oriented x3   Dermatology: Skin is warm, dry and supple bilateral.  Right hallux nail plate is tender with evidence of an ingrowing nail and nail dystrophy. Pain on palpation noted to the border of the nail fold. The remaining nails appear unremarkable at this time.   Vascular: DP and PT pulses palpable.  No clinical evidence of vascular compromise  Neruologic: Grossly intact via light touch bilateral.  Musculoskeletal: No pedal deformity noted  Assesement: #1  Nail dystrophy with associated tenderness right hallux nail plate  Plan of Care:  -Patient evaluated.  -Discussed treatment alternatives and plan of care. Explained nail avulsion procedure and post procedure course to patient. -Patient opted for permanent partial nail avulsion of the ingrown portion of the nail.  -Prior to procedure, local anesthesia  infiltration utilized using 3 ml of a 50:50 mixture of 2% plain lidocaine and 0.5% plain marcaine in a normal hallux block fashion and a betadine prep performed.  -Partial permanent nail avulsion with chemical matrixectomy performed using 3x30sec applications of phenol followed by alcohol flush.  -Light dressing applied.  Post care instructions provided -Return to clinic 3 weeks  Thresa EMERSON Sar, DPM [facility]  Dr. Thresa EMERSON Sar, DPM    [address] [place], [place] [address]                Office [phone]  Fax [phone]

## 2024-03-05 NOTE — Patient Instructions (Signed)

## 2024-03-26 DIAGNOSIS — H5213 Myopia, bilateral: Secondary | ICD-10-CM | POA: Diagnosis not present

## 2024-03-26 DIAGNOSIS — H02403 Unspecified ptosis of bilateral eyelids: Secondary | ICD-10-CM | POA: Diagnosis not present

## 2024-03-26 DIAGNOSIS — H2513 Age-related nuclear cataract, bilateral: Secondary | ICD-10-CM | POA: Diagnosis not present

## 2024-03-28 ENCOUNTER — Encounter: Attending: Family Medicine | Admitting: Dietician

## 2024-03-28 ENCOUNTER — Encounter: Payer: Self-pay | Admitting: Dietician

## 2024-03-28 DIAGNOSIS — R7303 Prediabetes: Secondary | ICD-10-CM | POA: Diagnosis not present

## 2024-03-28 NOTE — Progress Notes (Signed)
  Learning Objectives: Explain how glucose is used in the body and it's relationship with insulin/insulin resistance.  Identify symptoms of diabetes.  Describe lab tests used to diagnose diabetes.  Describe health complications and conditions related to diabetes.   Goals:  Record weight taken outside of class.  Track foods and beverages eaten each day in the Food and Activity Tracker, including calories and fat grams for each item.   Track activity type, minutes you were active, and distance you reached each day in the Food and Activity Tracker.   Follow-Up Plan: Attend next session.  Email completed Food and Activity Trackers before next session to be reviewed by Lifestyle Coach.

## 2024-03-30 ENCOUNTER — Other Ambulatory Visit: Payer: Self-pay | Admitting: Pulmonary Disease

## 2024-04-02 ENCOUNTER — Encounter: Payer: Self-pay | Admitting: Podiatry

## 2024-04-02 ENCOUNTER — Ambulatory Visit: Admitting: Podiatry

## 2024-04-02 VITALS — Ht 63.0 in | Wt 153.2 lb

## 2024-04-02 DIAGNOSIS — L600 Ingrowing nail: Secondary | ICD-10-CM

## 2024-04-02 NOTE — Progress Notes (Addendum)
   Chief Complaint  Patient presents with   Nail Problem    Pt is here to f/u on right great toenail, she states no pain, swelling or drainage to the area, has no complaints.    Subjective: 73 y.o. female presents today status post permanent nail avulsion procedure of the right hallux nail plate that was performed on 03/05/2024.  Doing well.  No pain.  No complaints.   Past Medical History:  Diagnosis Date   Depression    Hyperlipidemia    Migraines    Rectal bleeding 05/26/2020   Squamous cell carcinoma 09/20/2017   chest    Objective: Neurovascular status intact.  Skin is warm, dry and supple.  Nailbed appears to be healing routinely  Assessment: #1 s/p total permanent nail avulsion right hallux nail plate   Plan of care: -Patient was evaluated  -Light debridement of the periungual debris was performed to the border of the respective toe  -Continue Neosporin and a Band-Aid during the day until it is completely healed and resolved -Patient is to return to clinic on a PRN basis.   Thresa EMERSON Sar, DPM [facility]  Dr. Thresa EMERSON Sar, DPM    [address] [place], [place] [address]                Office [phone]  Fax [phone]

## 2024-04-04 DIAGNOSIS — D225 Melanocytic nevi of trunk: Secondary | ICD-10-CM | POA: Diagnosis not present

## 2024-04-04 DIAGNOSIS — Z85820 Personal history of malignant melanoma of skin: Secondary | ICD-10-CM | POA: Diagnosis not present

## 2024-04-04 DIAGNOSIS — L814 Other melanin hyperpigmentation: Secondary | ICD-10-CM | POA: Diagnosis not present

## 2024-04-04 DIAGNOSIS — Z08 Encounter for follow-up examination after completed treatment for malignant neoplasm: Secondary | ICD-10-CM | POA: Diagnosis not present

## 2024-04-04 DIAGNOSIS — L821 Other seborrheic keratosis: Secondary | ICD-10-CM | POA: Diagnosis not present

## 2024-04-23 ENCOUNTER — Ambulatory Visit: Admitting: *Deleted

## 2024-04-23 VITALS — Ht 63.5 in | Wt 144.0 lb

## 2024-04-23 DIAGNOSIS — Z0000 Encounter for general adult medical examination without abnormal findings: Secondary | ICD-10-CM | POA: Diagnosis not present

## 2024-04-23 NOTE — Progress Notes (Signed)
 Subjective:   Amy Perkins is a 73 y.o. female who presents for Medicare Annual (Subsequent) preventive examination.  Visit Complete: Virtual I connected with  Amy Perkins on 04/23/24 by a audio enabled telemedicine application and verified that I am speaking with the correct person using two identifiers.  Patient Location: Home  Provider Location: Home Office  I discussed the limitations of evaluation and management by telemedicine. The patient expressed understanding and agreed to proceed.  Vital Signs: Because this visit was a virtual/telehealth visit, some criteria may be missing or patient reported. Any vitals not documented were not able to be obtained and vitals that have been documented are patient reported.  Patient Medicare AWV questionnaire was completed by the patient on 04-19-2024; I have confirmed that all information answered by patient is correct and no changes since this date.  Cardiac Risk Factors include: advanced age (>67men, >29 women);obesity (BMI >30kg/m2)     Objective:    Today's Vitals   04/23/24 0933  Weight: 144 lb (65.3 kg)  Height: 5' 3.5 (1.613 m)   Body mass index is 25.11 kg/m.     04/23/2024    9:34 AM 09/27/2023    9:49 AM 02/08/2023   11:11 AM 09/26/2022   10:01 AM 02/02/2022    8:46 AM 09/26/2021   10:10 AM 11/29/2020    9:24 AM  Advanced Directives  Does Patient Have a Medical Advance Directive? Yes Yes Yes Yes Yes No Yes  Type of Estate agent of [employer] Power of [place];Living will Healthcare Power of [employer] of [place];Living will Healthcare Power of [place];Living will  Healthcare Power of [place];Living will  Does patient want to make changes to medical advance directive?    No - Patient declined     Copy of Healthcare Power of Attorney in Chart? Yes - validated most recent copy scanned in chart (See row information) No - copy requested Yes - validated most recent copy scanned in  chart (See row information) No - copy requested No - copy requested  Yes - validated most recent copy scanned in chart (See row information)  Would patient like information on creating a medical advance directive?      No - Patient declined     Current Medications (verified) Outpatient Encounter Medications as of 04/23/2024  Medication Sig   ALPRAZolam  (XANAX ) 0.5 MG tablet Take 0.5 mg by mouth See admin instructions. Take 1/2 tablet by mouth 2 times daily (morning and 3PM) , and 1 tablet at bedtime.   apixaban  (ELIQUIS ) 5 MG TABS tablet Take 1 tablet (5 mg total) by mouth 2 (two) times daily.   citalopram  (CELEXA ) 40 MG tablet Take 1 tablet (40 mg total) by mouth daily.   levothyroxine  (SYNTHROID ) 50 MCG tablet TAKE 1 TABLET BY MOUTH EVERY DAY   omeprazole  (PRILOSEC) 20 MG capsule TAKE 1 CAPSULE BY MOUTH TWICE A DAY   simvastatin  (ZOCOR ) 40 MG tablet TAKE 1 TABLET BY MOUTH EVERYDAY AT BEDTIME   SYMBICORT  160-4.5 MCG/ACT inhaler INHALE 2 PUFFS INTO THE LUNGS TWICE A DAY   No facility-administered encounter medications on file as of 04/23/2024.    Allergies (verified) Patient has no known allergies.   History: Past Medical History:  Diagnosis Date   Depression    Hyperlipidemia    Migraines    Rectal bleeding 05/26/2020   Squamous cell carcinoma 09/20/2017   chest   Past Surgical History:  Procedure Laterality Date   DILATION AND CURETTAGE  OF UTERUS     x2   KNEE ARTHROSCOPY Left    laser vein surgery     Left hand surgery Left    Thumb   TONSILLECTOMY     Family History  Problem Relation Age of Onset   Coronary artery disease Father    Stroke Father    Parkinson's disease Father    Hypertension Mother    Diabetes Mother    Stroke Mother    Coronary artery disease Brother    Factor V Leiden deficiency Daughter    Heart disease Paternal Grandfather    Colon cancer Neg Hx    Esophageal cancer Neg Hx    Pancreatic cancer Neg Hx    Stomach cancer Neg Hx    Rectal cancer  Neg Hx    Social History   Socioeconomic History   Marital status: Divorced    Spouse name: Not on file   Number of children: 2   Years of education: college   Highest education level: Master's degree (e.g., MA, MS, MEng, MEd, MSW, MBA)  Occupational History   Occupation: Magazine features editor: [employer]    Comment: [employer]  Tobacco Use   Smoking status: Never   Smokeless tobacco: Never  Vaping Use   Vaping status: Never Used  Substance and Sexual Activity   Alcohol use: Yes    Alcohol/week: 4.0 standard drinks of alcohol    Types: 4 Glasses of wine per week    Comment: 4 glass week   Drug use: No   Sexual activity: Never  Other Topics Concern   Not on file  Social History Narrative   Divorced, both children live locally.   Patient works part time at [employer]. College education.   Left handed.   Caffeine- two cups coffee daily.   Social Drivers of Health   Financial Resource Strain: Medium Risk (04/23/2024)   Overall Financial Resource Strain (CARDIA)    Difficulty of Paying Living Expenses: Somewhat hard  Food Insecurity: No Food Insecurity (04/23/2024)   Hunger Vital Sign    Worried About Running Out of Food in the Last Year: Never true    Ran Out of Food in the Last Year: Never true  Transportation Needs: No Transportation Needs (04/23/2024)   PRAPARE - Administrator, Civil Service (Medical): No    Lack of Transportation (Non-Medical): No  Physical Activity: Sufficiently Active (04/23/2024)   Exercise Vital Sign    Days of Exercise per Week: 7 days    Minutes of Exercise per Session: 60 min  Stress: No Stress Concern Present (04/23/2024)   [employer] of Occupational Health - Occupational Stress Questionnaire    Feeling of Stress: Not at all  Social Connections: Moderately Integrated (04/23/2024)   Social Connection and Isolation Panel    Frequency of Communication with Friends and Family: More than three times a week    Frequency of Social  Gatherings with Friends and Family: Three times a week    Attends Religious Services: More than 4 times per year    Active Member of Clubs or Organizations: Yes    Attends Engineer, structural: More than 4 times per year    Marital Status: Divorced    Tobacco Counseling Counseling given: Not Answered   Clinical Intake:  Pre-visit preparation completed: Yes  Pain : No/denies pain     Diabetes: No  How often do you need to have someone help you when you read instructions, pamphlets, or  other written materials from your doctor or pharmacy?: 1 - Never  Interpreter Needed?: No  Information entered by :: Mliss Graff LPN   Activities of Daily Living    04/23/2024    9:36 AM 04/19/2024    9:36 AM  In your present state of health, do you have any difficulty performing the following activities:  Hearing? 0 0  Vision? 0 0  Difficulty concentrating or making decisions? 0 0  Walking or climbing stairs? 0 0  Dressing or bathing? 0 0  Doing errands, shopping? 0 0  Preparing Food and eating ? N N  Using the Toilet? N N  In the past six months, have you accidently leaked urine? N N  Do you have problems with loss of bowel control? N N  Managing your Medications? N N  Managing your Finances? N N  Housekeeping or managing your Housekeeping? N N    Patient Care Team: Mahlon Comer BRAVO, MD as PCP - General Kristie Lamprey, MD as Consulting Physician (Gastroenterology) Onita Duos, MD as Consulting Physician (Neurology) Mat Browning, MD as Consulting Physician (Obstetrics and Gynecology) [facility] Leslee Reusing, MD as Consulting Physician (Ophthalmology) Carolee Acosta (Dentistry) Elnor Fess (Dermatology) Maryelizabeth Onita, MD (Inactive) as Consulting Physician (Hematology) Sebastian Lenis, MD as Consulting Physician (Orthopedic Surgery)  Indicate any recent Medical Services you may have received from other than [facility] providers in the past year (date may be  approximate).     Assessment:   This is a routine wellness examination for Alynna.  Hearing/Vision screen Hearing Screening - Comments:: Some hearing loss No hearing aids  Vision Screening - Comments:: Up to date [place]   Goals Addressed             This Visit's Progress    Patient Stated       Stay  healthy       Depression Screen    04/23/2024    9:36 AM 06/12/2023    9:28 AM 02/08/2023   11:13 AM 12/11/2022    8:37 AM 06/07/2022    8:20 AM 02/02/2022    8:47 AM 02/02/2022    8:45 AM  PHQ 2/9 Scores  PHQ - 2 Score 0 0 0 0 0 0 0  PHQ- 9 Score 0 0 0 2       Fall Risk    04/23/2024    9:32 AM 04/19/2024    9:36 AM 12/11/2023    9:03 AM 07/09/2023   10:39 AM 06/12/2023    9:28 AM  Fall Risk   Falls in the past year? 0 0 1 0 0  Number falls in past yr: 0 0 0 0 0  Injury with Fall? 0 0 1 0 0  Risk for fall due to :    No Fall Risks No Fall Risks  Follow up Falls evaluation completed;Education provided;Falls prevention discussed   Falls evaluation completed Falls evaluation completed    MEDICARE RISK AT HOME: Medicare Risk at Home Any stairs in or around the home?: No If so, are there any without handrails?: No Home free of loose throw rugs in walkways, pet beds, electrical cords, etc?: Yes Adequate lighting in your home to reduce risk of falls?: Yes Life alert?: No Use of a cane, walker or w/c?: No Grab bars in the bathroom?: No Shower chair or bench in shower?: Yes Elevated toilet seat or a handicapped toilet?: No  TIMED UP AND GO:  Was the test performed?  No  Cognitive Function:    10/16/2018    9:26 AM 10/10/2017   10:26 AM  MMSE - Mini Mental State Exam  Orientation to time 5 5   Orientation to Place 5 5   Registration 3 3   Attention/ Calculation 5 5   Recall 3 3   Language- name 2 objects 2 2   Language- repeat 1 1  Language- follow 3 step command 3 3   Language- read & follow direction 1 1   Write a sentence 1 1   Copy design 1 1   Total  score 30 30      Data saved with a previous flowsheet row definition        04/23/2024    9:35 AM 02/08/2023   11:09 AM 11/26/2019    9:19 AM  6CIT Screen  What Year? 0 points 0 points 0 points  What month? 0 points 0 points 0 points  What time? 0 points 0 points 0 points  Count back from 20 0 points 0 points 0 points  Months in reverse 0 points 0 points 0 points  Repeat phrase 0 points 0 points 0 points  Total Score 0 points 0 points 0 points    Immunizations Immunization History  Administered Date(s) Administered   Fluad Quad(high Dose 65+) 06/01/2022   H1N1 09/30/2008   INFLUENZA, HIGH DOSE SEASONAL PF 05/14/2020, 04/26/2023   Influenza Split 06/28/2011, 04/26/2023   Influenza Whole 05/06/2010   Influenza,inj,Quad PF,6+ Mos 07/08/2013, 07/03/2014, 06/05/2016, 04/13/2017, 04/08/2018, 04/15/2019   Influenza-Unspecified 06/22/2015, 05/27/2021, 05/21/2022, 04/22/2023   Moderna Covid-19 Fall Seasonal Vaccine 20yrs & older 04/26/2023   Moderna Covid-19 Vaccine Bivalent Booster 49yrs & up 04/27/2023   PFIZER Comirnaty(Gray Top)Covid-19 Tri-Sucrose Vaccine 01/03/2021   PFIZER(Purple Top)SARS-COV-2 Vaccination 09/27/2019, 10/22/2019, 05/24/2020   Pfizer Covid-19 Vaccine Bivalent Booster 55yrs & up 05/27/2021, 06/01/2022   Pfizer(Comirnaty)Fall Seasonal Vaccine 12 years and older 06/01/2022   Pneumococcal Conjugate-13 04/01/2015   Pneumococcal Polysaccharide-23 04/03/2016   Td 05/22/2005   Tdap 06/28/2011, 05/31/2023   Unspecified SARS-COV-2 Vaccination 04/22/2023   Zoster, Live 01/14/2014    TDAP status: Up to date  Flu Vaccine status: Due, Education has been provided regarding the importance of this vaccine. Advised may receive this vaccine at local pharmacy or Health Dept. Aware to provide a copy of the vaccination record if obtained from local pharmacy or Health Dept. Verbalized acceptance and understanding.  Pneumococcal vaccine status: Up to date  Covid-19 vaccine status:  Information provided on how to obtain vaccines.   Qualifies for Shingles Vaccine? Yes   Zostavax completed No   Shingrix Completed?: No.    Education has been provided regarding the importance of this vaccine. Patient has been advised to call insurance company to determine out of pocket expense if they have not yet received this vaccine. Advised may also receive vaccine at local pharmacy or Health Dept. Verbalized acceptance and understanding.  Screening Tests Health Maintenance  Topic Date Due   Zoster Vaccines- Shingrix (1 of 2) 02/12/2000   INFLUENZA VACCINE  03/21/2024   COVID-19 Vaccine (8 - 2024-25 season) 04/21/2024   MAMMOGRAM  06/25/2024   Medicare Annual Wellness (AWV)  04/23/2025   Colonoscopy  08/04/2027   DTaP/Tdap/Td (4 - Td or Tdap) 05/30/2033   Pneumococcal Vaccine: 50+ Years  Completed   DEXA SCAN  Completed   Hepatitis C Screening  Completed   HPV VACCINES  Aged Out   Meningococcal B Vaccine  Aged Out    Health Maintenance  Health Maintenance Due  Topic Date Due   Zoster Vaccines- Shingrix (1 of 2) 02/12/2000   INFLUENZA VACCINE  03/21/2024   COVID-19 Vaccine (8 - 2024-25 season) 04/21/2024    Colorectal cancer screening: Type of screening: Colonoscopy. Completed 2021. Repeat every 7 years  Mammogram status: Completed  . Repeat every year  Bone Density status: Completed 2024. Results reflect: Bone density results: NORMAL. Repeat every 5 years.  Lung Cancer Screening: (Low Dose CT Chest recommended if Age 53-80 years, 20 pack-year currently smoking OR have quit w/in 15years.) does not qualify.   Lung Cancer Screening Referral:   Additional Screening:  Hepatitis C Screening: does not qualify; Completed 2020  Vision Screening: Recommended annual ophthalmology exams for early detection of glaucoma and other disorders of the eye. Is the patient up to date with their annual eye exam?  Yes  Who is the provider or what is the name of the office in which the  patient attends annual eye exams? [facility] If pt is not established with a provider, would they like to be referred to a provider to establish care? No .   Dental Screening: Recommended annual dental exams for proper oral hygiene   Community Resource Referral / Chronic Care Management: CRR required this visit?  No   CCM required this visit?  No     Plan:     I have personally reviewed and noted the following in the patient's chart:   Medical and social history Use of alcohol, tobacco or illicit drugs  Current medications and supplements including opioid prescriptions. Patient is not currently taking opioid prescriptions. Functional ability and status Nutritional status Physical activity Advanced directives List of other physicians Hospitalizations, surgeries, and ER visits in previous 12 months Vitals Screenings to include cognitive, depression, and falls Referrals and appointments  In addition, I have reviewed and discussed with patient certain preventive protocols, quality metrics, and best practice recommendations. A written personalized care plan for preventive services as well as general preventive health recommendations were provided to patient.     Mliss Graff, LPN   [id number]   After Visit Summary: (MyChart) Due to this being a telephonic visit, the after visit summary with patients personalized plan was offered to patient via MyChart   Nurse Notes:

## 2024-04-23 NOTE — Patient Instructions (Signed)
 Ms. Weis , Thank you for taking time to come for your Medicare Wellness Visit. I appreciate your ongoing commitment to your health goals. Please review the following plan we discussed and let me know if I can assist you in the future.   Screening recommendations/referrals: Colonoscopy: up to date Mammogram: up to date Bone Density: up to date Recommended yearly ophthalmology/optometry visit for glaucoma screening and checkup Recommended yearly dental visit for hygiene and checkup  Vaccinations: Influenza vaccine: Education provided Pneumococcal vaccine: up to date Tdap vaccine: up to date Shingles vaccine: Education provided      Preventive Care 65 Years and Older, Female Preventive care refers to lifestyle choices and visits with your health care provider that can promote health and wellness. What does preventive care include? A yearly physical exam. This is also called an annual well check. Dental exams once or twice a year. Routine eye exams. Ask your health care provider how often you should have your eyes checked. Personal lifestyle choices, including: Daily care of your teeth and gums. Regular physical activity. Eating a healthy diet. Avoiding tobacco and drug use. Limiting alcohol use. Practicing safe sex. Taking low-dose aspirin every day. Taking vitamin and mineral supplements as recommended by your health care provider. What happens during an annual well check? The services and screenings done by your health care provider during your annual well check will depend on your age, overall health, lifestyle risk factors, and family history of disease. Counseling  Your health care provider may ask you questions about your: Alcohol use. Tobacco use. Drug use. Emotional well-being. Home and relationship well-being. Sexual activity. Eating habits. History of falls. Memory and ability to understand (cognition). Work and work Astronomer. Reproductive  health. Screening  You may have the following tests or measurements: Height, weight, and BMI. Blood pressure. Lipid and cholesterol levels. These may be checked every 5 years, or more frequently if you are over 86 years old. Skin check. Lung cancer screening. You may have this screening every year starting at age 65 if you have a 30-pack-year history of smoking and currently smoke or have quit within the past 15 years. Fecal occult blood test (FOBT) of the stool. You may have this test every year starting at age 66. Flexible sigmoidoscopy or colonoscopy. You may have a sigmoidoscopy every 5 years or a colonoscopy every 10 years starting at age 27. Hepatitis C blood test. Hepatitis B blood test. Sexually transmitted disease (STD) testing. Diabetes screening. This is done by checking your blood sugar (glucose) after you have not eaten for a while (fasting). You may have this done every 1-3 years. Bone density scan. This is done to screen for osteoporosis. You may have this done starting at age 66. Mammogram. This may be done every 1-2 years. Talk to your health care provider about how often you should have regular mammograms. Talk with your health care provider about your test results, treatment options, and if necessary, the need for more tests. Vaccines  Your health care provider may recommend certain vaccines, such as: Influenza vaccine. This is recommended every year. Tetanus, diphtheria, and acellular pertussis (Tdap, Td) vaccine. You may need a Td booster every 10 years. Zoster vaccine. You may need this after age 60. Pneumococcal 13-valent conjugate (PCV13) vaccine. One dose is recommended after age 21. Pneumococcal polysaccharide (PPSV23) vaccine. One dose is recommended after age 31. Talk to your health care provider about which screenings and vaccines you need and how often you need them. This information is  not intended to replace advice given to you by your health care provider.  Make sure you discuss any questions you have with your health care provider. Document Released: 09/03/2015 Document Revised: 04/26/2016 Document Reviewed: 06/08/2015 Elsevier Interactive Patient Education  2017 [employer].  Fall Prevention in the Home Falls can cause injuries. They can happen to people of all ages. There are many things you can do to make your home safe and to help prevent falls. What can I do on the outside of my home? Regularly fix the edges of walkways and driveways and fix any cracks. Remove anything that might make you trip as you walk through a door, such as a raised step or threshold. Trim any bushes or trees on the path to your home. Use bright outdoor lighting. Clear any walking paths of anything that might make someone trip, such as rocks or tools. Regularly check to see if handrails are loose or broken. Make sure that both sides of any steps have handrails. Any raised decks and porches should have guardrails on the edges. Have any leaves, snow, or ice cleared regularly. Use sand or salt on walking paths during winter. Clean up any spills in your garage right away. This includes oil or grease spills. What can I do in the bathroom? Use night lights. Install grab bars by the toilet and in the tub and shower. Do not use towel bars as grab bars. Use non-skid mats or decals in the tub or shower. If you need to sit down in the shower, use a plastic, non-slip stool. Keep the floor dry. Clean up any water that spills on the floor as soon as it happens. Remove soap buildup in the tub or shower regularly. Attach bath mats securely with double-sided non-slip rug tape. Do not have throw rugs and other things on the floor that can make you trip. What can I do in the bedroom? Use night lights. Make sure that you have a light by your bed that is easy to reach. Do not use any sheets or blankets that are too big for your bed. They should not hang down onto the floor. Have a  firm chair that has side arms. You can use this for support while you get dressed. Do not have throw rugs and other things on the floor that can make you trip. What can I do in the kitchen? Clean up any spills right away. Avoid walking on wet floors. Keep items that you use a lot in easy-to-reach places. If you need to reach something above you, use a strong step stool that has a grab bar. Keep electrical cords out of the way. Do not use floor polish or wax that makes floors slippery. If you must use wax, use non-skid floor wax. Do not have throw rugs and other things on the floor that can make you trip. What can I do with my stairs? Do not leave any items on the stairs. Make sure that there are handrails on both sides of the stairs and use them. Fix handrails that are broken or loose. Make sure that handrails are as long as the stairways. Check any carpeting to make sure that it is firmly attached to the stairs. Fix any carpet that is loose or worn. Avoid having throw rugs at the top or bottom of the stairs. If you do have throw rugs, attach them to the floor with carpet tape. Make sure that you have a light switch at the top of the  stairs and the bottom of the stairs. If you do not have them, ask someone to add them for you. What else can I do to help prevent falls? Wear shoes that: Do not have high heels. Have rubber bottoms. Are comfortable and fit you well. Are closed at the toe. Do not wear sandals. If you use a stepladder: Make sure that it is fully opened. Do not climb a closed stepladder. Make sure that both sides of the stepladder are locked into place. Ask someone to hold it for you, if possible. Clearly mark and make sure that you can see: Any grab bars or handrails. First and last steps. Where the edge of each step is. Use tools that help you move around (mobility aids) if they are needed. These include: Canes. Walkers. Scooters. Crutches. Turn on the lights when you  go into a dark area. Replace any light bulbs as soon as they burn out. Set up your furniture so you have a clear path. Avoid moving your furniture around. If any of your floors are uneven, fix them. If there are any pets around you, be aware of where they are. Review your medicines with your doctor. Some medicines can make you feel dizzy. This can increase your chance of falling. Ask your doctor what other things that you can do to help prevent falls. This information is not intended to replace advice given to you by your health care provider. Make sure you discuss any questions you have with your health care provider. Document Released: 06/03/2009 Document Revised: 01/13/2016 Document Reviewed: 09/11/2014 Elsevier Interactive Patient Education  2017 [employer].

## 2024-04-28 ENCOUNTER — Other Ambulatory Visit: Payer: Self-pay | Admitting: Family Medicine

## 2024-05-02 ENCOUNTER — Encounter: Attending: Family Medicine | Admitting: Dietician

## 2024-05-02 ENCOUNTER — Encounter: Payer: Self-pay | Admitting: Dietician

## 2024-05-02 DIAGNOSIS — R7303 Prediabetes: Secondary | ICD-10-CM | POA: Diagnosis not present

## 2024-05-02 NOTE — Progress Notes (Signed)
 Patient was seen on 05/02/24 for the Diabetes Prevention Program course at [facility]. By the end of this session patients are able to complete the following objectives:   Learning Objectives: Describe the differences between unsaturated, saturated, and trans fat on heart health.  List dietary sources of unsaturated, saturated, and trans fats. Explain ways to reduce intake of saturated fat and replace them with heart healthy fats.  Goals:  Record weight taken outside of class.  Track foods and beverages eaten each day in the Food and Activity Tracker, including calories and fat grams for each item.   Track activity type, minutes you were active, and distance you reached each day in the Food and Activity Tracker.   Follow-Up Plan: Attend next session.  Bring completed Food and Activity Trackers to next session to be reviewed by Lifestyle Coach.

## 2024-05-13 DIAGNOSIS — N760 Acute vaginitis: Secondary | ICD-10-CM | POA: Diagnosis not present

## 2024-05-30 ENCOUNTER — Encounter: Payer: Self-pay | Admitting: Dietician

## 2024-05-30 ENCOUNTER — Encounter: Attending: Family Medicine | Admitting: Dietician

## 2024-05-30 DIAGNOSIS — R7303 Prediabetes: Secondary | ICD-10-CM | POA: Insufficient documentation

## 2024-05-30 NOTE — Progress Notes (Signed)
 Patient was seen on 05/30/24 for the Diabetes Prevention Program course at [facility]. By the end of this session patients are able to complete the following objectives:   Learning Objectives: List risk factors for heart disease.  Define the difference between HDL and LDL cholesterol List ways to reduce risk for heart disease.   Goals:  Record weight taken outside of class.  Track foods and beverages eaten each day in the Food and Activity Tracker, including calories and fat grams for each item.   Track activity type, minutes you were active, and distance you reached each day in the Food and Activity Tracker.   Follow-Up Plan: Attend next session.  Bring completed Food and Activity Trackers to next session to be reviewed by Lifestyle Coach.

## 2024-06-11 ENCOUNTER — Ambulatory Visit: Admitting: Family Medicine

## 2024-06-11 ENCOUNTER — Encounter: Payer: Self-pay | Admitting: Family Medicine

## 2024-06-11 VITALS — BP 110/64 | HR 62 | Temp 97.9°F | Ht 63.5 in | Wt 147.1 lb

## 2024-06-11 DIAGNOSIS — E782 Mixed hyperlipidemia: Secondary | ICD-10-CM | POA: Diagnosis not present

## 2024-06-11 DIAGNOSIS — R7303 Prediabetes: Secondary | ICD-10-CM | POA: Diagnosis not present

## 2024-06-11 DIAGNOSIS — E038 Other specified hypothyroidism: Secondary | ICD-10-CM | POA: Diagnosis not present

## 2024-06-11 LAB — CBC WITH DIFFERENTIAL/PLATELET
Basophils Absolute: 0 K/uL (ref 0.0–0.1)
Basophils Relative: 0.8 % (ref 0.0–3.0)
Eosinophils Absolute: 0.1 K/uL (ref 0.0–0.7)
Eosinophils Relative: 2.2 % (ref 0.0–5.0)
HCT: 43.6 % (ref 36.0–46.0)
Hemoglobin: 14.7 g/dL (ref 12.0–15.0)
Lymphocytes Relative: 34.7 % (ref 12.0–46.0)
Lymphs Abs: 1.9 K/uL (ref 0.7–4.0)
MCHC: 33.7 g/dL (ref 30.0–36.0)
MCV: 91.6 fl (ref 78.0–100.0)
Monocytes Absolute: 0.4 K/uL (ref 0.1–1.0)
Monocytes Relative: 6.5 % (ref 3.0–12.0)
Neutro Abs: 3.1 K/uL (ref 1.4–7.7)
Neutrophils Relative %: 55.8 % (ref 43.0–77.0)
Platelets: 201 K/uL (ref 150.0–400.0)
RBC: 4.76 Mil/uL (ref 3.87–5.11)
RDW: 13.1 % (ref 11.5–15.5)
WBC: 5.5 K/uL (ref 4.0–10.5)

## 2024-06-11 LAB — BASIC METABOLIC PANEL WITH GFR
BUN: 17 mg/dL (ref 6–23)
CO2: 32 meq/L (ref 19–32)
Calcium: 9.6 mg/dL (ref 8.4–10.5)
Chloride: 101 meq/L (ref 96–112)
Creatinine, Ser: 0.7 mg/dL (ref 0.40–1.20)
GFR: 85.25 mL/min (ref >60.00)
Glucose, Bld: 97 mg/dL (ref 70–99)
Potassium: 3.8 meq/L (ref 3.5–5.1)
Sodium: 139 meq/L (ref 135–145)

## 2024-06-11 LAB — HEPATIC FUNCTION PANEL
ALT: 45 U/L — ABNORMAL HIGH (ref 0–35)
AST: 27 U/L (ref 0–37)
Albumin: 4.4 g/dL (ref 3.5–5.2)
Alkaline Phosphatase: 48 U/L (ref 39–117)
Bilirubin, Direct: 0.1 mg/dL (ref 0.0–0.3)
Total Bilirubin: 0.4 mg/dL (ref 0.2–1.2)
Total Protein: 6.4 g/dL (ref 6.0–8.3)

## 2024-06-11 LAB — LIPID PANEL
Cholesterol: 149 mg/dL (ref 0–200)
HDL: 49.3 mg/dL (ref >39.00)
LDL Cholesterol: 83 mg/dL (ref 0–99)
NonHDL: 99.78
Total CHOL/HDL Ratio: 3
Triglycerides: 82 mg/dL (ref 0.0–149.0)
VLDL: 16.4 mg/dL (ref 0.0–40.0)

## 2024-06-11 LAB — TSH: TSH: 1.83 u[IU]/mL (ref 0.35–5.50)

## 2024-06-11 LAB — HEMOGLOBIN A1C: Hgb A1c MFr Bld: 6.1 % (ref 4.6–6.5)

## 2024-06-11 NOTE — Progress Notes (Signed)
   Subjective:    Patient ID: Amy Perkins, female    DOB: [birth year], 73 y.o.   MRN: [id number]  HPI Hyperlipidemia- chronic problem, on Simvastatin  40mg  daily.  No CP, SOB, abd pain, N/V.  Hypothyroid- chronic problem, on Levothyroxine  50mcg daily.  No changes to skin/hair/nails  Prediabetes- last A1C 6.1%  Attempting to control w/ diet and exercise.  Down 6 lbs going to the gym.   Review of Systems For ROS see HPI     Objective:   Physical Exam Vitals reviewed.  Constitutional:      General: She is not in acute distress.    Appearance: Normal appearance. She is well-developed. She is not ill-appearing.  HENT:     Head: Normocephalic and atraumatic.  Eyes:     Conjunctiva/sclera: Conjunctivae normal.     Pupils: Pupils are equal, round, and reactive to light.  Neck:     Thyroid : No thyromegaly.  Cardiovascular:     Rate and Rhythm: Normal rate and regular rhythm.     Pulses: Normal pulses.     Heart sounds: Normal heart sounds. No murmur heard. Pulmonary:     Effort: Pulmonary effort is normal. No respiratory distress.     Breath sounds: Normal breath sounds.  Abdominal:     General: There is no distension.     Palpations: Abdomen is soft.     Tenderness: There is no abdominal tenderness.  Musculoskeletal:     Cervical back: Normal range of motion and neck supple.     Right lower leg: No edema.     Left lower leg: No edema.  Lymphadenopathy:     Cervical: No cervical adenopathy.  Skin:    General: Skin is warm and dry.  Neurological:     General: No focal deficit present.     Mental Status: She is alert and oriented to person, place, and time.  Psychiatric:        Mood and Affect: Mood normal.        Behavior: Behavior normal.        Thought Content: Thought content normal.           Assessment & Plan:

## 2024-06-11 NOTE — Assessment & Plan Note (Signed)
 Ongoing issue.  Last A1C 6.1%. is down 6 lbs since last visit w/ diet and exercise. Applauded her efforts.  Will continue to follow.

## 2024-06-11 NOTE — Assessment & Plan Note (Signed)
 Chronic problem.  On Simvastatin  40mg  daily w/o difficulty.  Check labs.  Adjust meds prn

## 2024-06-11 NOTE — Patient Instructions (Signed)
Schedule your complete physical in 6 months We'll notify you of your lab results and make any changes if needed Keep up the good work on healthy diet and regular exercise- you look great!! Call with any questions or concerns Stay Safe!  Stay Healthy! Happy Fall!! 

## 2024-06-11 NOTE — Assessment & Plan Note (Signed)
 Chronic problem. Doing well on Levothyroxine  50mcg daily.  Currently asymptomatic.  Check labs.  Adjust meds prn

## 2024-06-12 ENCOUNTER — Ambulatory Visit: Payer: Self-pay | Admitting: Family Medicine

## 2024-06-12 DIAGNOSIS — R7401 Elevation of levels of liver transaminase levels: Secondary | ICD-10-CM

## 2024-06-25 ENCOUNTER — Other Ambulatory Visit

## 2024-06-25 DIAGNOSIS — R7401 Elevation of levels of liver transaminase levels: Secondary | ICD-10-CM

## 2024-06-25 LAB — HEPATIC FUNCTION PANEL
ALT: 68 U/L — ABNORMAL HIGH (ref 0–35)
AST: 27 U/L (ref 0–37)
Albumin: 4.1 g/dL (ref 3.5–5.2)
Alkaline Phosphatase: 56 U/L (ref 39–117)
Bilirubin, Direct: 0 mg/dL (ref 0.0–0.3)
Total Bilirubin: 0.4 mg/dL (ref 0.2–1.2)
Total Protein: 6.2 g/dL (ref 6.0–8.3)

## 2024-07-02 ENCOUNTER — Ambulatory Visit: Payer: Self-pay | Admitting: Family Medicine

## 2024-07-02 DIAGNOSIS — R7401 Elevation of levels of liver transaminase levels: Secondary | ICD-10-CM

## 2024-07-02 NOTE — Telephone Encounter (Signed)
 Patient is questioning if not fasting could cause liver enzymes to be higher than previous readings?  She has not had any alcoholic beverages or tylenol  for 2 weeks.

## 2024-07-02 NOTE — Telephone Encounter (Signed)
 Copied from CRM [id number]. Topic: Clinical - Lab/Test Results >> Jul 02, 2024  1:19 PM Franky GRADE wrote: Reason for CRM: Patient had repeat labs on 06/25/2024 to recheck liver enzyme, she noticed they were higher than previous results, she did avoid alcohol and tylenol  for two weeks prior but did not fast and wanted to verify if that could be a reason why the results came back higher.

## 2024-07-03 NOTE — Progress Notes (Signed)
 Patient has viewed via Mychart  Labs placed , pt sent mychart to schedule lab visit

## 2024-07-04 ENCOUNTER — Encounter: Attending: Family Medicine | Admitting: Dietician

## 2024-07-04 ENCOUNTER — Encounter: Payer: Self-pay | Admitting: Dietician

## 2024-07-04 DIAGNOSIS — R7303 Prediabetes: Secondary | ICD-10-CM

## 2024-07-04 NOTE — Progress Notes (Signed)
 Start time: 1530 End time: 1630 Class size: 5  On 07/04/24 patient completed a post core session of the Diabetes Prevention Program course with [facility]. By the end of this session patients are able to complete the following objectives:   Learning Objectives: Describe the importance of having regular meals each day and how skipping meals can negatively affect food choices and weight.  Plan out balanced meals and snacks. List ways to avoid unplanned snacking.   Goals:  Record weight taken outside of class.  Track foods and beverages eaten each day in the Food and Activity Tracker, including calories and fat grams for each item.   Track activity type, minutes you were active, and distance you reached each day in the Food and Activity Tracker.   Follow-Up Plan: Attend next session.  Email completed Food and Activity Trackers before next session to be reviewed by Lifestyle Coach.

## 2024-07-08 DIAGNOSIS — Z1231 Encounter for screening mammogram for malignant neoplasm of breast: Secondary | ICD-10-CM | POA: Diagnosis not present

## 2024-07-08 DIAGNOSIS — Z124 Encounter for screening for malignant neoplasm of cervix: Secondary | ICD-10-CM | POA: Diagnosis not present

## 2024-07-14 ENCOUNTER — Other Ambulatory Visit: Payer: Self-pay | Admitting: Obstetrics and Gynecology

## 2024-07-14 ENCOUNTER — Other Ambulatory Visit (INDEPENDENT_AMBULATORY_CARE_PROVIDER_SITE_OTHER)

## 2024-07-14 DIAGNOSIS — R7401 Elevation of levels of liver transaminase levels: Secondary | ICD-10-CM | POA: Diagnosis not present

## 2024-07-14 DIAGNOSIS — R928 Other abnormal and inconclusive findings on diagnostic imaging of breast: Secondary | ICD-10-CM

## 2024-07-14 LAB — HEPATIC FUNCTION PANEL
ALT: 61 U/L — ABNORMAL HIGH (ref 0–35)
AST: 32 U/L (ref 0–37)
Albumin: 4.3 g/dL (ref 3.5–5.2)
Alkaline Phosphatase: 56 U/L (ref 39–117)
Bilirubin, Direct: 0.1 mg/dL (ref 0.0–0.3)
Total Bilirubin: 0.4 mg/dL (ref 0.2–1.2)
Total Protein: 6 g/dL (ref 6.0–8.3)

## 2024-07-15 ENCOUNTER — Ambulatory Visit: Payer: Self-pay | Admitting: Family Medicine

## 2024-07-15 DIAGNOSIS — R7401 Elevation of levels of liver transaminase levels: Secondary | ICD-10-CM

## 2024-07-15 NOTE — Progress Notes (Signed)
 Pt has reviewed via MyChart

## 2024-07-21 DIAGNOSIS — C50919 Malignant neoplasm of unspecified site of unspecified female breast: Secondary | ICD-10-CM

## 2024-07-21 HISTORY — DX: Malignant neoplasm of unspecified site of unspecified female breast: C50.919

## 2024-07-22 ENCOUNTER — Ambulatory Visit
Admission: RE | Admit: 2024-07-22 | Discharge: 2024-07-22 | Disposition: A | Source: Ambulatory Visit | Attending: Obstetrics and Gynecology | Admitting: Obstetrics and Gynecology

## 2024-07-22 ENCOUNTER — Other Ambulatory Visit: Payer: Self-pay | Admitting: Obstetrics and Gynecology

## 2024-07-22 DIAGNOSIS — R928 Other abnormal and inconclusive findings on diagnostic imaging of breast: Secondary | ICD-10-CM | POA: Diagnosis not present

## 2024-07-22 DIAGNOSIS — N6489 Other specified disorders of breast: Secondary | ICD-10-CM | POA: Diagnosis not present

## 2024-07-22 DIAGNOSIS — N6321 Unspecified lump in the left breast, upper outer quadrant: Secondary | ICD-10-CM

## 2024-07-25 ENCOUNTER — Other Ambulatory Visit

## 2024-07-31 ENCOUNTER — Ambulatory Visit (HOSPITAL_BASED_OUTPATIENT_CLINIC_OR_DEPARTMENT_OTHER): Admission: RE | Admit: 2024-07-31 | Discharge: 2024-07-31 | Attending: Family Medicine | Admitting: Family Medicine

## 2024-07-31 DIAGNOSIS — R7401 Elevation of levels of liver transaminase levels: Secondary | ICD-10-CM | POA: Insufficient documentation

## 2024-08-01 ENCOUNTER — Ambulatory Visit
Admission: RE | Admit: 2024-08-01 | Discharge: 2024-08-01 | Disposition: A | Source: Ambulatory Visit | Attending: Obstetrics and Gynecology | Admitting: Obstetrics and Gynecology

## 2024-08-01 ENCOUNTER — Inpatient Hospital Stay
Admission: RE | Admit: 2024-08-01 | Discharge: 2024-08-01 | Attending: Obstetrics and Gynecology | Admitting: Obstetrics and Gynecology

## 2024-08-01 ENCOUNTER — Encounter: Admitting: Dietician

## 2024-08-01 DIAGNOSIS — N6321 Unspecified lump in the left breast, upper outer quadrant: Secondary | ICD-10-CM

## 2024-08-01 DIAGNOSIS — R928 Other abnormal and inconclusive findings on diagnostic imaging of breast: Secondary | ICD-10-CM

## 2024-08-01 HISTORY — PX: BREAST BIOPSY: SHX20

## 2024-08-04 ENCOUNTER — Ambulatory Visit: Payer: Self-pay | Admitting: Family Medicine

## 2024-08-04 DIAGNOSIS — R7401 Elevation of levels of liver transaminase levels: Secondary | ICD-10-CM

## 2024-08-04 DIAGNOSIS — K8020 Calculus of gallbladder without cholecystitis without obstruction: Secondary | ICD-10-CM

## 2024-08-04 LAB — SURGICAL PATHOLOGY

## 2024-08-07 ENCOUNTER — Other Ambulatory Visit: Payer: Self-pay | Admitting: *Deleted

## 2024-08-08 ENCOUNTER — Encounter: Payer: Self-pay | Admitting: *Deleted

## 2024-08-08 ENCOUNTER — Encounter: Attending: Family Medicine | Admitting: Dietician

## 2024-08-08 ENCOUNTER — Encounter: Payer: Self-pay | Admitting: Dietician

## 2024-08-08 DIAGNOSIS — R7303 Prediabetes: Secondary | ICD-10-CM

## 2024-08-08 NOTE — Progress Notes (Signed)
 Established patient with new diagnosis of L breast IDC. Scheduled to see Dr Althia on 08/15/2024.  Reached out to Slater FORBES Sane to introduce myself as the office RN Navigator and explain our new patient process. Reviewed the reason for their referral and scheduled their new patient appointment along with labs. Provided address and directions to the office including call back phone number. Reviewed with patient any concerns they may have or any possible barriers to attending their appointment.   Informed patient about my role as a navigator and that I will meet with them prior to their New Patient appointment and more fully discuss what services I can provide. At this time patient has no further questions or needs.    Oncology Nurse Navigator Documentation     08/08/2024    1:15 PM  Oncology Nurse Navigator Flowsheets  Abnormal Finding Date 07/22/2024  Confirmed Diagnosis Date 08/01/2024  Diagnosis Status Confirmed Diagnosis Complete  Navigator Follow Up Date: 08/18/2024  Navigator Follow Up Reason: New Patient Appointment  Navigator Location [facility]  Referral Date to RadOnc/MedOnc 08/08/2024  Navigator Encounter Type Introductory Phone Call  Patient Visit Type MedOnc  Treatment Phase Pre-Tx/Tx Discussion  Barriers/Navigation Needs Coordination of Care;Education  Education Other  Interventions Coordination of Care;Education  Acuity Level 2-Minimal Needs (1-2 Barriers Identified)  Coordination of Care Appts  Education Method Verbal  Time Spent with Patient 30

## 2024-08-08 NOTE — Progress Notes (Signed)
 Class start Time: 1530   Class End Time: 1630  This was a class of 5 patients.  Patient was seen on 08/08/24 for the Diabetes Prevention Program course at [facility]. By the end of this session patients are able to complete the following objectives:   Learning Objectives: Counter self-defeating thoughts with positive self-statements Define assertiveness.  List examples of ways to practice assertiveness.   Goals:  Record weight taken outside of class.  Track foods and beverages eaten each day in the Food and Activity Tracker, including calories and fat grams for each item.   Track activity type, minutes you were active, and distance you reached each day in the Food and Activity Tracker.   Follow-Up Plan: Attend next session.  Bring completed Food and Activity Trackers to next session to be reviewed by Lifestyle Coach.

## 2024-08-15 ENCOUNTER — Other Ambulatory Visit: Payer: Self-pay | Admitting: Surgery

## 2024-08-15 DIAGNOSIS — Z853 Personal history of malignant neoplasm of breast: Secondary | ICD-10-CM

## 2024-08-15 NOTE — Progress Notes (Signed)
 "   REFERRING PHYSICIAN:  Mat Rosaline CROME, MD PROVIDER:  VICENTA DASIE POLI, MD MRN: [id number] DOB: 05-17-1951 DATE OF ENCOUNTER: 08/15/2024 Subjective    Chief Complaint: New Consultation (Left breast cancer/gallstones)   History of Present Illness: Amy Perkins is a 73 y.o. female who is seen today as an office consultation for evaluation of New Consultation (Left breast cancer/gallstones)   This is a 73 year old female is referred here for the recent diagnosis of left breast cancer.  She is found to have a small mass in the left breast.  On ultrasound it measured 9 mm.  It was at the 2 o'clock position 3 cm from the nipple.  Biopsy of the mass showed invasive ductal carcinoma.  It was 100% ER positive, 30% PR positive, HER2 negative, and had a Ki-67 of 10%.  She has factor V and factor X Leiden deficiency and is on Eliquis .  She is followed by Dr. Timmy for this and he will be seeing her for the breast cancer as well.  She has had no previous problems regarding her breast.  There is no family history of breast cancer. She has also been recently found to have an elevated ALT on several labs.  She has an ultrasound showing her to have gallstones.  She reports she may have had an attack from her gallbladder several years ago which was worked up as a cardiac issue and she has had known gallstones apparently.  She currently is pain-free regarding her abdomen.    Review of Systems: A complete review of systems was obtained from the patient.  I have reviewed this information and discussed as appropriate with the patient.  See HPI as well for other ROS.  ROS   Medical History: Past Medical History:  Diagnosis Date   Anxiety    DVT (deep venous thrombosis) (CMS/HHS-HCC)    History of cancer    Pulmonary embolism (CMS/HHS-HCC)    Thyroid  disease     There is no problem list on file for this patient.   Past Surgical History:  Procedure Laterality Date   DILATION AND  CURETTAGE, DIAGNOSTIC / THERAPEUTIC     knee surgery     thumb surgery     TONSILLECTOMY       No Known Allergies  Current Outpatient Medications on File Prior to Visit  Medication Sig Dispense Refill   ALPRAZolam  (XANAX ) 0.5 MG tablet Take 0.5 mg by mouth     budesonide -formoteroL  (SYMBICORT ) 160-4.5 mcg/actuation inhaler Inhale 2 inhalations into the lungs 2 (two) times daily     citalopram  (CELEXA ) 40 MG tablet Take 40 mg by mouth once daily     ELIQUIS  5 mg tablet Take 5 mg by mouth 2 (two) times daily     levothyroxine  (SYNTHROID ) 50 MCG tablet Take 50 mcg by mouth once daily     omeprazole  (PRILOSEC) 20 MG DR capsule Take 20 mg by mouth 2 (two) times daily     simvastatin  (ZOCOR ) 40 MG tablet Take 40 mg by mouth at bedtime     No current facility-administered medications on file prior to visit.    Family History  Problem Relation Age of Onset   Stroke Mother    Hyperlipidemia (Elevated cholesterol) Mother    High blood pressure (Hypertension) Mother    Diabetes Mother    Stroke Father    Skin cancer Father    Coronary Artery Disease (Blocked arteries around heart) Father    High blood pressure (Hypertension) Brother  Hyperlipidemia (Elevated cholesterol) Brother    Diabetes Brother    Deep vein thrombosis (DVT or abnormal blood clot formation) Daughter      Social History   Tobacco Use  Smoking Status Never  Smokeless Tobacco Never     Social History   Socioeconomic History   Marital status: Divorced  Tobacco Use   Smoking status: Never   Smokeless tobacco: Never  Vaping Use   Vaping status: Never Used  Substance and Sexual Activity   Alcohol use: Yes    Alcohol/week: 2.0 - 6.0 standard drinks of alcohol    Types: 2 - 6 Standard drinks or equivalent per week   Drug use: Never   Social Drivers of Health   Financial Resource Strain: Medium Risk (06/04/2024)   Received from [facility]   Overall Financial Resource Strain  (CARDIA)    How hard is it for you to pay for the very basics like food, housing, medical care, and heating?: Somewhat hard  Food Insecurity: No Food Insecurity (06/04/2024)   Received from [facility]   Hunger Vital Sign    Within the past 12 months, you worried that your food would run out before you got the money to buy more.: Never true    Within the past 12 months, the food you bought just didn't last and you didn't have money to get more.: Never true  Transportation Needs: No Transportation Needs (06/04/2024)   Received from [facility] - Transportation    In the past 12 months, has lack of transportation kept you from medical appointments or from getting medications?: No    In the past 12 months, has lack of transportation kept you from meetings, work, or from getting things needed for daily living?: No  Physical Activity: Sufficiently Active (06/04/2024)   Received from [facility]   Exercise Vital Sign    On average, how many days per week do you engage in moderate to strenuous exercise (like a brisk walk)?: 7 days    On average, how many minutes do you engage in exercise at this level?: 70 min  Stress: No Stress Concern Present (06/04/2024)   Received from [facility] - Occupational Stress Questionnaire    Do you feel stress - tense, restless, nervous, or anxious, or unable to sleep at night because your mind is troubled all the time - these days?: Not at all  Social Connections: Moderately Integrated (06/04/2024)   Received from [facility]   Social Connection and Isolation Panel    In a typical week, how many times do you talk on the phone with family, friends, or neighbors?: More than three times a week    How often do you get together with friends or relatives?: Three times a week    How often do you attend church or religious services?: More than 4 times per year    Do you belong to any clubs or organizations  such as church groups, unions, fraternal or athletic groups, or school groups?: Yes    How often do you attend meetings of the clubs or organizations you belong to?: More than 4 times per year    Are you married, widowed, divorced, separated, never married, or living with a partner?: Divorced    Objective:   Vitals:   08/15/24 0940  BP: 131/73  Pulse: 69  Temp: 36.8 C (98.2 F)  SpO2: 97%  Weight: 69.4 kg (153 lb)  Height:  161.3 cm (5' 3.5)  PainSc: 0-No pain    Body mass index is 26.68 kg/m.  Physical Exam   She appears well on exam  A chaperone was present for the exam  There are no palpable breast masses on either side.  The nipple areolar complexes are normal. There is no axillary adenopathy on either side.  Abdomen is soft and nontender  Labs, Imaging and Diagnostic Testing: I have reviewed her notes in the electronic medical records.  I have reviewed her ultrasound of the abdomen.  I have also reviewed her mammograms, ultrasound, and pathology results  Assessment and Plan:     Diagnoses and all orders for this visit:  Invasive ductal carcinoma of breast, left (CMS/HHS-HCC) -     Ambulatory Referral to Radiation Oncology    Cholelithiasis   At this point we will address her breast cancer first.  I gave her a copy of the pathology results and we discussed breast cancer in detail.  We discussed the multidisciplinary approach to treating breast cancer including medical and radiation oncology.  From a surgical standpoint we discussed breast conservation with lumpectomy versus mastectomy and the long-term results of each.  She is interested in breast conservation.  I next discussed proceeding with a radioactive seed guided left breast lumpectomy.  I explained the surgical procedure in detail.  We discussed the risks which includes but is not limited to bleeding, infection, injury to surrounding structures, the need for further surgery if margins are positive,  cardiopulmonary issues with anesthesia, blood clots, postoperative recovery, excetra.  She will need to stop her Eliquis  2 days preoperatively.  We discussed this as well.  She again already has an appointment with medical oncology on Monday and we will get her referred to radiation oncology as well.  When she has recovered from her breast surgery we can address whether or not she needs a cholecystectomy.  She understands and agrees with the plans  Complex medical decision making VICENTA DASIE POLI, MD    I spent a total of 55 minutes in both face-to-face and non-face-to-face activities, excluding procedures performed, for this visit on the date of this encounter.     "

## 2024-08-15 NOTE — Progress Notes (Signed)
 IDickey Friedenbach, CMA, was present as chaperone for the sensitive portion of the exam.

## 2024-08-18 ENCOUNTER — Encounter: Payer: Self-pay | Admitting: Hematology & Oncology

## 2024-08-18 ENCOUNTER — Inpatient Hospital Stay: Attending: Hematology & Oncology

## 2024-08-18 ENCOUNTER — Inpatient Hospital Stay: Admitting: Hematology & Oncology

## 2024-08-18 ENCOUNTER — Encounter: Payer: Self-pay | Admitting: *Deleted

## 2024-08-18 VITALS — BP 117/57 | HR 59 | Temp 98.0°F | Resp 20 | Ht 63.5 in | Wt 151.8 lb

## 2024-08-18 DIAGNOSIS — Z820 Family history of epilepsy and other diseases of the nervous system: Secondary | ICD-10-CM | POA: Insufficient documentation

## 2024-08-18 DIAGNOSIS — Z8249 Family history of ischemic heart disease and other diseases of the circulatory system: Secondary | ICD-10-CM | POA: Insufficient documentation

## 2024-08-18 DIAGNOSIS — Z79899 Other long term (current) drug therapy: Secondary | ICD-10-CM | POA: Diagnosis not present

## 2024-08-18 DIAGNOSIS — C50412 Malignant neoplasm of upper-outer quadrant of left female breast: Secondary | ICD-10-CM | POA: Insufficient documentation

## 2024-08-18 DIAGNOSIS — C50411 Malignant neoplasm of upper-outer quadrant of right female breast: Secondary | ICD-10-CM

## 2024-08-18 DIAGNOSIS — Z7901 Long term (current) use of anticoagulants: Secondary | ICD-10-CM | POA: Diagnosis not present

## 2024-08-18 DIAGNOSIS — E785 Hyperlipidemia, unspecified: Secondary | ICD-10-CM | POA: Insufficient documentation

## 2024-08-18 DIAGNOSIS — Z8616 Personal history of COVID-19: Secondary | ICD-10-CM | POA: Insufficient documentation

## 2024-08-18 DIAGNOSIS — Z170 Estrogen receptor positive status [ER+]: Secondary | ICD-10-CM | POA: Diagnosis not present

## 2024-08-18 DIAGNOSIS — Z86718 Personal history of other venous thrombosis and embolism: Secondary | ICD-10-CM | POA: Insufficient documentation

## 2024-08-18 DIAGNOSIS — D6851 Activated protein C resistance: Secondary | ICD-10-CM | POA: Insufficient documentation

## 2024-08-18 DIAGNOSIS — Z823 Family history of stroke: Secondary | ICD-10-CM | POA: Diagnosis not present

## 2024-08-18 DIAGNOSIS — Z9089 Acquired absence of other organs: Secondary | ICD-10-CM | POA: Diagnosis not present

## 2024-08-18 DIAGNOSIS — Z832 Family history of diseases of the blood and blood-forming organs and certain disorders involving the immune mechanism: Secondary | ICD-10-CM | POA: Diagnosis not present

## 2024-08-18 DIAGNOSIS — Z1732 Human epidermal growth factor receptor 2 negative status: Secondary | ICD-10-CM | POA: Diagnosis not present

## 2024-08-18 DIAGNOSIS — Z833 Family history of diabetes mellitus: Secondary | ICD-10-CM | POA: Insufficient documentation

## 2024-08-18 LAB — CBC WITH DIFFERENTIAL (CANCER CENTER ONLY)
Abs Immature Granulocytes: 0.02 K/uL (ref 0.00–0.07)
Basophils Absolute: 0 K/uL (ref 0.0–0.1)
Basophils Relative: 1 %
Eosinophils Absolute: 0.1 K/uL (ref 0.0–0.5)
Eosinophils Relative: 2 %
HCT: 41.7 % (ref 36.0–46.0)
Hemoglobin: 14.1 g/dL (ref 12.0–15.0)
Immature Granulocytes: 0 %
Lymphocytes Relative: 28 %
Lymphs Abs: 1.8 K/uL (ref 0.7–4.0)
MCH: 30.7 pg (ref 26.0–34.0)
MCHC: 33.8 g/dL (ref 30.0–36.0)
MCV: 90.7 fL (ref 80.0–100.0)
Monocytes Absolute: 0.4 K/uL (ref 0.1–1.0)
Monocytes Relative: 6 %
Neutro Abs: 4.2 K/uL (ref 1.7–7.7)
Neutrophils Relative %: 63 %
Platelet Count: 172 K/uL (ref 150–400)
RBC: 4.6 MIL/uL (ref 3.87–5.11)
RDW: 12.2 % (ref 11.5–15.5)
WBC Count: 6.6 K/uL (ref 4.0–10.5)
nRBC: 0 % (ref 0.0–0.2)

## 2024-08-18 LAB — CMP (CANCER CENTER ONLY)
ALT: 62 U/L — ABNORMAL HIGH (ref 0–44)
AST: 33 U/L (ref 15–41)
Albumin: 4.1 g/dL (ref 3.5–5.0)
Alkaline Phosphatase: 62 U/L (ref 38–126)
Anion gap: 8 (ref 5–15)
BUN: 16 mg/dL (ref 8–23)
CO2: 28 mmol/L (ref 22–32)
Calcium: 9.5 mg/dL (ref 8.9–10.3)
Chloride: 106 mmol/L (ref 98–111)
Creatinine: 0.66 mg/dL (ref 0.44–1.00)
GFR, Estimated: >60 mL/min
Glucose, Bld: 133 mg/dL — ABNORMAL HIGH (ref 70–99)
Potassium: 4.8 mmol/L (ref 3.5–5.1)
Sodium: 141 mmol/L (ref 135–145)
Total Bilirubin: 0.3 mg/dL (ref 0.0–1.2)
Total Protein: 5.9 g/dL — ABNORMAL LOW (ref 6.5–8.1)

## 2024-08-18 LAB — LACTATE DEHYDROGENASE: LDH: 191 U/L (ref 105–235)

## 2024-08-18 NOTE — Progress Notes (Signed)
 Initial RN Navigator Patient Visit  Name: Amy Perkins Date of Referral : 08/08/2024 Diagnosis: IDC L breast   Met with patient prior to their visit with MD. [name] patient Your Patient Navigator handout which explains my role, areas in which I am able to help, and all the contact information for myself and the office. Also gave patient MD and Navigator business card. Reviewed with patient the general overview of expected course after initial diagnosis and time frame for all steps to be completed.  New patient packet given to patient which includes: orientation to office and staff; campus directory; education on My Chart and Advance Directives; and patient centered education on breast cancer.  Patient comes in on her own. She lives with her adult son who has high functioning autism. She reports he is self sufficient and can be helpful in the home. She has another child near by and other people who she can rely on. She is a retired runner, broadcasting/film/video.   Patient saw Dr Vernetta on Friday and plan for lumpectomy made. She needs seed placement first.   Referrals to nutrition and social work placed per protocol.   Patient completed visit with Dr. Timmy. Once office note dictated and orders placed will work on scheduling.   Patient understands all follow up procedures and expectations. They have my number to reach out for any further clarification or additional needs.   Oncology Nurse Navigator Documentation     08/18/2024   11:00 AM  Oncology Nurse Navigator Flowsheets  Navigator Follow Up Date: 08/19/2024  Navigator Follow Up Reason: Appointment Review  Navigator Location [facility]  Navigator Encounter Type Initial MedOnc  Patient Visit Type MedOnc  Treatment Phase Pre-Tx/Tx Discussion  Barriers/Navigation Needs Coordination of Care;Education  Education Newly Diagnosed Cancer Education;Preparing for Upcoming Surgery/ Treatment  Interventions Education;Referrals  Acuity Level 2-Minimal  Needs (1-2 Barriers Identified)  Referrals Nutrition/dietician;Social Work  Nature Conservation Officer Groups/Services Friends and Family  Time Spent with Patient 30

## 2024-08-18 NOTE — Progress Notes (Signed)
 Referral MD  Reason for Referral: Likely Stage I ductal carcinoma of the left breast - ER+/PR+/HER2-  Chief Complaint  Patient presents with   Follow-up  : I now have breast cancer.  HPI: Amy Perkins is well-known to me.  She is a very charming 73 year old white female.  We actually have been seeing her because of a history of thromboembolic disease.  She has factor V Leiden mutation.  She is heterozygous for this.  She is on Eliquis .  We last saw her back in February 2025.  She gets yearly mammograms.  She had 1 done on 07/22/2024.  This showed a highly suspicious lesion that was 9 x 4 x 7 mm at the 2 o'clock position.  This was 3 cm from the nipple.  She then had a ultrasound biopsy done on 08/01/2024.  This was done successfully.  The pathology report [id number]) showed an invasive ductal carcinoma.  It was relatively low-grade.  There is no lymphovascular space invasion.  The cancer measured 4.5 mm.  Prognostic markers were done.  It was strongly ER positive/PR positive/HER2 negative.  The proliferation marker - Ki67 -was 10%.  She has seen Dr. Vernetta.  He we will plan on a lumpectomy.  She does not know the date yet..  She is kindly referred to the [facility] for further recommendations.  There is no history of breast cancer in the family.  There is a history of prostate cancer and a grandfather and a brother.  She had COVID several years ago.  She does not smoke.  She does not really drink alcohol.  She may have wine on occasion.  She has not been on any antiestrogens in the postmenopausal state.  She has done well with the Eliquis .  She has had no problems with bleeding.  There is been no problems with thromboembolism.  She is a retired runner, broadcasting/film/video.  She is originally from [place].  She has been down here for many years.  Overall, I will have to say that her performance status is probably ECOG 1.    Past Medical History:  Diagnosis Date    Depression    Hyperlipidemia    Migraines    Rectal bleeding 05/26/2020   Squamous cell carcinoma 09/20/2017   chest  :   Past Surgical History:  Procedure Laterality Date   BREAST BIOPSY Left 08/01/2024   US  LT BREAST BX W LOC DEV 1ST LESION IMG BX SPEC US  GUIDE 08/01/2024 [facility] MAMMOGRAPHY   DILATION AND CURETTAGE OF UTERUS     x2   KNEE ARTHROSCOPY Left    laser vein surgery     Left hand surgery Left    Thumb   TONSILLECTOMY    :  Current Medications[1]:     Allergies[2]: NKDA   Family History  Problem Relation Age of Onset   Coronary artery disease Father    Stroke Father    Parkinson's disease Father    Hypertension Mother    Diabetes Mother    Stroke Mother    Coronary artery disease Brother    Factor V Leiden deficiency Daughter    Heart disease Paternal Grandfather    Colon cancer Neg Hx    Esophageal cancer Neg Hx    Pancreatic cancer Neg Hx    Stomach cancer Neg Hx    Rectal cancer Neg Hx   :   Social History   Socioeconomic History   Marital status: Divorced    Spouse name: Not  on file   Number of children: 2   Years of education: college   Highest education level: Master's degree (e.g., MA, MS, MEng, MEd, MSW, MBA)  Occupational History   Occupation: Magazine Features Editor: [employer]    Comment: [employer]  Tobacco Use   Smoking status: Never   Smokeless tobacco: Never  Vaping Use   Vaping status: Never Used  Substance and Sexual Activity   Alcohol use: Yes    Alcohol/week: 4.0 standard drinks of alcohol    Types: 4 Glasses of wine per week    Comment: 4 glass week   Drug use: No   Sexual activity: Not Currently  Other Topics Concern   Not on file  Social History Narrative   Divorced, both children live locally.   Patient works part time at [employer]. College education.   Left handed.   Caffeine- two cups coffee daily.   Social Drivers of Health   Tobacco Use: Low Risk (08/18/2024)   Patient History    Smoking Tobacco  Use: Never    Smokeless Tobacco Use: Never    Passive Exposure: Not on file  Financial Resource Strain: Medium Risk (06/04/2024)   Overall Financial Resource Strain (CARDIA)    Difficulty of Paying Living Expenses: Somewhat hard  Food Insecurity: No Food Insecurity (08/16/2024)   Epic    Worried About Radiation Protection Practitioner of Food in the Last Year: Never true    Ran Out of Food in the Last Year: Never true  Transportation Needs: No Transportation Needs (08/16/2024)   Epic    Lack of Transportation (Medical): No    Lack of Transportation (Non-Medical): No  Physical Activity: Sufficiently Active (06/04/2024)   Exercise Vital Sign    Days of Exercise per Week: 7 days    Minutes of Exercise per Session: 70 min  Stress: No Stress Concern Present (06/04/2024)   [employer] of Occupational Health - Occupational Stress Questionnaire    Feeling of Stress: Not at all  Social Connections: Moderately Integrated (06/04/2024)   Social Connection and Isolation Panel    Frequency of Communication with Friends and Family: More than three times a week    Frequency of Social Gatherings with Friends and Family: Three times a week    Attends Religious Services: More than 4 times per year    Active Member of Clubs or Organizations: Yes    Attends Banker Meetings: More than 4 times per year    Marital Status: Divorced  Intimate Partner Violence: Not At Risk (04/23/2024)   Epic    Fear of Current or Ex-Partner: No    Emotionally Abused: No    Physically Abused: No    Sexually Abused: No  Depression (PHQ2-9): Low Risk (08/18/2024)   Depression (PHQ2-9)    PHQ-2 Score: 0  Alcohol Screen: Low Risk (06/04/2024)   Alcohol Screen    Last Alcohol Screening Score (AUDIT): 3  Housing: Low Risk (08/16/2024)   Epic    Unable to Pay for Housing in the Last Year: No    Number of Times Moved in the Last Year: 0    Homeless in the Last Year: No  Utilities: Not At Risk (08/16/2024)   Epic     Threatened with loss of utilities: No  Health Literacy: Adequate Health Literacy (04/23/2024)   B1300 Health Literacy    Frequency of need for help with medical instructions: Never  :  Review of Systems  Constitutional: Negative.   HENT: Negative.  Eyes: Negative.   Respiratory: Negative.    Cardiovascular: Negative.   Gastrointestinal: Negative.   Genitourinary: Negative.   Musculoskeletal: Negative.   Skin: Negative.   Neurological: Negative.   Endo/Heme/Allergies: Negative.   Psychiatric/Behavioral: Negative.       Exam:  Vital signs show temperature of 98.  Pulse 59.  Blood pressure 117/57.  Weight is 151 pounds. @IPVITALS @   Physical Exam Vitals reviewed.  Constitutional:      Comments: This is a well-developed well-nourished white female in no obvious distress.  Her breast exam shows left breast with a healed lumpectomy scar.  She has little bit of a keloid.  This is about 11 o'clock position.  There is no distinct mass.  There is no erythema.  There is no nipple discharge.  There is no left axillary adenopathy.  Right breast shows no masses, edema or erythema.  There is no right axillary adenopathy.  HENT:     Head: Normocephalic and atraumatic.  Eyes:     Pupils: Pupils are equal, round, and reactive to light.  Cardiovascular:     Rate and Rhythm: Normal rate and regular rhythm.     Heart sounds: Normal heart sounds.  Pulmonary:     Effort: Pulmonary effort is normal.     Breath sounds: Normal breath sounds.  Abdominal:     General: Bowel sounds are normal.     Palpations: Abdomen is soft.  Musculoskeletal:        General: No tenderness or deformity. Normal range of motion.     Cervical back: Normal range of motion.  Lymphadenopathy:     Cervical: No cervical adenopathy.  Skin:    General: Skin is warm and dry.     Findings: No erythema or rash.  Neurological:     Mental Status: She is alert and oriented to person, place, and time.  Psychiatric:         Behavior: Behavior normal.        Thought Content: Thought content normal.        Judgment: Judgment normal.     Recent Labs    08/18/24 1101  WBC 6.6  HGB 14.1  HCT 41.7  PLT 172    Recent Labs    08/18/24 1101  NA 141  K 4.8  CL 106  CO2 28  GLUCOSE 133*  BUN 16  CREATININE 0.66  CALCIUM 9.5    Blood smear review: None  Pathology: See above    Assessment and Plan: Amy Perkins is a very charming 73 year old postmenopausal white female.  She does have heterozygous factor V Leiden.  She is on Eliquis ..  She now has an early-stage breast cancer.  I have to believe this to go to be a stage I breast cancer by the results I have seen so far.  I will not sure when she is going to have surgery.  She will have a lumpectomy.  I would think that given the small size of this tumor, that she probably will need short course of radiotherapy.  From my point of view, I really do not think that she needs have an MRI.  I just do not think this would add anything to what she has had done already.  Again, she does not have a lumpectomy.  It we will see what the final stage is.  However, I have to believe this is good to be a stage I breast cancer.  As far as systemic therapy, I suspect she  probably will need an aromatase inhibitor.  We will see about get her started on 1 after she has radiotherapy.  I would think that she would not need to have any type of staging studies.  Forgot to mention that she is going to have her gallbladder out from what she tells me.  Dr. Vernetta would be the doctor to do this also.  He wants to take care of the breast cancer before he does anything with the gallbladder.  For right now, I will plan to get her back to see me probably about a month.  At that point, she should have had her lumpectomy.  I answered all of her questions.  She is incredibly delightful to talk to.  I am sure that she will do quite well.     [1]  Current Outpatient Medications:     ALPRAZolam  (XANAX ) 0.5 MG tablet, Take 0.5 mg by mouth See admin instructions. Take 1/2 tablet by mouth 2 times daily (morning and 3PM) , and 1 tablet at bedtime., Disp: , Rfl:    apixaban  (ELIQUIS ) 5 MG TABS tablet, Take 1 tablet (5 mg total) by mouth 2 (two) times daily., Disp: 180 tablet, Rfl: 3   citalopram  (CELEXA ) 40 MG tablet, Take 1 tablet (40 mg total) by mouth daily., Disp: 30 tablet, Rfl: 5   levothyroxine  (SYNTHROID ) 50 MCG tablet, TAKE 1 TABLET BY MOUTH EVERY DAY, Disp: 90 tablet, Rfl: 1   omeprazole  (PRILOSEC) 20 MG capsule, TAKE 1 CAPSULE BY MOUTH TWICE A DAY, Disp: 180 capsule, Rfl: 1   simvastatin  (ZOCOR ) 40 MG tablet, TAKE 1 TABLET BY MOUTH EVERYDAY AT BEDTIME, Disp: 90 tablet, Rfl: 1   SYMBICORT  160-4.5 MCG/ACT inhaler, INHALE 2 PUFFS INTO THE LUNGS TWICE A DAY, Disp: 10.2 each, Rfl: 12   Multiple Vitamin (MULTIVITAMIN ADULT PO), daily., Disp: , Rfl:  [2] No Known Allergies

## 2024-08-19 ENCOUNTER — Encounter: Payer: Self-pay | Admitting: *Deleted

## 2024-08-19 ENCOUNTER — Ambulatory Visit: Payer: Self-pay | Admitting: Hematology & Oncology

## 2024-08-19 DIAGNOSIS — C50411 Malignant neoplasm of upper-outer quadrant of right female breast: Secondary | ICD-10-CM

## 2024-08-19 LAB — CANCER ANTIGEN 27.29: CA 27.29: 21.9 U/mL (ref 0.0–38.6)

## 2024-08-19 NOTE — Progress Notes (Unsigned)
 Please call and let her know that the tumor marker that we used for breast cancer is normal. This is great news. Amy Perkins   Patient is informed of this result. Referral for Radiation Oncology placed per protocol. Patient knows that we will follow up with her post surgery, but if any questions or concerns come up before that time she is encouraged to call the office.   Oncology Nurse Navigator Documentation     08/19/2024    1:00 PM  Oncology Nurse Navigator Flowsheets  Navigator Follow Up Date: 09/01/2024  Navigator Follow Up Reason: Surgery  Navigator Location [facility]  Navigator Encounter Type Telephone  Patient Visit Type MedOnc  Treatment Phase Pre-Tx/Tx Discussion  Barriers/Navigation Needs Coordination of Care;Education  Education Other  Interventions Education;Referrals  Acuity Level 2-Minimal Needs (1-2 Barriers Identified)  Referrals Radiation Oncology  Education Method Verbal  Support Groups/Services Friends and Family  Time Spent with Patient 30

## 2024-08-20 ENCOUNTER — Other Ambulatory Visit: Payer: Self-pay | Admitting: Surgery

## 2024-08-20 DIAGNOSIS — Z853 Personal history of malignant neoplasm of breast: Secondary | ICD-10-CM

## 2024-08-22 ENCOUNTER — Inpatient Hospital Stay: Attending: Hematology & Oncology

## 2024-08-22 NOTE — Progress Notes (Signed)
 [facility] Clinical Social Work  Initial Assessment   Amy Perkins is a 74 y.o. year old female contacted by phone. Clinical Social Work was referred by nurse navigator for assessment of psychosocial needs.   SDOH (Social Determinants of Health) assessments performed: Yes SDOH Interventions    Flowsheet Row Clinical Support from 04/23/2024 in [facility] Clinical Support from 02/08/2023 in [facility] Clinical Support from 02/02/2022 in [facility] Visit from 04/15/2019 in [facility] Clinical Support from 10/16/2018 in [facility] [employer] Visit from 10/17/2017 in [facility] [employer]  SDOH Interventions        Food Insecurity Interventions Intervention Not Indicated Intervention Not Indicated Intervention Not Indicated -- -- --  Housing Interventions Intervention Not Indicated Intervention Not Indicated Intervention Not Indicated -- -- --  Transportation Interventions Intervention Not Indicated Intervention Not Indicated Intervention Not Indicated -- -- --  Utilities Interventions Intervention Not Indicated Intervention Not Indicated -- -- -- --  Alcohol Usage Interventions Intervention Not Indicated (Score <7) -- -- -- -- --  Depression Interventions/Treatment  -- -- -- Medication, Currently on Treatment Currently on Treatment, PHQ2-9 Score <4 Follow-up Not Indicated Currently on Treatment  Financial Strain Interventions Intervention Not Indicated Intervention Not Indicated Intervention Not Indicated -- -- --  Physical Activity Interventions Intervention Not Indicated Intervention Not Indicated Intervention Not Indicated -- -- --  Stress Interventions Intervention Not Indicated Intervention Not Indicated Intervention Not Indicated -- -- --  Social Connections  Interventions Intervention Not Indicated Intervention Not Indicated Intervention Not Indicated -- -- --  Health Literacy Interventions Intervention Not Indicated -- -- -- -- --    SDOH Screenings   Food Insecurity: No Food Insecurity (08/16/2024)  Housing: Low Risk (08/16/2024)  Transportation Needs: No Transportation Needs (08/16/2024)  Utilities: Not At Risk (08/16/2024)  Alcohol Screen: Low Risk (06/04/2024)  Depression (PHQ2-9): Low Risk (08/18/2024)  Financial Resource Strain: Medium Risk (06/04/2024)  Physical Activity: Sufficiently Active (06/04/2024)  Social Connections: Moderately Integrated (06/04/2024)  Stress: No Stress Concern Present (06/04/2024)  Tobacco Use: Low Risk (08/18/2024)  Health Literacy: Adequate Health Literacy (04/23/2024)    PHQ 2/9:    08/18/2024   11:22 AM 04/23/2024    9:36 AM 06/12/2023    9:28 AM  Depression screen PHQ 2/9  Decreased Interest 0 0 0  Down, Depressed, Hopeless 0 0 0  PHQ - 2 Score 0 0 0  Altered sleeping  0 0  Tired, decreased energy  0 0  Change in appetite  0 0  Feeling bad or failure about yourself   0 0  Trouble concentrating  0 0  Moving slowly or fidgety/restless  0 0  Suicidal thoughts   0  PHQ-9 Score  0  0   Difficult doing work/chores  Not difficult at all Not difficult at all     Data saved with a previous flowsheet row definition     Distress Screen completed: No     No data to display            Family/Social Information:  Housing Arrangement: patient lives with her son. Family members/support persons in your life? Family and [employer] concerns: no  Employment: Retired  Income source: Actor concerns: No Type of concern: None Food access concerns: no Religious or spiritual practice: Yes-Patient has a [employer]  family. Advanced directives: No Services Currently in place:  Medicare  Coping/ Adjustment to diagnosis: Patient understands treatment plan  and what happens next? yes Concerns about diagnosis and/or treatment: Afraid of cancer Patient reported stressors: Adjusting to my illness Hopes and/or priorities: Family Patient enjoys time with family/ friends Current coping skills/ strengths: Average or above average intelligence , Capable of independent living , Manufacturing systems engineer , Contractor , General fund of knowledge , Motivation for treatment/growth , Religious Affiliation , and Supportive family/friends     SUMMARY: Current SDOH Barriers:  None  Clinical Social Work Clinical Goal(s):  No clinical social work goals at this time  Interventions: Discussed common feeling and emotions when being diagnosed with cancer, and the importance of support during treatment Informed patient of the support team roles and support services at [facility] Provided CSW contact information and encouraged patient to call with any questions or concerns Provided patient with information about treating the mind and body and she agreed.  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    Follow Up Plan: Patient will contact CSW with any support or resource needs Patient verbalizes understanding of plan: Yes    Macario CHRISTELLA Au, LCSW Clinical Social Worker [facility]

## 2024-08-25 ENCOUNTER — Other Ambulatory Visit: Payer: Self-pay

## 2024-08-25 ENCOUNTER — Encounter (HOSPITAL_BASED_OUTPATIENT_CLINIC_OR_DEPARTMENT_OTHER): Payer: Self-pay | Admitting: Surgery

## 2024-08-25 DIAGNOSIS — Z170 Estrogen receptor positive status [ER+]: Secondary | ICD-10-CM | POA: Insufficient documentation

## 2024-08-25 NOTE — Progress Notes (Signed)
 " Radiation Oncology         [phone] ________________________________  Name: Amy Perkins        MRN: [id number]  Date of Service: 08/26/2024 DOB: Feb 07, 1951  RR:Ujanmp, Comer FORBES, MD  Timmy Maude SAUNDERS, MD     REFERRING PHYSICIAN: Timmy Maude SAUNDERS, MD   DIAGNOSIS: The encounter diagnosis was Malignant neoplasm of upper-outer quadrant of left breast in female, estrogen receptor positive (HCC).   HISTORY OF PRESENT ILLNESS: Amy Perkins is a 74 y.o. female seen at the request of Dr. Timmy for a diagnosis of breast cancer.  The patient had a screening detected architectural distortion in her left breast and returned for diagnostic workup on 07/22/24 showed persistent architectural distortion on the spot compression views in the upper outer quadrant were noted.  By ultrasound in the 2 o'clock position was a 9 mm mass with posterior acoustic shadowing, and the left axilla was normal in appearance.  She underwent a biopsy on 08/01/2024 which showed grade 2 invasive ductal carcinoma that was ER/PR positive, HER2 negative with a Ki-67 of 10%.  She is planning to undergo a left lumpectomy with Dr. Vernetta on 09/01/2024.  She is also known to Dr. Timmy given a history of factor V Leiden and remains on Eliquis .  She is seen today to discuss adjuvant role of radiation.    PREVIOUS RADIATION THERAPY: No   PAST MEDICAL HISTORY:  Past Medical History:  Diagnosis Date   Depression    Hyperlipidemia    Migraines    Rectal bleeding 05/26/2020   Squamous cell carcinoma 09/20/2017   chest       PAST SURGICAL HISTORY: Past Surgical History:  Procedure Laterality Date   BREAST BIOPSY Left 08/01/2024   US  LT BREAST BX W LOC DEV 1ST LESION IMG BX SPEC US  GUIDE 08/01/2024 [facility] MAMMOGRAPHY   DILATION AND CURETTAGE OF UTERUS     x2   KNEE ARTHROSCOPY Left    laser vein surgery     Left hand surgery Left    Thumb   TONSILLECTOMY       FAMILY HISTORY:  Family History  Problem  Relation Age of Onset   Coronary artery disease Father    Stroke Father    Parkinson's disease Father    Hypertension Mother    Diabetes Mother    Stroke Mother    Coronary artery disease Brother    Factor V Leiden deficiency Daughter    Heart disease Paternal Grandfather    Colon cancer Neg Hx    Esophageal cancer Neg Hx    Pancreatic cancer Neg Hx    Stomach cancer Neg Hx    Rectal cancer Neg Hx      SOCIAL HISTORY:  reports that she has never smoked. She has never used smokeless tobacco. She reports current alcohol use of about 4.0 standard drinks of alcohol per week. She reports that she does not use drugs.  The patient is divorced and lives in [place]. She is originally from [place] , and is a retired materials engineer. She attends [employer] where she is very active.   ALLERGIES: Patient has no known allergies.   MEDICATIONS:  Current Outpatient Medications  Medication Sig Dispense Refill   ALPRAZolam  (XANAX ) 0.5 MG tablet Take 0.5 mg by mouth See admin instructions. Take 1/2 tablet by mouth 2 times daily (morning and 3PM) , and 1 tablet at bedtime.     apixaban  (ELIQUIS ) 5 MG TABS  tablet Take 1 tablet (5 mg total) by mouth 2 (two) times daily. 180 tablet 3   citalopram  (CELEXA ) 40 MG tablet Take 1 tablet (40 mg total) by mouth daily. 30 tablet 5   levothyroxine  (SYNTHROID ) 50 MCG tablet TAKE 1 TABLET BY MOUTH EVERY DAY 90 tablet 1   Multiple Vitamin (MULTIVITAMIN ADULT PO) daily.     omeprazole  (PRILOSEC) 20 MG capsule TAKE 1 CAPSULE BY MOUTH TWICE A DAY 180 capsule 1   simvastatin  (ZOCOR ) 40 MG tablet TAKE 1 TABLET BY MOUTH EVERYDAY AT BEDTIME 90 tablet 1   SYMBICORT  160-4.5 MCG/ACT inhaler INHALE 2 PUFFS INTO THE LUNGS TWICE A DAY 10.2 each 12   No current facility-administered medications for this visit.     REVIEW OF SYSTEMS: On review of systems, the patient reports that she is doing well since her biopsy. No breast complaints are  verbalized. She notes a history of early melanomatous changes and squamous cell skin cancer, both of which have been previously resected and treated with surgery alone. No other complaints are verbalized.      PHYSICAL EXAM:  Wt Readings from Last 3 Encounters:  08/18/24 151 lb 12.8 oz (68.9 kg)  06/11/24 147 lb 2 oz (66.7 kg)  04/23/24 144 lb (65.3 kg)   Temp Readings from Last 3 Encounters:  08/18/24 98 F (36.7 C) (Oral)  06/11/24 97.9 F (36.6 C)  12/11/23 98.2 F (36.8 C)   BP Readings from Last 3 Encounters:  08/18/24 (!) 117/57  06/11/24 110/64  12/11/23 104/64   Pulse Readings from Last 3 Encounters:  08/18/24 (!) 59  06/11/24 62  12/11/23 61    In general this is a well appearing Caucasian female in no acute distress. She's alert and oriented x4 and appropriate throughout the examination. Cardiopulmonary assessment is negative for acute distress and she exhibits normal effort. Bilateral breast exam is deferred.    ECOG = 1  0 - Asymptomatic (Fully active, able to carry on all predisease activities without restriction)  1 - Symptomatic but completely ambulatory (Restricted in physically strenuous activity but ambulatory and able to carry out work of a light or sedentary nature. For example, light housework, office work)  2 - Symptomatic, <50% in bed during the day (Ambulatory and capable of all self care but unable to carry out any work activities. Up and about more than 50% of waking hours)  3 - Symptomatic, >50% in bed, but not bedbound (Capable of only limited self-care, confined to bed or chair 50% or more of waking hours)  4 - Bedbound (Completely disabled. Cannot carry on any self-care. Totally confined to bed or chair)  5 - Death   Raylene MM, Creech RH, Tormey DC, et al. [phone]). Toxicity and response criteria of the [facility]. Am. DOROTHA Bridges. Oncol. 5 (6): 649-55    LABORATORY DATA:  Lab Results  Component Value Date   WBC 6.6  08/18/2024   HGB 14.1 08/18/2024   HCT 41.7 08/18/2024   MCV 90.7 08/18/2024   PLT 172 08/18/2024   Lab Results  Component Value Date   NA 141 08/18/2024   K 4.8 08/18/2024   CL 106 08/18/2024   CO2 28 08/18/2024   Lab Results  Component Value Date   ALT 62 (H) 08/18/2024   AST 33 08/18/2024   ALKPHOS 62 08/18/2024   BILITOT 0.3 08/18/2024      RADIOGRAPHY: US  LT BREAST BX W LOC DEV 1ST LESION IMG BX SPEC  US  GUIDE Addendum Date: 08/06/2024 ADDENDUM REPORT: 08/06/2024 09:33 ADDENDUM: PATHOLOGY revealed: Breast, left, needle core biopsy, 2 o'clock, 3 cmfn, upper outer middle depth- INVASIVE DUCTAL CARCINOMA- OVERALL GRADE: 2- LYMPHOVASCULAR INVASION: NOT IDENTIFIED- CANCER LENGTH: 4.5 MM / 0.45 CM- CALCIFICATIONS: NOT IDENTIFIED Pathology results are CONCORDANT with imaging findings, per Dr. Rosina Gelineau. Pathology results and recommendations below were discussed with patient by telephone on 08/04/2024. Patient reported biopsy site doing well with no adverse symptoms, and only slight tenderness at the site. Post biopsy care instructions were reviewed, questions were answered and my direct phone number was provided. Patient was instructed to call [facility] for any additional questions or concerns related to biopsy site. RECOMMENDATION: Surgical and oncological consultation. Request for surgical consultation relayed to Olam Bunnell and Landry Finger at [facility]. Pathology results reported by Mliss CHARM Molt RN 08/04/2024. Electronically Signed   By: Rosina Gelineau M.D.   On: 08/06/2024 09:33   Result Date: 08/06/2024 CLINICAL DATA:  74 year old female with suspicious 9 mm mass in the left breast 2 o'clock middle depth EXAM: ULTRASOUND GUIDED LEFT BREAST CORE NEEDLE BIOPSY COMPARISON:  Previous exam(s). PROCEDURE: I met with the patient and we discussed the procedure of ultrasound-guided biopsy, including benefits and alternatives. We discussed the high  likelihood of a successful procedure. We discussed the risks of the procedure, including infection, bleeding, tissue injury, clip migration, and inadequate sampling. Informed written consent was given. The usual time-out protocol was performed immediately prior to the procedure. Lesion quadrant: Upper outer quadrant Using sterile technique and 1% Lidocaine as local anesthetic, under direct ultrasound visualization, a 14 gauge spring-loaded device was used to perform biopsy of a 9 mm taller than wide irregular hypoechoic mass at 2 o'clock 3 cm from the nipple using a inferolateral approach. At the conclusion of the procedure ribbon shaped tissue marker clip was deployed into the biopsy cavity. Follow up 2 view mammogram was performed and dictated separately. IMPRESSION: Ultrasound guided biopsy of 9 mm irregular mass in the left breast 2 o'clock 3 cm from the nipple, middle depth with placement of a ribbon clip. No apparent complications. Electronically Signed: By: Rosina Gelineau M.D. On: 08/01/2024 08:46   US  Abdomen Limited RUQ (LIVER/GB) Result Date: 08/03/2024 CLINICAL DATA:  elevated LFTs EXAM: ULTRASOUND ABDOMEN LIMITED RIGHT UPPER QUADRANT COMPARISON:  None Available. FINDINGS: Gallbladder: Multiple cholelithiasis. Dominant cholelithiasis measures 2.2 cm. No wall thickening visualized. No sonographic Murphy sign noted by sonographer. Common bile duct: Diameter: Visualized portion measures 5 mm, within normal limits. Liver: No focal hepatic lesion identified. Hepatic echogenicity is mildly heterogeneous. Portal vein is patent on color Doppler imaging with normal direction of blood flow towards the liver. Other: Incidental benign RIGHT renal cyst measuring 1.5 cm (for which no dedicated imaging follow-up is recommended) . IMPRESSION: Cholelithiasis without sonographic evidence of acute cholecystitis. Electronically Signed   By: Corean Salter M.D.   On: 08/03/2024 19:13   MM CLIP PLACEMENT  LEFT Result Date: 08/01/2024 CLINICAL DATA:  74 year old female status post ultrasound-guided biopsy of a left upper outer quadrant breast mass placement of a ribbon clip EXAM: 3D DIAGNOSTIC LEFT MAMMOGRAM POST ULTRASOUND BIOPSY COMPARISON:  Previous exam(s). ACR Breast Density Category b: There are scattered areas of fibroglandular density. FINDINGS: 3D Mammographic images were obtained following ultrasound guided biopsy of a 0.9 cm suspicious mass in the left breast at 2 o'clock 3 cm from the nipple with placement of a ribbon clip. The biopsy marking clip is  in expected position at the site of biopsy. IMPRESSION: Appropriate positioning of the ribbon shaped biopsy marking clip at the site of biopsy in the left breast 2 o'clock 3 cm from the nipple middle depth. Final Assessment: Post Procedure Mammograms for Marker Placement Electronically Signed   By: Rosina Gelineau M.D.   On: 08/01/2024 08:54       IMPRESSION/PLAN: 1. Stage IA, [id number], grade 2 ER/PR positive invasive ductal carcinoma of the left breast. Dr. Dewey discusses the pathology findings and reviews the nature of early-stage breast disease.  The patient will be proceeding with breast conserving left lumpectomy with Dr. Vernetta.  Dr. Timmy will review her final pathology to determine if additional molecular testing is necessary but anticipates adjuvant antiestrogen therapy to follow.  Dr. Dewey discusses the role of radiotherapy in reducing local risks of recurrence. Dr. Dewey also discusses cases in which radiation may be optional for favorable cases based on final pathology.  We will follow-up with her final results of surgery, but if recommended Dr. Dewey would anticipate a course of up to 4 weeks of radiotherapy to the left breast with deep inspiration breath-hold technique.  We will plan to follow-up with her 3 to 4 weeks postoperatively and discuss her options at that time.     In a visit lasting 60 minutes, greater than 50% of the  time was spent face to face reviewing her case, as well as in preparation of, discussing, and coordinating the patient's care.  The above documentation reflects my direct findings during this shared patient visit. Please see the separate note by Dr. Dewey on this date for the remainder of the patient's plan of care.    Donald KYM Husband, [facility]    **Disclaimer: This note was dictated with voice recognition software. Similar sounding words can inadvertently be transcribed and this note may contain transcription errors which may not have been corrected upon publication of note.** "

## 2024-08-25 NOTE — Progress Notes (Signed)
 New Breast Cancer Diagnosis:Left Breast   Patient presented for screening exam and was noted to have architectural distortion.  Diagnostic workup revealed a 9 mm mass with posterior acoustic shadowing, and the left axilla was normal in appearance.   She underwent a biopsy on 08/01/2024 which showed grade 2 invasive ductal carcinoma that was ER/PR positive, HER2 negative with a Ki-67 of 10%.  She is planning to undergo a left lumpectomy with Dr. Vernetta on 09/01/2024.  She is also known to Dr. Timmy given a history of factor V Leiden and remains on Eliquis .  She is seen today to discuss adjuvant role of radiation.   Histology per Pathology Report: grade 2, Invasive Ductal Carcinoma  Receptor Status: ER(positive), PR (positive), Her2-neu (negative), Ki-(10%)   Surgeon and surgical plan, if any:  Dr. Vernetta -Left breast lumpectomy with radioactive seed localization 09/01/2024   Medical oncologist, treatment if any:   Dr. Timmy 08/18/2024 -Follow-up post radiation to discuss starting aromatase inhibitor.   Family History of Breast/Ovarian/Prostate Cancer: Father and brother had prostate cancer  Lymphedema issues, if any: No     Pain issues, if any: No     SAFETY ISSUES: Prior radiation? No Pacemaker/ICD? No Possible current pregnancy? Postmenopausal Is the patient on methotrexate? No  Current Complaints / other details:

## 2024-08-26 ENCOUNTER — Encounter: Payer: Self-pay | Admitting: Radiation Oncology

## 2024-08-26 ENCOUNTER — Ambulatory Visit
Admission: RE | Admit: 2024-08-26 | Discharge: 2024-08-26 | Disposition: A | Discharge status: Home / Self Care | Source: Ambulatory Visit | Attending: Radiation Oncology | Admitting: Radiation Oncology

## 2024-08-26 VITALS — BP 117/73 | HR 67 | Temp 97.8°F | Resp 20 | Ht 63.5 in | Wt 148.4 lb

## 2024-08-26 DIAGNOSIS — Z7951 Long term (current) use of inhaled steroids: Secondary | ICD-10-CM | POA: Insufficient documentation

## 2024-08-26 DIAGNOSIS — E785 Hyperlipidemia, unspecified: Secondary | ICD-10-CM | POA: Diagnosis not present

## 2024-08-26 DIAGNOSIS — Z170 Estrogen receptor positive status [ER+]: Secondary | ICD-10-CM

## 2024-08-26 DIAGNOSIS — C50412 Malignant neoplasm of upper-outer quadrant of left female breast: Secondary | ICD-10-CM | POA: Diagnosis present

## 2024-08-26 DIAGNOSIS — Z79890 Hormone replacement therapy (postmenopausal): Secondary | ICD-10-CM | POA: Diagnosis not present

## 2024-08-26 DIAGNOSIS — Z7901 Long term (current) use of anticoagulants: Secondary | ICD-10-CM | POA: Diagnosis not present

## 2024-08-26 DIAGNOSIS — K8020 Calculus of gallbladder without cholecystitis without obstruction: Secondary | ICD-10-CM | POA: Insufficient documentation

## 2024-08-26 DIAGNOSIS — Z1722 Progesterone receptor negative status: Secondary | ICD-10-CM | POA: Insufficient documentation

## 2024-08-26 DIAGNOSIS — D6851 Activated protein C resistance: Secondary | ICD-10-CM | POA: Insufficient documentation

## 2024-08-26 DIAGNOSIS — Z79899 Other long term (current) drug therapy: Secondary | ICD-10-CM | POA: Diagnosis not present

## 2024-08-26 DIAGNOSIS — N281 Cyst of kidney, acquired: Secondary | ICD-10-CM | POA: Diagnosis not present

## 2024-08-29 ENCOUNTER — Encounter: Admitting: Dietician

## 2024-08-29 ENCOUNTER — Ambulatory Visit
Admission: RE | Admit: 2024-08-29 | Discharge: 2024-08-29 | Disposition: A | Source: Ambulatory Visit | Attending: Surgery | Admitting: Surgery

## 2024-08-29 DIAGNOSIS — Z853 Personal history of malignant neoplasm of breast: Secondary | ICD-10-CM

## 2024-08-29 HISTORY — PX: BREAST BIOPSY: SHX20

## 2024-08-31 NOTE — H&P (Signed)
 "  REFERRING PHYSICIAN: Mat Rosaline CROME, MD PROVIDER: VICENTA DASIE POLI, MD MRN: [id number] DOB: [birth year] DATE OF ENCOUNTER: 08/15/2024 Subjective   Chief Complaint: New Consultation (Left breast cancer/gallstones)  History of Present Illness: Amy Perkins is a 74 y.o. female who is seen today as an office consultation for evaluation of New Consultation (Left breast cancer/gallstones)  This is a 74 year old female is referred here for the recent diagnosis of left breast cancer. She is found to have a small mass in the left breast. On ultrasound it measured 9 mm. It was at the 2 o'clock position 3 cm from the nipple. Biopsy of the mass showed invasive ductal carcinoma. It was 100% ER positive, 30% PR positive, HER2 negative, and had a Ki-67 of 10%. She has factor V and factor X Leiden deficiency and is on Eliquis . She is followed by Dr. Timmy for this and he will be seeing her for the breast cancer as well. She has had no previous problems regarding her breast. There is no family history of breast cancer. She has also been recently found to have an elevated ALT on several labs. She has an ultrasound showing her to have gallstones. She reports she may have had an attack from her gallbladder several years ago which was worked up as a cardiac issue and she has had known gallstones apparently. She currently is pain-free regarding her abdomen.  Review of Systems: A complete review of systems was obtained from the patient. I have reviewed this information and discussed as appropriate with the patient. See HPI as well for other ROS.  ROS   Medical History: Past Medical History:  Diagnosis Date  Anxiety  DVT (deep venous thrombosis) (CMS/HHS-HCC)  History of cancer  Pulmonary embolism (CMS/HHS-HCC)  Thyroid  disease   There is no problem list on file for this patient.  Past Surgical History:  Procedure Laterality Date  DILATION AND CURETTAGE, DIAGNOSTIC / THERAPEUTIC  knee surgery   thumb surgery  TONSILLECTOMY    No Known Allergies  Current Outpatient Medications on File Prior to Visit  Medication Sig Dispense Refill  ALPRAZolam  (XANAX ) 0.5 MG tablet Take 0.5 mg by mouth  budesonide -formoteroL  (SYMBICORT ) 160-4.5 mcg/actuation inhaler Inhale 2 inhalations into the lungs 2 (two) times daily  citalopram  (CELEXA ) 40 MG tablet Take 40 mg by mouth once daily  ELIQUIS  5 mg tablet Take 5 mg by mouth 2 (two) times daily  levothyroxine  (SYNTHROID ) 50 MCG tablet Take 50 mcg by mouth once daily  omeprazole  (PRILOSEC) 20 MG DR capsule Take 20 mg by mouth 2 (two) times daily  simvastatin  (ZOCOR ) 40 MG tablet Take 40 mg by mouth at bedtime   No current facility-administered medications on file prior to visit.   Family History  Problem Relation Age of Onset  Stroke Mother  Hyperlipidemia (Elevated cholesterol) Mother  High blood pressure (Hypertension) Mother  Diabetes Mother  Stroke Father  Skin cancer Father  Coronary Artery Disease (Blocked arteries around heart) Father  High blood pressure (Hypertension) Brother  Hyperlipidemia (Elevated cholesterol) Brother  Diabetes Brother  Deep vein thrombosis (DVT or abnormal blood clot formation) Daughter    Social History   Tobacco Use  Smoking Status Never  Smokeless Tobacco Never    Social History   Socioeconomic History  Marital status: Divorced  Tobacco Use  Smoking status: Never  Smokeless tobacco: Never  Vaping Use  Vaping status: Never Used  Substance and Sexual Activity  Alcohol use: Yes  Alcohol/week: 2.0 - 6.0 standard drinks of  alcohol  Types: 2 - 6 Standard drinks or equivalent per week  Drug use: Never   Social Drivers of Health   Financial Resource Strain: Medium Risk (06/04/2024)  Received from [facility]  Overall Financial Resource Strain (CARDIA)  How hard is it for you to pay for the very basics like food, housing, medical care, and heating?: Somewhat hard  Food Insecurity: No Food  Insecurity (06/04/2024)  Received from [facility]  Hunger Vital Sign  Within the past 12 months, you worried that your food would run out before you got the money to buy more.: Never true  Within the past 12 months, the food you bought just didn't last and you didn't have money to get more.: Never true  Transportation Needs: No Transportation Needs (06/04/2024)  Received from [facility] - Transportation  In the past 12 months, has lack of transportation kept you from medical appointments or from getting medications?: No  In the past 12 months, has lack of transportation kept you from meetings, work, or from getting things needed for daily living?: No  Physical Activity: Sufficiently Active (06/04/2024)  Received from [facility]  Exercise Vital Sign  On average, how many days per week do you engage in moderate to strenuous exercise (like a brisk walk)?: 7 days  On average, how many minutes do you engage in exercise at this level?: 70 min  Stress: No Stress Concern Present (06/04/2024)  Received from [facility] - Occupational Stress Questionnaire  Do you feel stress - tense, restless, nervous, or anxious, or unable to sleep at night because your mind is troubled all the time - these days?: Not at all  Social Connections: Moderately Integrated (06/04/2024)  Received from [facility]  Social Connection and Isolation Panel  In a typical week, how many times do you talk on the phone with family, friends, or neighbors?: More than three times a week  How often do you get together with friends or relatives?: Three times a week  How often do you attend church or religious services?: More than 4 times per year  Do you belong to any clubs or organizations such as church groups, unions, fraternal or athletic groups, or school groups?: Yes  How often do you attend meetings of the clubs or organizations you belong to?: More than 4 times per year  Are  you married, widowed, divorced, separated, never married, or living with a partner?: Divorced   Objective:   Vitals:  08/15/24 0940  BP: 131/73  Pulse: 69  Temp: 36.8 C (98.2 F)  SpO2: 97%  Weight: 69.4 kg (153 lb)  Height: 161.3 cm (5' 3.5)  PainSc: 0-No pain   Body mass index is 26.68 kg/m.  Physical Exam   She appears well on exam  A chaperone was present for the exam  There are no palpable breast masses on either side. The nipple areolar complexes are normal. There is no axillary adenopathy on either side.  Abdomen is soft and nontender  Labs, Imaging and Diagnostic Testing: I have reviewed her notes in the electronic medical records. I have reviewed her ultrasound of the abdomen. I have also reviewed her mammograms, ultrasound, and pathology results  Assessment and Plan:   Diagnoses and all orders for this visit:  Invasive ductal carcinoma of breast, left (CMS/HHS-HCC) - Ambulatory Referral to Radiation Oncology   Cholelithiasis  At this point we will address her breast cancer first. I gave her a  copy of the pathology results and we discussed breast cancer in detail. We discussed the multidisciplinary approach to treating breast cancer including medical and radiation oncology. From a surgical standpoint we discussed breast conservation with lumpectomy versus mastectomy and the long-term results of each. She is interested in breast conservation. I next discussed proceeding with a radioactive seed guided left breast lumpectomy. I explained the surgical procedure in detail. We discussed the risks which includes but is not limited to bleeding, infection, injury to surrounding structures, the need for further surgery if margins are positive, cardiopulmonary issues with anesthesia, blood clots, postoperative recovery, excetra. She will need to stop her Eliquis  2 days preoperatively. We discussed this as well. She again already has an appointment with medical oncology on  Monday and we will get her referred to radiation oncology as well. When she has recovered from her breast surgery we can address whether or not she needs a cholecystectomy. She understands and agrees with the plans  "

## 2024-09-01 ENCOUNTER — Encounter (HOSPITAL_BASED_OUTPATIENT_CLINIC_OR_DEPARTMENT_OTHER): Payer: Self-pay | Admitting: Surgery

## 2024-09-01 ENCOUNTER — Ambulatory Visit (HOSPITAL_BASED_OUTPATIENT_CLINIC_OR_DEPARTMENT_OTHER): Admitting: Anesthesiology

## 2024-09-01 ENCOUNTER — Ambulatory Visit (HOSPITAL_BASED_OUTPATIENT_CLINIC_OR_DEPARTMENT_OTHER): Admission: RE | Admit: 2024-09-01 | Admitting: Surgery

## 2024-09-01 ENCOUNTER — Ambulatory Visit
Admission: RE | Admit: 2024-09-01 | Discharge: 2024-09-01 | Disposition: A | Source: Ambulatory Visit | Attending: Surgery | Admitting: Surgery

## 2024-09-01 ENCOUNTER — Encounter: Payer: Self-pay | Admitting: *Deleted

## 2024-09-01 ENCOUNTER — Encounter (HOSPITAL_BASED_OUTPATIENT_CLINIC_OR_DEPARTMENT_OTHER)
Admission: RE | Disposition: A | Discharge status: Home / Self Care | Payer: Self-pay | Source: Home / Self Care | Attending: Surgery

## 2024-09-01 ENCOUNTER — Other Ambulatory Visit: Payer: Self-pay

## 2024-09-01 DIAGNOSIS — Z1721 Progesterone receptor positive status: Secondary | ICD-10-CM | POA: Diagnosis not present

## 2024-09-01 DIAGNOSIS — C50912 Malignant neoplasm of unspecified site of left female breast: Secondary | ICD-10-CM

## 2024-09-01 DIAGNOSIS — Z170 Estrogen receptor positive status [ER+]: Secondary | ICD-10-CM | POA: Diagnosis not present

## 2024-09-01 DIAGNOSIS — E039 Hypothyroidism, unspecified: Secondary | ICD-10-CM | POA: Diagnosis not present

## 2024-09-01 DIAGNOSIS — Z853 Personal history of malignant neoplasm of breast: Secondary | ICD-10-CM

## 2024-09-01 DIAGNOSIS — Z1732 Human epidermal growth factor receptor 2 negative status: Secondary | ICD-10-CM | POA: Diagnosis not present

## 2024-09-01 DIAGNOSIS — Z7901 Long term (current) use of anticoagulants: Secondary | ICD-10-CM | POA: Insufficient documentation

## 2024-09-01 DIAGNOSIS — Z86718 Personal history of other venous thrombosis and embolism: Secondary | ICD-10-CM | POA: Insufficient documentation

## 2024-09-01 DIAGNOSIS — C50412 Malignant neoplasm of upper-outer quadrant of left female breast: Secondary | ICD-10-CM | POA: Insufficient documentation

## 2024-09-01 HISTORY — DX: Other pulmonary embolism without acute cor pulmonale: I26.99

## 2024-09-01 HISTORY — DX: Acute embolism and thrombosis of unspecified deep veins of unspecified lower extremity: I82.409

## 2024-09-01 HISTORY — DX: Activated protein C resistance: D68.51

## 2024-09-01 HISTORY — PX: BREAST LUMPECTOMY WITH RADIOACTIVE SEED LOCALIZATION: SHX6424

## 2024-09-01 SURGERY — BREAST LUMPECTOMY WITH RADIOACTIVE SEED LOCALIZATION
Anesthesia: General | Site: Breast | Laterality: Left

## 2024-09-01 MED ORDER — CHLORHEXIDINE GLUCONATE CLOTH 2 % EX PADS: 6.0000 | MEDICATED_PAD | Freq: Once | CUTANEOUS | Status: DC

## 2024-09-01 MED ORDER — TRAMADOL HCL 50 MG PO TABS: 50.0000 mg | ORAL_TABLET | Freq: Four times a day (QID) | ORAL | 0 refills | Status: DC | PRN

## 2024-09-01 MED ORDER — ONDANSETRON HCL 4 MG/2ML IJ SOLN
INTRAMUSCULAR | Status: DC | PRN
  Administered 2024-09-01: 4 mg via INTRAVENOUS

## 2024-09-01 MED ORDER — ACETAMINOPHEN 500 MG PO TABS
1000.0000 mg | ORAL_TABLET | Freq: Once | ORAL | Status: AC
  Administered 2024-09-01: 1000 mg via ORAL

## 2024-09-01 MED ORDER — BUPIVACAINE-EPINEPHRINE (PF) 0.5% -1:200000 IJ SOLN
INTRAMUSCULAR | Status: AC
  Filled 2024-09-01: qty 30

## 2024-09-01 MED ORDER — ACETAMINOPHEN 500 MG PO TABS: 1000.0000 mg | ORAL_TABLET | ORAL | Status: DC

## 2024-09-01 MED ORDER — ACETAMINOPHEN 500 MG PO TABS
ORAL_TABLET | ORAL | Status: AC
  Filled 2024-09-01: qty 2

## 2024-09-01 MED ORDER — BUPIVACAINE-EPINEPHRINE 0.5% -1:200000 IJ SOLN
INTRAMUSCULAR | Status: DC | PRN
  Administered 2024-09-01: 20 mL

## 2024-09-01 MED ORDER — CEFAZOLIN SODIUM-DEXTROSE 2-4 GM/100ML-% IV SOLN
2.0000 g | INTRAVENOUS | Status: AC
  Administered 2024-09-01: 2 g via INTRAVENOUS

## 2024-09-01 MED ORDER — DEXAMETHASONE SOD PHOSPHATE PF 10 MG/ML IJ SOLN
INTRAMUSCULAR | Status: DC | PRN
  Administered 2024-09-01: 10 mg via INTRAVENOUS

## 2024-09-01 MED ORDER — FENTANYL CITRATE (PF) 100 MCG/2ML IJ SOLN: 25.0000 ug | INTRAMUSCULAR | Status: DC | PRN

## 2024-09-01 MED ORDER — FENTANYL CITRATE (PF) 100 MCG/2ML IJ SOLN
INTRAMUSCULAR | Status: DC | PRN
  Administered 2024-09-01: 50 ug via INTRAVENOUS

## 2024-09-01 MED ORDER — ONDANSETRON HCL 4 MG/2ML IJ SOLN
INTRAMUSCULAR | Status: AC
  Filled 2024-09-01: qty 2

## 2024-09-01 MED ORDER — PROPOFOL 10 MG/ML IV BOLUS
INTRAVENOUS | Status: AC
  Filled 2024-09-01: qty 20

## 2024-09-01 MED ORDER — OXYCODONE HCL 5 MG PO TABS: 5.0000 mg | ORAL_TABLET | Freq: Once | ORAL | Status: DC | PRN

## 2024-09-01 MED ORDER — LACTATED RINGERS IV SOLN: INTRAVENOUS | Status: DC

## 2024-09-01 MED ORDER — OXYCODONE HCL 5 MG/5ML PO SOLN: 5.0000 mg | Freq: Once | ORAL | Status: DC | PRN

## 2024-09-01 MED ORDER — ENSURE PRE-SURGERY PO LIQD: 296.0000 mL | Freq: Once | ORAL | Status: DC

## 2024-09-01 MED ORDER — PHENYLEPHRINE HCL (PRESSORS) 10 MG/ML IV SOLN
INTRAVENOUS | Status: DC | PRN
  Administered 2024-09-01: 160 ug via INTRAVENOUS

## 2024-09-01 MED ORDER — AMISULPRIDE (ANTIEMETIC) 5 MG/2ML IV SOLN: 10.0000 mg | Freq: Once | INTRAVENOUS | Status: DC | PRN

## 2024-09-01 MED ORDER — EPHEDRINE SULFATE (PRESSORS) 25 MG/5ML IV SOSY
PREFILLED_SYRINGE | INTRAVENOUS | Status: DC | PRN
  Administered 2024-09-01: 5 mg via INTRAVENOUS

## 2024-09-01 MED ORDER — PROPOFOL 10 MG/ML IV BOLUS
INTRAVENOUS | Status: DC | PRN
  Administered 2024-09-01: 120 mg via INTRAVENOUS

## 2024-09-01 MED ORDER — LIDOCAINE 2% (20 MG/ML) 5 ML SYRINGE
INTRAMUSCULAR | Status: AC
  Filled 2024-09-01: qty 5

## 2024-09-01 MED ORDER — FENTANYL CITRATE (PF) 100 MCG/2ML IJ SOLN
INTRAMUSCULAR | Status: AC
  Filled 2024-09-01: qty 2

## 2024-09-01 MED ORDER — CEFAZOLIN SODIUM-DEXTROSE 2-4 GM/100ML-% IV SOLN
INTRAVENOUS | Status: AC
  Filled 2024-09-01: qty 100

## 2024-09-01 MED ORDER — LIDOCAINE 2% (20 MG/ML) 5 ML SYRINGE
INTRAMUSCULAR | Status: DC | PRN
  Administered 2024-09-01: 60 mg via INTRAVENOUS

## 2024-09-01 SURGICAL SUPPLY — 39 items
BINDER BREAST 3XL (GAUZE/BANDAGES/DRESSINGS) IMPLANT
BINDER BREAST LRG (GAUZE/BANDAGES/DRESSINGS) IMPLANT
BINDER BREAST MEDIUM (GAUZE/BANDAGES/DRESSINGS) IMPLANT
BINDER BREAST XLRG (GAUZE/BANDAGES/DRESSINGS) IMPLANT
BINDER BREAST XXLRG (GAUZE/BANDAGES/DRESSINGS) IMPLANT
BLADE SURG 15 STRL LF DISP TIS (BLADE) ×1 IMPLANT
CANISTER SUC SOCK COL 7IN (MISCELLANEOUS) IMPLANT
CANISTER SUCT 1200ML W/VALVE (MISCELLANEOUS) IMPLANT
CHLORAPREP W/TINT 26 (MISCELLANEOUS) ×1 IMPLANT
CLIP APPLIE 9.375 MED OPEN (MISCELLANEOUS) IMPLANT
COVER BACK TABLE 60X90IN (DRAPES) ×1 IMPLANT
COVER MAYO STAND STRL (DRAPES) ×1 IMPLANT
COVER PROBE W GEL 5X96 (DRAPES) ×1 IMPLANT
DERMABOND ADVANCED .7 DNX12 (GAUZE/BANDAGES/DRESSINGS) ×1 IMPLANT
DRAPE LAPAROSCOPIC ABDOMINAL (DRAPES) ×1 IMPLANT
DRAPE UTILITY XL STRL (DRAPES) ×1 IMPLANT
ELECTRODE REM PT RTRN 9FT ADLT (ELECTROSURGICAL) ×1 IMPLANT
GAUZE SPONGE 4X4 12PLY STRL LF (GAUZE/BANDAGES/DRESSINGS) IMPLANT
GLOVE BIOGEL PI IND STRL 7.0 (GLOVE) IMPLANT
GLOVE SURG SIGNA 7.5 PF LTX (GLOVE) ×1 IMPLANT
GLOVE SURG SS PI 6.5 STRL IVOR (GLOVE) IMPLANT
GOWN STRL REUS W/ TWL LRG LVL3 (GOWN DISPOSABLE) ×1 IMPLANT
GOWN STRL REUS W/ TWL XL LVL3 (GOWN DISPOSABLE) ×1 IMPLANT
KIT MARKER MARGIN INK (KITS) ×1 IMPLANT
NEEDLE HYPO 25X1 1.5 SAFETY (NEEDLE) ×1 IMPLANT
PACK BASIN DAY SURGERY FS (CUSTOM PROCEDURE TRAY) ×1 IMPLANT
PENCIL SMOKE EVACUATOR (MISCELLANEOUS) ×1 IMPLANT
SLEEVE SCD COMPRESS KNEE MED (STOCKING) ×1 IMPLANT
SOLN 0.9% NACL POUR BTL 1000ML (IV SOLUTION) IMPLANT
SPIKE FLUID TRANSFER (MISCELLANEOUS) IMPLANT
SPONGE T-LAP 4X18 ~~LOC~~+RFID (SPONGE) ×1 IMPLANT
SUT MNCRL AB 4-0 PS2 18 (SUTURE) ×1 IMPLANT
SUT SILK 2 0 SH (SUTURE) IMPLANT
SUT VIC AB 3-0 SH 27X BRD (SUTURE) ×1 IMPLANT
SYR CONTROL 10ML LL (SYRINGE) ×1 IMPLANT
TOWEL GREEN STERILE FF (TOWEL DISPOSABLE) ×1 IMPLANT
TRAY FAXITRON CT DISP (TRAY / TRAY PROCEDURE) ×1 IMPLANT
TUBE CONNECTING 20X1/4 (TUBING) IMPLANT
YANKAUER SUCT BULB TIP NO VENT (SUCTIONS) IMPLANT

## 2024-09-01 NOTE — Op Note (Signed)
" ° °  Amy Perkins 09/01/2024   Pre-op Diagnosis: LEFT BREAST CANCER     Post-op Diagnosis: SAME  Procedures: RADIOACTIVE SEED GUIDED LEFT BREAST LUMPECTOMY  Surgeon(s): Vernetta Berg, MD Maczis, Tonja Barban, PA-C  Anesthesia: General  Staff:  Circulator: Lelon Daphne BROCKS, RN Scrub Person: Wilmon Antonio SQUIBB, RN  Estimated Blood Loss: Minimal               Specimens: sent to path  Indications: This is a 74 year old female found to have a mass in the left breast to screen mammography in the upper outer quadrant.  Biopsy showed invasive ductal carcinoma.  The decision was made to proceed with a radioactive seed guided left breast lumpectomy  Procedure: The patient was brought to the operating room and identified the correct patient.  She was placed upon the operating table and general anesthesia was induced.  Her left breast was then prepped and draped in usual sterile fashion.  Using the neoprobe I located the radioactive seed at the 2 o'clock position in the upper outer quadrant several inches from the nipple areolar complex.  It was far enough away from the areola so I elected to make an incision over the top of the mass.  I anesthetized skin with Marcaine  and made a longitudinal incision with a scalpel.  I then dissected down into the breast tissue circumferentially with electrocautery.  Using the neoprobe I then widely dissected around the signal in all directions going all the way down to the chest wall and then completed the lumpectomy coming underneath the specimen.  Once the lipectomy specimen was removed I marked all margins with paint.  An x-ray was performed in the specimen confirming the radioactive seed and biopsy clip in the center of the specimen.  The specimen was then sent to pathology for evaluation.  We achieved hemostasis with the cautery.  I injected the lumpectomy cavity further with Marcaine .  I placed surgical clips around the periphery of the cavity for marking  purposes.  We then closed the subcutaneous tissue with interrupted 3-0 Vicryl sutures and closed the skin with a running 4-0 Monocryl.  Dermabond was then applied.  The patient tolerated the procedure well.  All the counts were correct at the end of the procedure.  The patient was placed in a breast binder.  She was then extubated in the operating room and taken in a stable condition to the recovery room.          Berg Vernetta   Date: 09/01/2024  Time: 10:01 AM    "

## 2024-09-01 NOTE — Discharge Instructions (Addendum)
 Next dose of Tylenol  due anytime after 2:15 today if needed   [employer] Office Phone Number [phone]  BREAST BIOPSY/ PARTIAL MASTECTOMY: POST OP INSTRUCTIONS  Always review your discharge instruction sheet given to you by the facility where your surgery was performed.  IF YOU HAVE DISABILITY OR FAMILY LEAVE FORMS, YOU MUST BRING THEM TO THE OFFICE FOR PROCESSING.  DO NOT GIVE THEM TO YOUR DOCTOR.  A prescription for pain medication may be given to you upon discharge.  Take your pain medication as prescribed, if needed.  If narcotic pain medicine is not needed, then you may take acetaminophen  (Tylenol ) or ibuprofen  (Advil ) as needed. Take your usually prescribed medications unless otherwise directed If you need a refill on your pain medication, please contact your pharmacy.  They will contact our office to request authorization.  Prescriptions will not be filled after 5pm or on week-ends. You should eat very light the first 24 hours after surgery, such as soup, crackers, pudding, etc.  Resume your normal diet the day after surgery. Most patients will experience some swelling and bruising in the breast.  Ice packs and a good support bra will help.  Swelling and bruising can take several days to resolve.  It is common to experience some constipation if taking pain medication after surgery.  Increasing fluid intake and taking a stool softener will usually help or prevent this problem from occurring.  A mild laxative (Milk of Magnesia or Miralax) should be taken according to package directions if there are no bowel movements after 48 hours. Unless discharge instructions indicate otherwise, you may remove your bandages 24-48 hours after surgery, and you may shower at that time.  You may have steri-strips (small skin tapes) in place directly over the incision.  These strips should be left on the skin for 7-10 days.  If your surgeon used skin glue on the incision, you may shower in 24  hours.  The glue will flake off over the next 2-3 weeks.  Any sutures or staples will be removed at the office during your follow-up visit. ACTIVITIES:  You may resume regular daily activities (gradually increasing) beginning the next day.  Wearing a good support bra or sports bra minimizes pain and swelling.  You may have sexual intercourse when it is comfortable. You may drive when you no longer are taking prescription pain medication, you can comfortably wear a seatbelt, and you can safely maneuver your car and apply brakes. RETURN TO WORK:  ______________________________________________________________________________________ Amy Perkins should see your doctor in the office for a follow-up appointment approximately two weeks after your surgery.  Your doctors nurse will typically make your follow-up appointment when she calls you with your pathology report.  Expect your pathology report 2-3 business days after your surgery.  You may call to check if you do not hear from us  after three days. OTHER INSTRUCTIONS: YOU MAY REMOVE THE BINDER AND SHOWER STARTING TOMORROW THEN WEAR WHAT MAKES YOU THE MOST COMFORTABLE ICE PACK, TYLENOL , AND IBUPROFEN  ALSO FOR PAIN NO VIGOROUS ACTIVITY FOR ONE WEEK _ DO NOT RESUME YOUR ELIQUIS  UNTIL TOMORROW______________________________________________________________________________________________ _____________________________________________________________________________________________________________________________________ _____________________________________________________________________________________________________________________________________ _____________________________________________________________________________________________________________________________________  WHEN TO CALL YOUR DOCTOR: Fever over 101.0 Nausea and/or vomiting. Extreme swelling or bruising. Continued bleeding from incision. Increased pain, redness, or drainage from the  incision.  The clinic staff is available to answer your questions during regular business hours.  Please dont hesitate to call and ask to speak to one of the nurses for clinical concerns.  If you have  a medical emergency, go to the nearest emergency room or call 911.  A surgeon from [facility] is always on call at the hospital.  For further questions, please visit [email or web address]    Post Anesthesia Home Care Instructions  Activity: Get plenty of rest for the remainder of the day. A responsible individual must stay with you for 24 hours following the procedure.  For the next 24 hours, DO NOT: -Drive a car -Advertising copywriter -Drink alcoholic beverages -Take any medication unless instructed by your physician -Make any legal decisions or sign important papers.  Meals: Start with liquid foods such as gelatin or soup. Progress to regular foods as tolerated. Avoid greasy, spicy, heavy foods. If nausea and/or vomiting occur, drink only clear liquids until the nausea and/or vomiting subsides. Call your physician if vomiting continues.  Special Instructions/Symptoms: Your throat may feel dry or sore from the anesthesia or the breathing tube placed in your throat during surgery. If this causes discomfort, gargle with warm salt water. The discomfort should disappear within 24 hours.  If you had a scopolamine patch placed behind your ear for the management of post- operative nausea and/or vomiting:  1. The medication in the patch is effective for 72 hours, after which it should be removed.  Wrap patch in a tissue and discard in the trash. Wash hands thoroughly with soap and water. 2. You may remove the patch earlier than 72 hours if you experience unpleasant side effects which may include dry mouth, dizziness or visual disturbances. 3. Avoid touching the patch. Wash your hands with soap and water after contact with the patch.

## 2024-09-01 NOTE — Progress Notes (Signed)
 Patient had L Breast Lumpectomy. Will follow for path.   Oncology Nurse Navigator Documentation     09/01/2024   10:30 AM  Oncology Nurse Navigator Flowsheets  Phase of Treatment Surgery  Surgery Actual Start Date: 09/01/2024  Navigator Follow Up Date: 09/05/2024  Navigator Follow Up Reason: Pathology  Navigator Location [facility]  Navigator Encounter Type Appt/Treatment Plan Review  Treatment Initiated Date 09/01/2024  Patient Visit Type MedOnc  Treatment Phase Active Tx  Barriers/Navigation Needs Coordination of Care;Education  Interventions None Required  Acuity Level 2-Minimal Needs (1-2 Barriers Identified)  Support Groups/Services Friends and Family  Time Spent with Patient 15

## 2024-09-01 NOTE — Transfer of Care (Signed)
 Immediate Anesthesia Transfer of Care Note  Patient: Amy Perkins  Procedure(s) Performed: BREAST LUMPECTOMY WITH RADIOACTIVE SEED LOCALIZATION (Left: Breast)  Patient Location: PACU  Anesthesia Type:General  Level of Consciousness: drowsy, patient cooperative, and responds to stimulation  Airway & Oxygen Therapy: Patient Spontanous Breathing and Patient connected to face mask oxygen  Post-op Assessment: Report given to RN and Post -op Vital signs reviewed and stable  Post vital signs: Reviewed and stable  Last Vitals:  Vitals Value Taken Time  BP    Temp    Pulse 77 09/01/24 10:20  Resp 12 09/01/24 10:20  SpO2 97 % 09/01/24 10:20  Vitals shown include unfiled device data.  Last Pain:  Vitals:   09/01/24 0801  PainSc: 0-No pain         Complications: No notable events documented.

## 2024-09-01 NOTE — Anesthesia Procedure Notes (Signed)
 Procedure Name: LMA Insertion Date/Time: 09/01/2024 9:34 AM  Performed by: Frost Kayla MATSU, CRNAPre-anesthesia Checklist: Patient identified, Emergency Drugs available, Suction available and Patient being monitored Patient Re-evaluated:Patient Re-evaluated prior to induction Oxygen Delivery Method: Circle system utilized Preoxygenation: Pre-oxygenation with 100% oxygen Induction Type: IV induction LMA: LMA inserted LMA Size: 4.0 Number of attempts: 1 Placement Confirmation: positive ETCO2 Tube secured with: Tape Dental Injury: Teeth and Oropharynx as per pre-operative assessment

## 2024-09-01 NOTE — Anesthesia Postprocedure Evaluation (Signed)
"   Anesthesia Post Note  Patient: Amy Perkins  Procedure(s) Performed: BREAST LUMPECTOMY WITH RADIOACTIVE SEED LOCALIZATION (Left: Breast)     Patient location during evaluation: PACU Anesthesia Type: General Level of consciousness: awake and alert Pain management: pain level controlled Vital Signs Assessment: post-procedure vital signs reviewed and stable Respiratory status: spontaneous breathing, nonlabored ventilation, respiratory function stable and patient connected to nasal cannula oxygen Cardiovascular status: blood pressure returned to baseline and stable Postop Assessment: no apparent nausea or vomiting Anesthetic complications: no   No notable events documented.  Last Vitals:  Vitals:   09/01/24 1045 09/01/24 1100  BP: (!) 100/58 122/63  Pulse: 63 63  Resp: 12 16  Temp:  (!) 36.4 C  SpO2: 93% 95%    Last Pain:  Vitals:   09/01/24 1045  PainSc: 0-No pain                 Foy Mungia L Lieutenant Abarca      "

## 2024-09-01 NOTE — Interval H&P Note (Signed)
 History and Physical Interval Note:no change in H and P  09/01/2024 8:06 AM  Amy Perkins  has presented today for surgery, with the diagnosis of LEFT BREAST CANCER.  The various methods of treatment have been discussed with the patient and family. After consideration of risks, benefits and other options for treatment, the patient has consented to  Procedures with comments: BREAST LUMPECTOMY WITH RADIOACTIVE SEED LOCALIZATION (Left) - LMA as a surgical intervention.  The patient's history has been reviewed, patient examined, no change in status, stable for surgery.  I have reviewed the patient's chart and labs.  Questions were answered to the patient's satisfaction.     Vicenta Poli

## 2024-09-01 NOTE — Anesthesia Preprocedure Evaluation (Addendum)
"                                    Anesthesia Evaluation  Patient identified by MRN, date of birth, ID band Patient awake    Reviewed: Allergy & Precautions, NPO status , Patient's Chart, lab work & pertinent test results  Airway Mallampati: III  TM Distance: >3 FB Neck ROM: Full    Dental no notable dental hx. (+) Teeth Intact, Dental Advisory Given   Pulmonary PE   Pulmonary exam normal breath sounds clear to auscultation       Cardiovascular + DVT  Normal cardiovascular exam Rhythm:Regular Rate:Normal     Neuro/Psych  Headaches PSYCHIATRIC DISORDERS  Depression       GI/Hepatic Neg liver ROS,GERD  ,,  Endo/Other  Hypothyroidism    Renal/GU negative Renal ROS  negative genitourinary   Musculoskeletal negative musculoskeletal ROS (+)    Abdominal   Peds  Hematology  (+) Blood dyscrasia (eliquis , Factor V leiden)   Anesthesia Other Findings   Reproductive/Obstetrics                              Anesthesia Physical Anesthesia Plan  ASA: 2  Anesthesia Plan: General   Post-op Pain Management: Tylenol  PO (pre-op)*   Induction: Intravenous  PONV Risk Score and Plan: 3 and Ondansetron , Dexamethasone  and Treatment may vary due to age or medical condition  Airway Management Planned: LMA  Additional Equipment:   Intra-op Plan:   Post-operative Plan: Extubation in OR  Informed Consent: I have reviewed the patients History and Physical, chart, labs and discussed the procedure including the risks, benefits and alternatives for the proposed anesthesia with the patient or authorized representative who has indicated his/her understanding and acceptance.     Dental advisory given  Plan Discussed with: CRNA  Anesthesia Plan Comments:          Anesthesia Quick Evaluation  "

## 2024-09-02 ENCOUNTER — Encounter (HOSPITAL_BASED_OUTPATIENT_CLINIC_OR_DEPARTMENT_OTHER): Payer: Self-pay | Admitting: Surgery

## 2024-09-04 LAB — SURGICAL PATHOLOGY

## 2024-09-05 ENCOUNTER — Encounter: Payer: Self-pay | Admitting: *Deleted

## 2024-09-05 NOTE — Progress Notes (Signed)
 Reviewed path with Dr Timmy. Patient is already scheduled for follow up on 09/22/2024. Dr Timmy would like an Oncotype Score completed.  Order placed.  Oncology Nurse Navigator Documentation     09/05/2024    7:30 AM  Oncology Nurse Navigator Flowsheets  Navigator Follow Up Date: 09/22/2024  Navigator Follow Up Reason: Follow-up Appointment  Navigator Location [facility]  Navigator Encounter Type Pathology Review  Patient Visit Type MedOnc  Treatment Phase Active Tx  Barriers/Navigation Needs Coordination of Care;Education  Interventions Other  Acuity Level 2-Minimal Needs (1-2 Barriers Identified)  Support Groups/Services Friends and Family  Time Spent with Patient 30

## 2024-09-08 ENCOUNTER — Telehealth: Payer: Self-pay | Admitting: *Deleted

## 2024-09-08 NOTE — Telephone Encounter (Signed)
 Copied from CRM [id number]. Topic: Clinical - Prescription Issue >> Sep 02, 2024  2:28 PM Devaughn RAMAN wrote: Reason for CRM: Pt is calling in regards to SYMBICORT  160-4.5 MCG/ACT inhaler medication. Pt stated her new insurance does not cover budesonide . Pt stated the Symbicort  is covered and she would like to be switched back to SYMBICORT  160-4.5 MCG/ACT inhaler which is covered by her insurance, pt would like a callback regarding this.  -----------------------------------------------------------------------------------------------------------------------------------------------  According to patient message insurance will cover name brand Symbicort , not the generic Budesonide  formoterol .  [facility].  Pharmacy was closed.

## 2024-09-10 NOTE — Telephone Encounter (Signed)
 Called and spoke with the pt  She states that she heard back from her insurance and the generic symbicort  is covered  I set her up for ov with Dr. Annella since she was overdue  Nothing further needed

## 2024-09-11 ENCOUNTER — Inpatient Hospital Stay: Admitting: Dietician

## 2024-09-11 NOTE — Progress Notes (Signed)
 Nutrition Assessment: Reached out to patient at home telephone number.    Reason for Assessment: New Patient Assessment   ASSESSMENT: Patient is 74 year old white female Stage I ductal carcinoma of the left breast - ER+/PR+/HER2- s/p lumpectomy. PMHx of thromboembolic disease. She has factor V Leiden mutation.  Pre-DM, GERD, PE, and gallstones.  She relayed some pain r/t eating, back pain and possibly r/t gall bladder.  She has been well educated with participation in Pre DM program and is physically active and hoping to return to yoga once cleared by careers adviser.  Doesn't each much seafood, beef, eggs (2 hard boiled eggs)  Breakfast: cereal, skim milk, banana, granola bar Lunch: peanut butter sandwich, skim milk coffee, Greek yogurt, apple Dinner: healthy choice dinner, or cooks on weekend (chicken, broccoli, pork chops salad), raw carrots, fruit cup (peaches, peaches)  Snack: Milk with Triscuit crackers  Nutrition Focused Physical Exam: unable to perform NFPE   Medications: MVI   Labs: 08/18/24  total pro 5.9   Anthropometrics: some gradual weight loss with Pre-DM coaching and program  Height: 63.5 Weight: 09/01/24  148.6#  BMI: 25.91   Estimated Energy Needs  Kcals: 2000-2300 Protein: 80-100 g Fluid: 2 L   NUTRITION DIAGNOSIS: Food and Nutrition Related Knowledge Deficit related to cancer and associated treatments as evidenced by no prior need for nutrition related information.   INTERVENTION:   Relayed that nutrition services are wrap around service provided at no charge and encouraged continued communication if experiencing any nutritional impact symptoms (NIS). Educated on importance of adequate nourishment with calorie and protein energy intake with nutrient dense foods when possible to maintain weight/strength and QOL.   Discussed ways to add protein to meals goal of 20-30 g per meal TID. Suggested Fair Life milk at breakfast. Emailed Nutrition Tip sheet for  Plant based protein foods and Soft moist protein foods with contact information provided.  MONITORING, EVALUATION, GOAL: weight trends, nutrition impact symptoms, PO intake, labs  Goal is weight maintenance.  Next Visit: PRN at patient or provider request.  Micheline Craven, RDN, LDN Registered Dietitian, [facility] Part Time Remote (Usual office hours: Tuesday-Thursday) Mobile: [phone]

## 2024-09-18 ENCOUNTER — Encounter (HOSPITAL_COMMUNITY): Payer: Self-pay

## 2024-09-18 ENCOUNTER — Encounter: Payer: Self-pay | Admitting: Pulmonary Disease

## 2024-09-18 ENCOUNTER — Ambulatory Visit: Admitting: Pulmonary Disease

## 2024-09-18 VITALS — BP 104/68 | HR 70 | Temp 98.0°F | Ht 63.0 in | Wt 151.0 lb

## 2024-09-18 DIAGNOSIS — R918 Other nonspecific abnormal finding of lung field: Secondary | ICD-10-CM | POA: Diagnosis not present

## 2024-09-18 DIAGNOSIS — R0609 Other forms of dyspnea: Secondary | ICD-10-CM

## 2024-09-18 MED ORDER — BUDESONIDE-FORMOTEROL FUMARATE 160-4.5 MCG/ACT IN AERO: 2.0000 | INHALATION_SPRAY | Freq: Two times a day (BID) | RESPIRATORY_TRACT | 12 refills | Status: AC

## 2024-09-18 NOTE — Patient Instructions (Signed)
 Nice to see you again  I refilled the symbicort  - keep taking as you are  Return to clinic in 1 year or sooner as needed

## 2024-09-18 NOTE — Progress Notes (Signed)
 "  @Patient  ID: Amy Perkins, female    DOB: August 10, 1951, 74 y.o.   MRN: [id number]  Chief Complaint  Patient presents with   Follow-up    Patient has no complaints. Patient states confusion with symbicort . Reports positive  progress with inhaler.    Referring provider: Mahlon Comer FORBES, MD  HPI:   74 y.o. woman whom we are seeing in in follow up for evaluation of DOE.  Most recent surgery note reviewed.  Most recent radiation oncology note reviewed.  Doing well.  Symbicort  2 puffs twice daily.  Helps shortness of breath.  She is mist that she sees notices the issue.  She does endorse intermittent chest discomfort.  Seems less severe than prior.  Recently diagnosed with gallstones, she is aware if this is referred pain from that.  She continues to follow with her surgeon.  She was diagnosed with breast cancer in the interim.  Underwent: Lumpectomy.  Awaiting start of radiation.  HPI at initial visit: Patient reports nearly 1 year history of chest discomfort or pain.  The description of this pain is certainly atypical.  No clear pattern or reliably reproducible symptoms.  Sometimes with exertion Sometimes Not.  Sometimes Pleuritic, Sometimes Not.  Sometimes Occurs at Rest.  Sometimes Worse When She Bends over.  She Has a History of PE in 2019.  She Had a CTA PE Protocol October 2022 and on My Review Interpretation Shows No PE, No Parenchymal Abnormalities to Account for Her Discomfort, No Evidence of Serositis or Pleural Effusion.  Review of systems she does endorse some radiation of pain to her mid or lumbar back.  The back pain can also occur independently of the presence of chest pain.  She is unsure if palpation of the area makes things better or worse that she has not tried this nor have other providers when evaluating the pain.  Symptoms are not improved despite escalation in PPI therapy.  GI note reviewed, feel unlikely that current symptoms are related to the gastrointestinal tract or  reflux.  She recently started a methylprednisolone  taper for Planter fasciitis.  This is greatly improved her chest pain.  She denies any atopic symptoms, seasonal allergies, cough.  No history of asthma in the past.  PMH: Hypothyroid, GERD Surgical history: Tonsillectomy Family history: Father with CAD, CVA, mother with hypertension, diabetes, CVA Social history: Never smoker, lives in [place] / Pulmonary Flowsheets:   ACT:      No data to display          MMRC:     No data to display          Epworth:      No data to display          Tests:   FENO:  No results found for: NITRICOXIDE  PFT:     No data to display          WALK:      No data to display          Imaging: Personally reviewed and as per EMR discussion this note MM Breast Surgical Specimen Result Date: 09/01/2024 CLINICAL DATA:  Status post LEFT breast lumpectomy EXAM: SPECIMEN RADIOGRAPH OF THE LEFT BREAST COMPARISON:  Previous exam(s). FINDINGS: Status post excision of the left breast. The radioactive seed and biopsy marker clip are present, completely intact, and were marked for pathology. IMPRESSION: Specimen radiograph of the left breast. Electronically Signed   By: Aliene Katha HERO.D.  On: 09/01/2024 14:16   MM LT RADIOACTIVE SEED LOC MAMMO GUIDE Result Date: 08/29/2024 CLINICAL DATA:  Biopsy proven invasive ductal carcinoma of the left breast. Radioactive seed localization prior to surgery. EXAM: MAMMOGRAPHIC GUIDED RADIOACTIVE SEED LOCALIZATION OF THE LEFT BREAST COMPARISON:  Previous exam(s). FINDINGS: Patient presents for radioactive seed localization prior to surgery. I met with the patient and we discussed the procedure of seed localization including benefits and alternatives. We discussed the high likelihood of a successful procedure. We discussed the risks of the procedure including infection, bleeding, tissue injury and further surgery. We discussed the low  dose of radioactivity involved in the procedure. Informed, written consent was given. The usual time-out protocol was performed immediately prior to the procedure. Using mammographic guidance, sterile technique, 1% lidocaine  and an I-125 radioactive seed, the ribbon shaped biopsy marker clip was localized using a lateral to medial approach. The follow-up mammogram images confirm the seed in the expected location and were marked for Dr. Vernetta. Follow-up survey of the patient confirms presence of the radioactive seed. Order number of I-125 seed:  [id number]. Total activity:  0.242 millicuries reference Date: 08/06/2024 The patient tolerated the procedure well and was released from the [facility]. She was given instructions regarding seed removal. IMPRESSION: Radioactive seed localization left breast. No apparent complications. Electronically Signed   By: Dina  Arceo M.D.   On: 08/29/2024 11:59    Lab Results: Personally reviewed CBC    Component Value Date/Time   WBC 6.6 08/18/2024 1101   WBC 5.5 06/11/2024 0955   RBC 4.60 08/18/2024 1101   HGB 14.1 08/18/2024 1101   HCT 41.7 08/18/2024 1101   PLT 172 08/18/2024 1101   PLT 203 02/08/2010 0000   MCV 90.7 08/18/2024 1101   MCH 30.7 08/18/2024 1101   MCHC 33.8 08/18/2024 1101   RDW 12.2 08/18/2024 1101   LYMPHSABS 1.8 08/18/2024 1101   MONOABS 0.4 08/18/2024 1101   EOSABS 0.1 08/18/2024 1101   BASOSABS 0.0 08/18/2024 1101    BMET    Component Value Date/Time   NA 141 08/18/2024 1101   NA 140 02/20/2023 1434   K 4.8 08/18/2024 1101   CL 106 08/18/2024 1101   CO2 28 08/18/2024 1101   GLUCOSE 133 (H) 08/18/2024 1101   GLUCOSE 56 02/08/2010 0000   BUN 16 08/18/2024 1101   BUN 15 02/20/2023 1434   CREATININE 0.66 08/18/2024 1101   CALCIUM 9.5 08/18/2024 1101   GFRNONAA >60 08/18/2024 1101   GFRAA >60 09/26/2019 1101    BNP No results found for: BNP  ProBNP No results found for: PROBNP  Specialty Problems        Pulmonary Problems   Dyspnea on exertion    No Known Allergies  Immunization History  Administered Date(s) Administered   Fluad Quad(high Dose 65+) 06/01/2022   H1N1 09/30/2008   INFLUENZA, HIGH DOSE SEASONAL PF 05/14/2020, 04/26/2023, 06/13/2024   Influenza Split 06/28/2011, 04/26/2023   Influenza Whole 05/06/2010   Influenza,inj,Quad PF,6+ Mos 07/08/2013, 07/03/2014, 06/05/2016, 04/13/2017, 04/08/2018, 04/15/2019   Influenza-Unspecified 06/22/2015, 05/27/2021, 05/21/2022, 04/22/2023   Moderna Covid-19 Fall Seasonal Vaccine 31yrs & older 04/26/2023   Moderna Covid-19 Vaccine Bivalent Booster 15yrs & up 04/27/2023   PFIZER Comirnaty(Gray Top)Covid-19 Tri-Sucrose Vaccine 01/03/2021   PFIZER(Purple Top)SARS-COV-2 Vaccination 09/27/2019, 10/22/2019, 05/24/2020   Pfizer Covid-19 Vaccine Bivalent Booster 2yrs & up 05/27/2021, 06/01/2022   Pfizer(Comirnaty)Fall Seasonal Vaccine 12 years and older 06/01/2022   Pneumococcal Conjugate-13 04/01/2015   Pneumococcal Polysaccharide-23 04/03/2016   Td  05/22/2005   Tdap 06/28/2011, 05/31/2023   Unspecified SARS-COV-2 Vaccination 04/22/2023   Zoster, Live 01/14/2014    Past Medical History:  Diagnosis Date   Breast cancer (HCC) 07/2024   Depression    DVT (deep venous thrombosis) (HCC)    Factor 5 Leiden mutation, heterozygous    Hyperlipidemia    Migraines    PE (pulmonary thromboembolism) (HCC)    Rectal bleeding 05/26/2020   Squamous cell carcinoma 09/20/2017   chest    Tobacco History: Social History   Tobacco Use  Smoking Status Never  Smokeless Tobacco Never   Counseling given: Not Answered   Continue to not smoke  Outpatient Encounter Medications as of 09/18/2024  Medication Sig   ALPRAZolam  (XANAX ) 0.5 MG tablet Take 0.25 mg by mouth See admin instructions. Take 1/2 tablet by mouth 2 times daily (morning and 3PM) , and 1 tablet at bedtime.   apixaban  (ELIQUIS ) 5 MG TABS tablet Take 1 tablet (5 mg total) by mouth 2  (two) times daily.   budesonide -formoterol  (SYMBICORT ) 160-4.5 MCG/ACT inhaler Inhale 2 puffs into the lungs in the morning and at bedtime.   citalopram  (CELEXA ) 40 MG tablet Take 1 tablet (40 mg total) by mouth daily.   levothyroxine  (SYNTHROID ) 50 MCG tablet TAKE 1 TABLET BY MOUTH EVERY DAY   Multiple Vitamin (MULTIVITAMIN ADULT PO) daily.   omeprazole  (PRILOSEC) 20 MG capsule TAKE 1 CAPSULE BY MOUTH TWICE A DAY   simvastatin  (ZOCOR ) 40 MG tablet TAKE 1 TABLET BY MOUTH EVERYDAY AT BEDTIME   traMADol  (ULTRAM ) 50 MG tablet Take 1 tablet (50 mg total) by mouth every 6 (six) hours as needed.   [DISCONTINUED] SYMBICORT  160-4.5 MCG/ACT inhaler INHALE 2 PUFFS INTO THE LUNGS TWICE A DAY   No facility-administered encounter medications on file as of 09/18/2024.     Review of Systems  Review of Systems  N/a Physical Exam  BP 104/68 (BP Location: Left Arm, Patient Position: Sitting, Cuff Size: Normal)   Pulse 70   Temp 98 F (36.7 C)   Ht 5' 3 (1.6 m)   Wt 151 lb (68.5 kg)   BMI 26.75 kg/m   Wt Readings from Last 5 Encounters:  09/18/24 151 lb (68.5 kg)  09/01/24 148 lb 9.4 oz (67.4 kg)  08/26/24 148 lb 6.4 oz (67.3 kg)  08/18/24 151 lb 12.8 oz (68.9 kg)  06/11/24 147 lb 2 oz (66.7 kg)    BMI Readings from Last 5 Encounters:  09/18/24 26.75 kg/m  09/01/24 25.91 kg/m  08/26/24 25.88 kg/m  08/18/24 26.47 kg/m  06/11/24 25.65 kg/m     Physical Exam General: Sitting up, no distress Eyes: No icterus Neck: No JVP Pulmonary: Clear bilaterally, normal work of breathing, good air excursion Cardiovascular: Regular rate and rhythm, no murmur  Assessment & Plan:   Dyspnea on exertion: Likely with underlying asthma.  Improved with Symbicort .  Chest discomfort or tightness historically felt to be related to asthma and historically well-controlled with Symbicort .  Symbicort  refilled today.  CT abnormality: bilateral lower lobe GGO confluent. CT hi res prone and supine  demonstrates resolution of this on prone images suggesting atelectasis.  No further follow-up needed.   Return in about 1 year (around 09/18/2025) for f/u Dr. Annella.   Amy JONELLE Annella, MD 09/18/2024   "

## 2024-09-22 ENCOUNTER — Inpatient Hospital Stay

## 2024-09-22 ENCOUNTER — Inpatient Hospital Stay: Admitting: Hematology & Oncology

## 2024-09-24 ENCOUNTER — Inpatient Hospital Stay: Admitting: Hematology & Oncology

## 2024-09-24 ENCOUNTER — Inpatient Hospital Stay

## 2024-09-24 VITALS — BP 111/63 | HR 67 | Temp 98.0°F | Resp 18 | Wt 148.1 lb

## 2024-09-24 DIAGNOSIS — C50412 Malignant neoplasm of upper-outer quadrant of left female breast: Secondary | ICD-10-CM | POA: Diagnosis not present

## 2024-09-24 DIAGNOSIS — Z170 Estrogen receptor positive status [ER+]: Secondary | ICD-10-CM | POA: Diagnosis not present

## 2024-09-24 DIAGNOSIS — C50411 Malignant neoplasm of upper-outer quadrant of right female breast: Secondary | ICD-10-CM

## 2024-09-24 LAB — CMP (CANCER CENTER ONLY)
ALT: 50 U/L — ABNORMAL HIGH (ref 0–44)
AST: 25 U/L (ref 15–41)
Albumin: 4.3 g/dL (ref 3.5–5.0)
Alkaline Phosphatase: 66 U/L (ref 38–126)
Anion gap: 7 (ref 5–15)
BUN: 21 mg/dL (ref 8–23)
CO2: 30 mmol/L (ref 22–32)
Calcium: 9.4 mg/dL (ref 8.9–10.3)
Chloride: 100 mmol/L (ref 98–111)
Creatinine: 0.57 mg/dL (ref 0.44–1.00)
GFR, Estimated: >60 mL/min
Glucose, Bld: 121 mg/dL — ABNORMAL HIGH (ref 70–99)
Potassium: 4.3 mmol/L (ref 3.5–5.1)
Sodium: 137 mmol/L (ref 135–145)
Total Bilirubin: 0.3 mg/dL (ref 0.0–1.2)
Total Protein: 5.9 g/dL — ABNORMAL LOW (ref 6.5–8.1)

## 2024-09-24 LAB — CBC WITH DIFFERENTIAL (CANCER CENTER ONLY)
Abs Immature Granulocytes: 0.02 10*3/uL (ref 0.00–0.07)
Basophils Absolute: 0 10*3/uL (ref 0.0–0.1)
Basophils Relative: 1 %
Eosinophils Absolute: 0.2 10*3/uL (ref 0.0–0.5)
Eosinophils Relative: 2 %
HCT: 40.7 % (ref 36.0–46.0)
Hemoglobin: 13.9 g/dL (ref 12.0–15.0)
Immature Granulocytes: 0 %
Lymphocytes Relative: 28 %
Lymphs Abs: 2.2 10*3/uL (ref 0.7–4.0)
MCH: 30.5 pg (ref 26.0–34.0)
MCHC: 34.2 g/dL (ref 30.0–36.0)
MCV: 89.5 fL (ref 80.0–100.0)
Monocytes Absolute: 0.5 10*3/uL (ref 0.1–1.0)
Monocytes Relative: 7 %
Neutro Abs: 4.8 10*3/uL (ref 1.7–7.7)
Neutrophils Relative %: 62 %
Platelet Count: 204 10*3/uL (ref 150–400)
RBC: 4.55 MIL/uL (ref 3.87–5.11)
RDW: 12.3 % (ref 11.5–15.5)
WBC Count: 7.7 10*3/uL (ref 4.0–10.5)
nRBC: 0 % (ref 0.0–0.2)

## 2024-09-24 MED ORDER — APIXABAN 5 MG PO TABS: 5.0000 mg | ORAL_TABLET | Freq: Two times a day (BID) | ORAL | 3 refills | Status: AC

## 2024-09-24 NOTE — Progress Notes (Incomplete)
 Location of Breast Cancer: Malignant neoplasm of upper-outer quadrant of left breast in female, estrogen receptor positive   Histology per Pathology Report:     Receptor Status: ER(pos), PR (pos), Her2-neu (neg), Ki-(10%)  Did patient present with symptoms (if so, please note symptoms) or was this found on screening mammography? was found on screening mammogram from 07/22/24  Past/Anticipated interventions by surgeon, if any: RADIOACTIVE SEED GUIDED LEFT BREAST LUMPECTOMY on 09/01/2024  Past/Anticipated interventions by medical oncology, if any:  Per Dr Timmy: As far as systemic therapy, I suspect she probably will need an aromatase inhibitor.  We will see about get her started on 1 after she has radiotherapy. I would think that she would not need to have any type of staging studies. Forgot to mention that she is going to have her gallbladder out from what she tells me.  Dr. Vernetta would be the doctor to do this also.  He wants to take care of the breast cancer before he does anything with the gallbladder.  Lymphedema issues, if any:  {:18581} {t:21944}   Pain issues, if any:  {:18581} {PAIN DESCRIPTION:21022940}  Skin issues if any:  SAFETY ISSUES: Prior radiation? no Pacemaker/ICD? {:18581} Possible current pregnancy? no Is the patient on methotrexate? {:18581}  Current Complaints / other details:  ***    Amy JULIANNA Frost, LPN 02/23/7972,6:94 PM

## 2024-09-24 NOTE — Progress Notes (Signed)
 " Hematology and Oncology Follow Up Visit  Amy Perkins [id number] 1950/12/29 74 y.o. 09/24/2024   Principle Diagnosis:  Stage 1A (T1bN0M0) infiltrating  ductal carcinoma of the LEFT breast - Oncotype = 6;  ER+/PR+/HER2 (1+) Factor V Leiden mutation/Prothrombin II mutation-heterozygous  Current Therapy:   Status post lumpectomy -09/01/2024 Radiation therapy -start on 10/04/2024 Femara 2.5 mg p.o. daily-start after radiation therapy Eliquis  5 mg p.o. twice daily -lifelong     Interim History:  [name] is back for follow-up.  She had a lobectomy.  This was done on 09/01/2024.  She had a left lobectomy.  The pathology report ([id number]) showed a 1.5 cm in filtrating ductal carcinoma.  All margins were negative.  The tumor measured 1.5 cm.  It was low-grade.  There is no lymphovascular invasion.  There is no perineural invasion.  Lymph nodes were not taken.  The tumor had very good prognostic markers.  Most important is the fact that the Oncotype score was only 6.  The Ki-67 was only 10%.  I think that she is just going to need antiestrogen therapy when she is done with radiotherapy.  I think she sees radiation therapy tomorrow..  She has had no issues with nausea or vomiting.  There is been no problems with pain.  She has had no cough or shortness of breath.  She has had no headache.  She has had no change in bowel or bladder habits.  She has had no leg swelling.  There is been no bleeding.  Overall, I would say that her performance status is probably ECOG 1.  Medications: Current Medications[1]  Allergies: Allergies[2]  Past Medical History, Surgical history, Social history, and Family History were reviewed and updated.  Review of Systems: Review of Systems  Constitutional: Negative.   HENT:  Negative.    Eyes: Negative.   Respiratory: Negative.    Cardiovascular: Negative.   Gastrointestinal: Negative.   Endocrine: Negative.   Genitourinary: Negative.     Musculoskeletal: Negative.   Skin: Negative.   Neurological: Negative.   Hematological: Negative.   Psychiatric/Behavioral: Negative.      Physical Exam:  weight is 148 lb 1.9 oz (67.2 kg). Her oral temperature is 98 F (36.7 C). Her blood pressure is 111/63 and her pulse is 67. Her respiration is 18 and oxygen saturation is 98%.   Wt Readings from Last 3 Encounters:  09/24/24 148 lb 1.9 oz (67.2 kg)  09/18/24 151 lb (68.5 kg)  09/01/24 148 lb 9.4 oz (67.4 kg)    Physical Exam Vitals reviewed.  Constitutional:      Comments: Her breast exam shows right breast with no masses, edema or erythema.  There is no right axillary adenopathy.  Her left breast shows a healing lumpectomy scar at about the 2 o'clock position.  This is healing nicely.  There is no erythema.  There is no tenderness.  There is no nipple discharge.  She has no left axillary adenopathy.  HENT:     Head: Normocephalic and atraumatic.  Eyes:     Pupils: Pupils are equal, round, and reactive to light.  Cardiovascular:     Rate and Rhythm: Normal rate and regular rhythm.     Heart sounds: Normal heart sounds.  Pulmonary:     Effort: Pulmonary effort is normal.     Breath sounds: Normal breath sounds.  Abdominal:     General: Bowel sounds are normal.     Palpations: Abdomen is soft.  Musculoskeletal:  General: No tenderness or deformity. Normal range of motion.     Cervical back: Normal range of motion.  Lymphadenopathy:     Cervical: No cervical adenopathy.  Skin:    General: Skin is warm and dry.     Findings: No erythema or rash.  Neurological:     Mental Status: She is alert and oriented to person, place, and time.  Psychiatric:        Behavior: Behavior normal.        Thought Content: Thought content normal.        Judgment: Judgment normal.      Lab Results  Component Value Date   WBC 7.7 09/24/2024   HGB 13.9 09/24/2024   HCT 40.7 09/24/2024   MCV 89.5 09/24/2024   PLT 204 09/24/2024      Chemistry      Component Value Date/Time   NA 141 08/18/2024 1101   NA 140 02/20/2023 1434   K 4.8 08/18/2024 1101   CL 106 08/18/2024 1101   CO2 28 08/18/2024 1101   BUN 16 08/18/2024 1101   BUN 15 02/20/2023 1434   CREATININE 0.66 08/18/2024 1101      Component Value Date/Time   CALCIUM 9.5 08/18/2024 1101   ALKPHOS 62 08/18/2024 1101   AST 33 08/18/2024 1101   ALT 62 (H) 08/18/2024 1101   BILITOT 0.3 08/18/2024 1101       Impression and Plan: [name] is a very charming 74 year old postmenopausal white female.  We have been seeing her for a few years secondary to her thromboembolic disease.  She has 2 mutations leading to thromboembolism.  She is heterozygous for the factor V Leiden mutation.  She is also heterozygous for the Prothrombin II gene mutation.  She was found to have a lump in the left breast.  This was found on mammogram.  She ultimately underwent a lumpectomy.  Again, everything looks very good for her.  I would think that the risk of recurrence is going be less than 5%.  She does not need chemotherapy.  She did needs radiation therapy.  I suspect that she might be able to have a short course of radiotherapy.  I think she sees Radiation Therapy tomorrow.  We will get her on an aromatase inhibitor after she completes her radiotherapy.  Again, the outcome for the breast cancer should be outstanding.  We will plan to get her back to see us  when she completes her radiotherapy.  At that point in time, we will then put her on Femara.   Maude JONELLE Crease, MD 2/4/20264:13 PM    [1]  Current Outpatient Medications:    ALPRAZolam  (XANAX ) 0.5 MG tablet, Take 0.25 mg by mouth See admin instructions. Take 1/2 tablet by mouth 2 times daily (morning and 3PM) , and 1 tablet at bedtime., Disp: , Rfl:    budesonide -formoterol  (SYMBICORT ) 160-4.5 MCG/ACT inhaler, Inhale 2 puffs into the lungs in the morning and at bedtime., Disp: 1 each, Rfl: 12   citalopram   (CELEXA ) 40 MG tablet, Take 1 tablet (40 mg total) by mouth daily., Disp: 30 tablet, Rfl: 5   levothyroxine  (SYNTHROID ) 50 MCG tablet, TAKE 1 TABLET BY MOUTH EVERY DAY, Disp: 90 tablet, Rfl: 1   Multiple Vitamin (MULTIVITAMIN ADULT PO), daily., Disp: , Rfl:    omeprazole  (PRILOSEC) 20 MG capsule, TAKE 1 CAPSULE BY MOUTH TWICE A DAY, Disp: 180 capsule, Rfl: 1   simvastatin  (ZOCOR ) 40 MG tablet, TAKE 1 TABLET BY MOUTH EVERYDAY  AT BEDTIME, Disp: 90 tablet, Rfl: 1   apixaban  (ELIQUIS ) 5 MG TABS tablet, Take 1 tablet (5 mg total) by mouth 2 (two) times daily., Disp: 180 tablet, Rfl: 3 [2] No Known Allergies  "

## 2024-09-25 ENCOUNTER — Other Ambulatory Visit: Payer: Medicare PPO

## 2024-09-25 ENCOUNTER — Ambulatory Visit: Payer: Medicare PPO | Admitting: Medical Oncology

## 2024-09-25 ENCOUNTER — Encounter: Payer: Self-pay | Admitting: *Deleted

## 2024-09-25 LAB — CANCER ANTIGEN 27.29: CA 27.29: 24.5 U/mL (ref 0.0–38.6)

## 2024-09-25 NOTE — Progress Notes (Signed)
 Patient doing well post surgery. She will now proceed with radiation. Her appointment with RadOnc scheduled for 09/30/2024. Once complete she will proceed with AI.   Oncology Nurse Navigator Documentation     09/25/2024    8:45 AM  Oncology Nurse Navigator Flowsheets  Planned Course of Treatment Radiation;AI  Navigator Follow Up Date: 10/02/2024  Navigator Follow Up Reason: Appointment Review  Navigator Location [facility]  Navigator Encounter Type Appt/Treatment Plan Review  Patient Visit Type MedOnc  Treatment Phase Active Tx  Barriers/Navigation Needs No Barriers At This Time  Interventions None Required  Acuity Level 1-No Barriers  Support Groups/Services Friends and Family  Time Spent with Patient 15

## 2024-09-30 ENCOUNTER — Ambulatory Visit

## 2024-09-30 ENCOUNTER — Ambulatory Visit: Admitting: Radiation Oncology

## 2024-10-01 ENCOUNTER — Ambulatory Visit: Admitting: Radiation Oncology

## 2024-11-19 ENCOUNTER — Inpatient Hospital Stay: Admitting: Hematology & Oncology

## 2024-11-19 ENCOUNTER — Inpatient Hospital Stay

## 2024-12-11 ENCOUNTER — Encounter: Admitting: Family Medicine

## 2025-05-05 ENCOUNTER — Encounter
